# Patient Record
Sex: Female | Born: 1965
Health system: Southern US, Community
[De-identification: ages and names within clinical notes are randomized; demographics above are authoritative.]

## PROBLEM LIST (undated history)

## (undated) ENCOUNTER — Emergency Department (HOSPITAL_COMMUNITY): Payer: Self-pay

## (undated) DIAGNOSIS — F32A Depression, unspecified: Secondary | ICD-10-CM

## (undated) DIAGNOSIS — R600 Localized edema: Secondary | ICD-10-CM

## (undated) DIAGNOSIS — Z973 Presence of spectacles and contact lenses: Secondary | ICD-10-CM

## (undated) DIAGNOSIS — I10 Essential (primary) hypertension: Secondary | ICD-10-CM

## (undated) DIAGNOSIS — R112 Nausea with vomiting, unspecified: Secondary | ICD-10-CM

## (undated) DIAGNOSIS — E538 Deficiency of other specified B group vitamins: Secondary | ICD-10-CM

## (undated) DIAGNOSIS — E559 Vitamin D deficiency, unspecified: Secondary | ICD-10-CM

## (undated) DIAGNOSIS — J189 Pneumonia, unspecified organism: Secondary | ICD-10-CM

## (undated) DIAGNOSIS — M549 Dorsalgia, unspecified: Secondary | ICD-10-CM

## (undated) DIAGNOSIS — Z9889 Other specified postprocedural states: Secondary | ICD-10-CM

## (undated) DIAGNOSIS — R7303 Prediabetes: Secondary | ICD-10-CM

## (undated) HISTORY — DX: Vitamin D deficiency, unspecified: E55.9

## (undated) HISTORY — DX: Essential (primary) hypertension: I10

## (undated) HISTORY — DX: Prediabetes: R73.03

## (undated) HISTORY — DX: Depression, unspecified: F32.A

## (undated) HISTORY — PX: CHOLECYSTECTOMY: SHX55

## (undated) HISTORY — DX: Deficiency of other specified B group vitamins: E53.8

## (undated) HISTORY — DX: Localized edema: R60.0

## (undated) HISTORY — PX: BREAST BIOPSY: SHX20

---
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
## 2001-09-19 ENCOUNTER — Encounter: Admission: RE | Admit: 2001-09-19 | Discharge: 2001-09-19 | Payer: Self-pay | Admitting: *Deleted

## 2004-11-18 ENCOUNTER — Emergency Department (HOSPITAL_COMMUNITY): Admission: AD | Admit: 2004-11-18 | Discharge: 2004-11-18 | Payer: Self-pay | Admitting: Family Medicine

## 2005-11-30 ENCOUNTER — Emergency Department (HOSPITAL_COMMUNITY): Admission: EM | Admit: 2005-11-30 | Discharge: 2005-11-30 | Payer: Self-pay | Admitting: Emergency Medicine

## 2009-01-24 ENCOUNTER — Emergency Department (HOSPITAL_COMMUNITY): Admission: EM | Admit: 2009-01-24 | Discharge: 2009-01-24 | Payer: Self-pay | Admitting: Emergency Medicine

## 2009-02-22 ENCOUNTER — Emergency Department (HOSPITAL_COMMUNITY): Admission: EM | Admit: 2009-02-22 | Discharge: 2009-02-22 | Payer: Self-pay | Admitting: Emergency Medicine

## 2010-10-07 ENCOUNTER — Emergency Department (HOSPITAL_COMMUNITY)
Admission: EM | Admit: 2010-10-07 | Discharge: 2010-10-07 | Disposition: A | Discharge status: Home / Self Care | Payer: Self-pay | Attending: Emergency Medicine | Admitting: Emergency Medicine

## 2010-10-07 DIAGNOSIS — N390 Urinary tract infection, site not specified: Secondary | ICD-10-CM | POA: Insufficient documentation

## 2010-10-07 DIAGNOSIS — R300 Dysuria: Secondary | ICD-10-CM | POA: Insufficient documentation

## 2010-10-07 LAB — URINALYSIS, ROUTINE W REFLEX MICROSCOPIC
Bilirubin Urine: NEGATIVE
Glucose, UA: NEGATIVE mg/dL
Nitrite: NEGATIVE
Protein, ur: 100 mg/dL — AB
Urobilinogen, UA: 4 mg/dL — ABNORMAL HIGH (ref 0.0–1.0)
pH: 7 (ref 5.0–8.0)

## 2010-10-07 LAB — PREGNANCY, URINE: Preg Test, Ur: NEGATIVE

## 2010-10-07 LAB — URINE MICROSCOPIC-ADD ON

## 2010-10-09 LAB — URINE CULTURE

## 2010-12-20 ENCOUNTER — Emergency Department (INDEPENDENT_AMBULATORY_CARE_PROVIDER_SITE_OTHER)
Admission: EM | Admit: 2010-12-20 | Discharge: 2010-12-20 | Disposition: A | Discharge status: Home / Self Care | Payer: Self-pay | Source: Home / Self Care | Attending: Emergency Medicine | Admitting: Emergency Medicine

## 2010-12-20 DIAGNOSIS — IMO0002 Reserved for concepts with insufficient information to code with codable children: Secondary | ICD-10-CM

## 2010-12-20 DIAGNOSIS — J029 Acute pharyngitis, unspecified: Secondary | ICD-10-CM

## 2010-12-20 DIAGNOSIS — M5416 Radiculopathy, lumbar region: Secondary | ICD-10-CM

## 2010-12-20 HISTORY — DX: Dorsalgia, unspecified: M54.9

## 2010-12-20 LAB — POCT RAPID STREP A: Streptococcus, Group A Screen (Direct): NEGATIVE

## 2010-12-20 MED ORDER — PREDNISONE 20 MG PO TABS: 40.0000 mg | ORAL_TABLET | Freq: Every day | ORAL | Status: AC

## 2010-12-20 MED ORDER — ACETAMINOPHEN-CODEINE #3 300-30 MG PO TABS: 1.0000 | ORAL_TABLET | Freq: Four times a day (QID) | ORAL | Status: AC | PRN

## 2010-12-20 NOTE — ED Notes (Signed)
C/o sorethroat for 2 days.  Also c/o low back pain with numbness to rt leg for 2 weeks.  Hx of "bulging disc" in her back.
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
## 2011-01-10 ENCOUNTER — Emergency Department (INDEPENDENT_AMBULATORY_CARE_PROVIDER_SITE_OTHER)
Admission: EM | Admit: 2011-01-10 | Discharge: 2011-01-10 | Disposition: A | Discharge status: Home / Self Care | Payer: Self-pay | Source: Home / Self Care | Attending: Emergency Medicine | Admitting: Emergency Medicine

## 2011-01-10 ENCOUNTER — Encounter (HOSPITAL_COMMUNITY): Payer: Self-pay

## 2011-01-10 ENCOUNTER — Emergency Department (INDEPENDENT_AMBULATORY_CARE_PROVIDER_SITE_OTHER): Payer: Self-pay

## 2011-01-10 DIAGNOSIS — R05 Cough: Secondary | ICD-10-CM

## 2011-01-10 DIAGNOSIS — J040 Acute laryngitis: Secondary | ICD-10-CM

## 2011-01-10 MED ORDER — BENZONATATE 100 MG PO CAPS: 100.0000 mg | ORAL_CAPSULE | Freq: Three times a day (TID) | ORAL | Status: AC

## 2011-01-10 NOTE — ED Notes (Signed)
C/o cough, scratchy throat, "lungs hurt to inhale", and bodyaches for 2 weeks- sx much worse the last week.  Denies known fever.

## 2011-01-10 NOTE — ED Provider Notes (Signed)
History     CSN: [id number] Arrival date & time: 01/10/2011  9:02 AM   First MD Initiated Contact with Patient 01/10/11 1005      Chief Complaint  Patient presents with  . URI    (Consider location/radiation/quality/duration/timing/severity/associated sxs/prior treatment) HPI Comments: X 2 weeks been coughing doing no better, cough not going away" NO sob, still having body aches and a sore throat"  Patient is a 45 y.o. female presenting with URI. The history is provided by the patient.  URI The primary symptoms include sore throat and cough. Primary symptoms do not include fever, wheezing, myalgias or rash. This is a new problem. The problem has not changed since onset. Symptoms associated with the illness include congestion. The illness is not associated with chills or facial pain.    Past Medical History  Diagnosis Date  . Back pain     History reviewed. No pertinent past surgical history.  No family history on file.  History  Substance Use Topics  . Smoking status: Never Smoker   . Smokeless tobacco: Not on file  . Alcohol Use: No    OB History    Grav Para Term Preterm Abortions TAB SAB Ect Mult Living                  Review of Systems  Constitutional: Negative for fever and chills.  HENT: Positive for congestion and sore throat.   Respiratory: Positive for cough. Negative for wheezing.   Musculoskeletal: Negative for myalgias.  Skin: Negative for rash.    Allergies  Review of patient's allergies indicates no known allergies.  Home Medications   Current Outpatient Rx  Name Route Sig Dispense Refill  . BENZONATATE 100 MG PO CAPS Oral Take 1 capsule (100 mg total) by mouth every 8 (eight) hours. 21 capsule 0  . BENZONATATE 100 MG PO CAPS Oral Take 1 capsule (100 mg total) by mouth every 8 (eight) hours. 21 capsule 0  . ALEVE PO Oral Take by mouth.        BP 141/76  Pulse 63  Temp(Src) 98.1 F (36.7 C) (Oral)  Resp 20  SpO2 100%  LMP  01/07/2011  Physical Exam  Nursing note and vitals reviewed. Constitutional: She appears well-developed and well-nourished. No distress.  HENT:  Head: Normocephalic.  Right Ear: No drainage.  Left Ear: No drainage.  Neck: Normal range of motion.  Pulmonary/Chest: Effort normal and breath sounds normal.  Skin: Skin is warm. No abrasion noted.    ED Course  Procedures (including critical care time)  Labs Reviewed - No data to display Dg Chest 2 View  01/10/2011  *RADIOLOGY REPORT*  Clinical Data: Cough and difficulty breathing for 1 week.  CHEST - 2 VIEW  Comparison: None.  Findings: The lungs are clear.  No pneumothorax or effusion.  Heart size is normal.  IMPRESSION: No acute disease.  Original Report Authenticated By: Bernadene Bell. D'ALESSIO, M.D.     1. Cough   2. Laryngitis acute       MDM  Cough- X 2 WEEKS No improvement requesting work note- In no respiratory distress- febrile        Jimmie Molly, MD 01/10/11 1840

## 2011-07-09 ENCOUNTER — Emergency Department (HOSPITAL_COMMUNITY)
Admission: EM | Admit: 2011-07-09 | Discharge: 2011-07-09 | Disposition: A | Discharge status: Home / Self Care | Payer: Self-pay | Attending: Emergency Medicine | Admitting: Emergency Medicine

## 2011-07-09 ENCOUNTER — Encounter (HOSPITAL_COMMUNITY): Payer: Self-pay | Admitting: *Deleted

## 2011-07-09 ENCOUNTER — Emergency Department (HOSPITAL_COMMUNITY): Payer: Self-pay

## 2011-07-09 DIAGNOSIS — D259 Leiomyoma of uterus, unspecified: Secondary | ICD-10-CM | POA: Insufficient documentation

## 2011-07-09 DIAGNOSIS — R109 Unspecified abdominal pain: Secondary | ICD-10-CM | POA: Insufficient documentation

## 2011-07-09 DIAGNOSIS — R10819 Abdominal tenderness, unspecified site: Secondary | ICD-10-CM | POA: Insufficient documentation

## 2011-07-09 DIAGNOSIS — K8020 Calculus of gallbladder without cholecystitis without obstruction: Secondary | ICD-10-CM | POA: Insufficient documentation

## 2011-07-09 LAB — COMPREHENSIVE METABOLIC PANEL
ALT: 7 U/L (ref 0–35)
AST: 11 U/L (ref 0–37)
Albumin: 3.3 g/dL — ABNORMAL LOW (ref 3.5–5.2)
Alkaline Phosphatase: 59 U/L (ref 39–117)
GFR calc Af Amer: 80 mL/min — ABNORMAL LOW (ref >90)
Glucose, Bld: 103 mg/dL — ABNORMAL HIGH (ref 70–99)
Potassium: 3 mEq/L — ABNORMAL LOW (ref 3.5–5.1)
Sodium: 139 mEq/L (ref 135–145)
Total Protein: 6.8 g/dL (ref 6.0–8.3)

## 2011-07-09 LAB — CBC
HCT: 35.6 % — ABNORMAL LOW (ref 36.0–46.0)
Hemoglobin: 11.9 g/dL — ABNORMAL LOW (ref 12.0–15.0)
RDW: 13.4 % (ref 11.5–15.5)
WBC: 7.5 10*3/uL (ref 4.0–10.5)

## 2011-07-09 LAB — URINALYSIS, ROUTINE W REFLEX MICROSCOPIC
Bilirubin Urine: NEGATIVE
Glucose, UA: NEGATIVE mg/dL
Hgb urine dipstick: NEGATIVE
Specific Gravity, Urine: 1.013 (ref 1.005–1.030)
pH: 6 (ref 5.0–8.0)

## 2011-07-09 LAB — DIFFERENTIAL
Basophils Absolute: 0.1 10*3/uL (ref 0.0–0.1)
Basophils Relative: 1 % (ref 0–1)
Lymphocytes Relative: 24 % (ref 12–46)
Monocytes Relative: 18 % — ABNORMAL HIGH (ref 3–12)
Neutro Abs: 4.1 10*3/uL (ref 1.7–7.7)
WBC Morphology: INCREASED

## 2011-07-09 LAB — PREGNANCY, URINE: Preg Test, Ur: NEGATIVE

## 2011-07-09 MED ORDER — OXYCODONE-ACETAMINOPHEN 5-325 MG PO TABS: 1.0000 | ORAL_TABLET | Freq: Four times a day (QID) | ORAL | Status: AC | PRN

## 2011-07-09 MED ORDER — KETOROLAC TROMETHAMINE 30 MG/ML IJ SOLN
30.0000 mg | Freq: Once | INTRAMUSCULAR | Status: AC
  Administered 2011-07-09: 30 mg via INTRAVENOUS
  Filled 2011-07-09: qty 1

## 2011-07-09 MED ORDER — MORPHINE SULFATE 4 MG/ML IJ SOLN
4.0000 mg | Freq: Once | INTRAMUSCULAR | Status: AC
  Administered 2011-07-09: 4 mg via INTRAVENOUS
  Filled 2011-07-09: qty 1

## 2011-07-09 MED ORDER — IOHEXOL 300 MG/ML  SOLN
20.0000 mL | INTRAMUSCULAR | Status: AC
  Administered 2011-07-09 (×2): 20 mL via ORAL

## 2011-07-09 MED ORDER — SODIUM CHLORIDE 0.9 % IV SOLN
Freq: Once | INTRAVENOUS | Status: AC
  Administered 2011-07-09: 09:00:00 via INTRAVENOUS

## 2011-07-09 MED ORDER — IOHEXOL 300 MG/ML  SOLN
100.0000 mL | Freq: Once | INTRAMUSCULAR | Status: AC | PRN
  Administered 2011-07-09: 100 mL via INTRAVENOUS

## 2011-07-09 MED ORDER — ONDANSETRON HCL 4 MG/2ML IJ SOLN
4.0000 mg | Freq: Once | INTRAMUSCULAR | Status: AC
  Administered 2011-07-09: 4 mg via INTRAVENOUS
  Filled 2011-07-09: qty 2

## 2011-07-09 NOTE — ED Notes (Signed)
Pt made aware that urine specimen is needed- specimen cup at bedside.

## 2011-07-09 NOTE — ED Notes (Signed)
Pt reports generalized abdominal pain since Thursday with nausea/vomiting/diarrhea/fever. No appetite. No OTC medications.

## 2011-07-09 NOTE — ED Provider Notes (Signed)
History     CSN: [id number]  Arrival date & time 07/09/11  0854   First MD Initiated Contact with Patient 07/09/11 0902      Chief Complaint  Patient presents with  . Abdominal Pain  . Emesis    (Consider location/radiation/quality/duration/timing/severity/associated sxs/prior treatment) Patient is a 46 y.o. female presenting with abdominal pain. The history is provided by the patient.  Abdominal Pain The primary symptoms of the illness include nausea. The primary symptoms of the illness do not include dysuria, vaginal discharge or vaginal bleeding. Episode onset: 4 days ago. The onset of the illness was gradual. The problem has been gradually worsening.  The patient states that she believes she is currently not pregnant. The patient has had a change in bowel habit. Symptoms associated with the illness do not include heartburn or constipation.    Past Medical History  Diagnosis Date  . Back pain     History reviewed. No pertinent past surgical history.  No family history on file.  History  Substance Use Topics  . Smoking status: Never Smoker   . Smokeless tobacco: Not on file  . Alcohol Use: No    OB History    Grav Para Term Preterm Abortions TAB SAB Ect Mult Living                  Review of Systems  Gastrointestinal: Positive for nausea. Negative for heartburn and constipation.  Genitourinary: Negative for dysuria, vaginal bleeding and vaginal discharge.  All other systems reviewed and are negative.    Allergies  Review of patient's allergies indicates no known allergies.  Home Medications   Current Outpatient Rx  Name Route Sig Dispense Refill  . ALEVE PO Oral Take by mouth.        Pulse 95  Temp 99.3 F (37.4 C) (Oral)  Resp 18  SpO2 96%  LMP 06/18/2011  Physical Exam  Nursing note and vitals reviewed. Constitutional: She is oriented to person, place, and time. She appears well-developed and well-nourished. No distress.  HENT:  Head:  Normocephalic and atraumatic.  Neck: Normal range of motion. Neck supple.  Cardiovascular: Normal rate and regular rhythm.  Exam reveals no gallop and no friction rub.   No murmur heard. Pulmonary/Chest: Effort normal and breath sounds normal. No respiratory distress. She has no wheezes.  Abdominal: Soft. Bowel sounds are normal. She exhibits no distension. There is no tenderness.       There is ttp in the ruq, epigastrium, luq, and llq.  No rebound or guarding is appreciated.  Bowel sounds are normoactive.  Musculoskeletal: Normal range of motion.  Neurological: She is alert and oriented to person, place, and time.  Skin: Skin is warm and dry. She is not diaphoretic.    ED Course  Procedures (including critical care time)   Labs Reviewed  CBC  DIFFERENTIAL  COMPREHENSIVE METABOLIC PANEL  LIPASE, BLOOD  URINALYSIS, ROUTINE W REFLEX MICROSCOPIC  PREGNANCY, URINE   No results found.   No diagnosis found.    MDM  The workup reveals only gallstones on the ct with no wbc, fever, or elevation of liver or pancreatic enzymes.  She is feeling better with the meds given and will be discharged to home.  She will be prescribed pain meds, surgery follow up and return prn if she worsens.        Geoffery Lyons, MD 07/09/11 1501

## 2011-07-09 NOTE — ED Notes (Signed)
MD at bedside. 

## 2011-08-06 ENCOUNTER — Encounter (INDEPENDENT_AMBULATORY_CARE_PROVIDER_SITE_OTHER): Payer: Self-pay | Admitting: Surgery

## 2011-08-06 ENCOUNTER — Ambulatory Visit (INDEPENDENT_AMBULATORY_CARE_PROVIDER_SITE_OTHER): Payer: Self-pay | Admitting: Surgery

## 2011-08-06 ENCOUNTER — Encounter (INDEPENDENT_AMBULATORY_CARE_PROVIDER_SITE_OTHER): Payer: Self-pay

## 2011-08-06 VITALS — BP 118/70 | HR 72 | Temp 97.2°F | Resp 18 | Ht 67.0 in | Wt 231.0 lb

## 2011-08-06 DIAGNOSIS — K8010 Calculus of gallbladder with chronic cholecystitis without obstruction: Secondary | ICD-10-CM | POA: Insufficient documentation

## 2011-08-06 NOTE — Patient Instructions (Signed)
Cholelithiasis Cholelithiasis (also called gallstones) is a form of gallbladder disease where gallstones form in your gallbladder. The gallbladder is a non-essential organ that stores bile made in the liver, which helps digest fats. Gallstones begin as small crystals and slowly grow into stones. Gallstone pain occurs when the gallbladder spasms, and a gallstone is blocking the duct. Pain can also occur when a stone passes out of the duct.  Women are more likely to develop gallstones than men. Other factors that increase the risk of gallbladder disease are:  Having multiple pregnancies. Physicians sometimes advise removing diseased gallbladders before future pregnancies.   Obesity.   Diets heavy in fried foods and fat.   Increasing age (older than 60).   Prolonged use of medications containing female hormones.   Diabetes mellitus.   Rapid weight loss.   Family history of gallstones (heredity).  SYMPTOMS  Feeling sick to your stomach (nauseous).   Abdominal pain.   Yellowing of the skin (jaundice).   Sudden pain. It may persist from several minutes to several hours.   Worsening pain with deep breathing or when jarred.   Fever.   Tenderness to the touch.  In some cases, when gallstones do not move into the bile duct, people have no pain or symptoms. These are called "silent" gallstones. TREATMENT In severe cases, emergency surgery may be required. HOME CARE INSTRUCTIONS   Only take over-the-counter or prescription medicines for pain, discomfort, or fever as directed by your caregiver.   Follow a low-fat diet until seen again. Fat causes the gallbladder to contract, which can result in pain.   Follow up as instructed. Attacks are almost always recurrent and surgery is usually required for permanent treatment.  SEEK IMMEDIATE MEDICAL CARE IF:   Your pain increases and is not controlled by medications.   You have an oral temperature above 102 F (38.9 C), not controlled by  medication.   You develop nausea and vomiting.  MAKE SURE YOU:   Understand these instructions.   Will watch your condition.   Will get help right away if you are not doing well or get worse.  Document Released: 01/04/2005 Document Revised: 12/28/2010 Document Reviewed: 03/09/2010 [employer] Patient Information 2012 [employer]. 

## 2011-08-06 NOTE — Progress Notes (Signed)
Subjective:     Patient ID: Tracy Meyer, female   DOB: Apr 17, 1965, 46 y.o.   MRN: [id number]  HPI  Tracy Meyer  [birth year] [id number]  Patient Care Team: Jearld Lesch as PCP - General (Specialist)  This patient is a 46 y.o.female who presents today for surgical evaluation at the request of Dr. Judd Lien, [facility] ED.   Reason for visit: Right upper quadrant abdominal pain and gallstones, question of biliary etiology.  A pleasant obese female who had an episode of severe pain.  Right upper quadrant.  Radiate to the flank.  Had nausea and vomiting with it.  It seems to happen again with food although milder attacks now.  Usually spicy foods, too.  She tends to avoid greasy foods but cooked or baked foods bother her.  Fruits and vegetables are fine.  She occasionally gets some low-grade fevers up to 100.  She denies heartburn or reflux.  Denies any history of any gastrointestinal problems.  She did have a grandfather with colon cancer in his 92s.  Not in her parents or siblings.  She's never had a colonoscopy.  Patient Active Problem List  Diagnosis  . Chronic cholecystitis with calculus    No past medical history on file.  Past Surgical History  Procedure Date  . Breast biopsy 46 years old    cyst removal    History   Social History  . Marital Status: Single    Spouse Name: N/A    Number of Children: N/A  . Years of Education: N/A   Occupational History  . Not on file.   Social History Main Topics  . Smoking status: Never Smoker   . Smokeless tobacco: Not on file  . Alcohol Use: No  . Drug Use: No  . Sexually Active:    Other Topics Concern  . Not on file   Social History Narrative  . No narrative on file    Family History  Problem Relation Age of Onset  . Colon cancer Maternal Grandmother   . Brain cancer Maternal Grandmother     Current Outpatient Prescriptions  Medication Sig Dispense Refill  . naproxen sodium (ANAPROX) 220 MG tablet Take 220 mg by  mouth daily as needed.         No Known Allergies  BP 118/70  Pulse 72  Temp 97.2 F (36.2 C) (Temporal)  Resp 18  Ht 5\' 7"  (1.702 m)  Wt 231 lb (104.781 kg)  BMI 36.18 kg/m2  LMP 05/28/2011  Ct Abdomen Pelvis W Contrast  07/09/2011  *RADIOLOGY REPORT*  Clinical Data: 46 year old female with abdominal and pelvic pain.  CT ABDOMEN AND PELVIS WITH CONTRAST  Technique:  Multidetector CT imaging of the abdomen and pelvis was performed following the standard protocol during bolus administration of intravenous contrast.  Contrast:  100 ml intravenous Omnipaque-300  Comparison: None  Findings: The liver, spleen, adrenal glands, pancreas, and kidneys are unremarkable except for bilateral renal cysts. A tiny gallstone is noted but the remainder the gallbladder is unremarkable.  No free fluid, enlarged lymph nodes, biliary dilation or abdominal aortic aneurysm identified. Shotty mesenteric lymph nodes are present and likely reactive.  The bowel, appendix and bladder are unremarkable.  Multiple uterine fibroids are identified, the largest measuring 5 cm extending off of the right uterine body/fundus.  No acute or suspicious bony abnormalities are identified.  IMPRESSION: No evidence of acute abnormality.  Shotty mesenteric lymph nodes - likely reactive.  Cholelithiasis without CT evidence of cholecystitis.  Uterine fibroids.  Original Report Authenticated By: Rosendo Gros, M.D.   '  Review of Systems  Constitutional: Negative for fever, chills, diaphoresis, appetite change and fatigue.  HENT: Negative for ear pain, sore throat, trouble swallowing, neck pain and ear discharge.   Eyes: Negative for photophobia, discharge and visual disturbance.  Respiratory: Negative for cough, choking, chest tightness and shortness of breath.   Cardiovascular: Negative for chest pain and palpitations.  Gastrointestinal: Positive for nausea, vomiting, abdominal pain and diarrhea. Negative for constipation, anal  bleeding and rectal pain.       No personal nor family history of GI/colon cancer aside from GF w colon CA in 70s, inflammatory bowel disease, irritable bowel syndrome, allergy such as Celiac Sprue, dietary/dairy problems, colitis, ulcers nor gastritis.    No recent sick contacts/gastroenteritis.  No travel outside the country.  No changes in diet.    Genitourinary: Negative for dysuria, frequency and difficulty urinating.  Musculoskeletal: Positive for myalgias and back pain. Negative for gait problem.  Skin: Negative for color change, pallor and rash.  Neurological: Negative for dizziness, speech difficulty, weakness and numbness.  Hematological: Negative for adenopathy.  Psychiatric/Behavioral: Negative for confusion and agitation. The patient is not nervous/anxious.        Objective:   Physical Exam  Constitutional: She is oriented to person, place, and time. She appears well-developed and well-nourished. No distress.  HENT:  Head: Normocephalic.  Mouth/Throat: Oropharynx is clear and moist. No oropharyngeal exudate.  Eyes: Conjunctivae and EOM are normal. Pupils are equal, round, and reactive to light. No scleral icterus.  Neck: Normal range of motion. Neck supple. No tracheal deviation present.  Cardiovascular: Normal rate, regular rhythm and intact distal pulses.   Pulmonary/Chest: Effort normal and breath sounds normal. No respiratory distress. She exhibits no tenderness.  Abdominal: Soft. Bowel sounds are normal. She exhibits no distension and no mass. There is tenderness in the right upper quadrant. There is no CVA tenderness, no tenderness at McBurney's point and negative Murphy's sign. Hernia confirmed negative in the ventral area, confirmed negative in the right inguinal area and confirmed negative in the left inguinal area.  Genitourinary: No vaginal discharge found.  Musculoskeletal: Normal range of motion. She exhibits no tenderness.  Lymphadenopathy:    She has no  cervical adenopathy.       Right: No inguinal adenopathy present.       Left: No inguinal adenopathy present.  Neurological: She is alert and oriented to person, place, and time. No cranial nerve deficit. She exhibits normal muscle tone. Coordination normal.  Skin: Skin is warm and dry. No rash noted. She is not diaphoretic. No erythema.  Psychiatric: She has a normal mood and affect. Her behavior is normal. Judgment and thought content normal.       Assessment:     Bilary colic with chronic pain,prob chronic cholecystitis    Plan:     Lap chole.  Obese but OK to try single site:  The anatomy & physiology of hepatobiliary & pancreatic function was discussed.  The pathophysiology of gallbladder dysfunction was discussed.  Natural history risks without surgery was discussed.   I feel the risks of no intervention will lead to serious problems that outweigh the operative risks; therefore, I recommended cholecystectomy to remove the pathology.  I explained laparoscopic techniques with possible need for an open approach.  Probable cholangiogram to evaluate the bilary tract was explained as well.    Risks such as bleeding, infection, abscess, leak, injury  to other organs, need for further treatment, heart attack, death, and other risks were discussed.  I noted a good likelihood this will help address the problem.  Possibility that this will not correct all abdominal symptoms was explained.  Goals of post-operative recovery were discussed as well.  We will work to minimize complications.  An educational handout further explaining the pathology and treatment options was given as well.  Questions were answered.  The patient expresses understanding & wishes to proceed with surgery.

## 2011-08-16 ENCOUNTER — Encounter (HOSPITAL_COMMUNITY): Payer: Self-pay | Admitting: Respiratory Therapy

## 2011-08-20 NOTE — Pre-Procedure Instructions (Signed)
[address]  08/20/2011   Your procedure is scheduled on:  Tuesday, August 6th.  Report to [facility] at 12:30PM  Call this number if you have problems the morning of surgery: [phone]   Remember:   Do not eat food or drink any liquid After Midnight.     Take these medicines the morning of surgery with A SIP OF WATER: None Stop taking Aspirin, NSAIDs (Naproxen, Advil, Ibuprophen), Coumadin, Plavix, Effient and Herbal medications.   Do not wear jewelry, make-up or nail polish.  Do not wear lotions, powders, or perfumes. You may wear deodorant.  Do not shave 48 hours prior to surgery. Men may shave face and neck.  Do not bring valuables to the hospital.  Contacts, dentures or bridgework may not be worn into surgery.  Leave suitcase in the car. After surgery it may be brought to your room.  For patients admitted to the hospital, checkout time is 11:00 AM the day of discharge.   Patients discharged the day of surgery will not be allowed to drive home.  Name and phone number of your driver:   Special Instructions: CHG Shower Use Special Wash: 1/2 bottle night before surgery and 1/2 bottle morning of surgery.   Please read over the following fact sheets that you were given: Pain Booklet, Coughing and Deep Breathing and Surgical Site Infection Prevention

## 2011-08-21 ENCOUNTER — Other Ambulatory Visit (HOSPITAL_COMMUNITY): Payer: Self-pay

## 2011-08-21 ENCOUNTER — Encounter (HOSPITAL_COMMUNITY): Payer: Self-pay

## 2011-08-21 ENCOUNTER — Encounter (HOSPITAL_COMMUNITY)
Admission: RE | Admit: 2011-08-21 | Discharge: 2011-08-21 | Disposition: A | Discharge status: Home / Self Care | Payer: Self-pay | Source: Ambulatory Visit | Attending: Surgery | Admitting: Surgery

## 2011-08-21 LAB — CBC
HCT: 36.2 % (ref 36.0–46.0)
Hemoglobin: 12 g/dL (ref 12.0–15.0)
MCH: 30.9 pg (ref 26.0–34.0)
MCHC: 33.1 g/dL (ref 30.0–36.0)
MCV: 93.3 fL (ref 78.0–100.0)

## 2011-08-21 LAB — BASIC METABOLIC PANEL
BUN: 10 mg/dL (ref 6–23)
CO2: 28 mEq/L (ref 19–32)
Calcium: 8.8 mg/dL (ref 8.4–10.5)
Creatinine, Ser: 0.85 mg/dL (ref 0.50–1.10)
Glucose, Bld: 97 mg/dL (ref 70–99)

## 2011-08-21 MED ORDER — CHLORHEXIDINE GLUCONATE 4 % EX LIQD: 1.0000 "application " | Freq: Once | CUTANEOUS | Status: DC

## 2011-08-28 ENCOUNTER — Encounter (HOSPITAL_COMMUNITY): Payer: Self-pay | Admitting: Anesthesiology

## 2011-08-28 ENCOUNTER — Ambulatory Visit (HOSPITAL_COMMUNITY)
Admission: RE | Admit: 2011-08-28 | Discharge: 2011-08-28 | Disposition: A | Discharge status: Home / Self Care | Payer: Self-pay | Source: Ambulatory Visit | Attending: Surgery | Admitting: Surgery

## 2011-08-28 ENCOUNTER — Ambulatory Visit (HOSPITAL_COMMUNITY): Payer: Self-pay | Admitting: Anesthesiology

## 2011-08-28 ENCOUNTER — Encounter (HOSPITAL_COMMUNITY)
Admission: RE | Disposition: A | Discharge status: Home / Self Care | Payer: Self-pay | Source: Ambulatory Visit | Attending: Surgery

## 2011-08-28 ENCOUNTER — Encounter (HOSPITAL_COMMUNITY): Payer: Self-pay | Admitting: Surgery

## 2011-08-28 DIAGNOSIS — Z01812 Encounter for preprocedural laboratory examination: Secondary | ICD-10-CM | POA: Insufficient documentation

## 2011-08-28 DIAGNOSIS — K8010 Calculus of gallbladder with chronic cholecystitis without obstruction: Secondary | ICD-10-CM | POA: Insufficient documentation

## 2011-08-28 SURGERY — LAPAROSCOPIC CHOLECYSTECTOMY SINGLE SITE
Anesthesia: General | Site: Abdomen | Wound class: Clean Contaminated

## 2011-08-28 MED ORDER — HYDROMORPHONE HCL PF 1 MG/ML IJ SOLN
INTRAMUSCULAR | Status: AC
  Administered 2011-08-28: 0.5 mg via INTRAVENOUS
  Filled 2011-08-28: qty 1

## 2011-08-28 MED ORDER — LACTATED RINGERS IV SOLN
INTRAVENOUS | Status: DC
  Administered 2011-08-28: 14:00:00 via INTRAVENOUS

## 2011-08-28 MED ORDER — GLYCOPYRROLATE 0.2 MG/ML IJ SOLN
INTRAMUSCULAR | Status: DC | PRN
  Administered 2011-08-28: 0.6 mg via INTRAVENOUS

## 2011-08-28 MED ORDER — LIDOCAINE HCL (CARDIAC) 20 MG/ML IV SOLN
INTRAVENOUS | Status: DC | PRN
  Administered 2011-08-28: 100 mg via INTRAVENOUS

## 2011-08-28 MED ORDER — FENTANYL CITRATE 0.05 MG/ML IJ SOLN
INTRAMUSCULAR | Status: DC | PRN
  Administered 2011-08-28: 50 ug via INTRAVENOUS
  Administered 2011-08-28: 100 ug via INTRAVENOUS

## 2011-08-28 MED ORDER — SODIUM CHLORIDE 0.9 % IR SOLN
Status: DC | PRN
  Administered 2011-08-28: 1000 mL

## 2011-08-28 MED ORDER — ONDANSETRON HCL 4 MG/2ML IJ SOLN: 4.0000 mg | Freq: Once | INTRAMUSCULAR | Status: DC | PRN

## 2011-08-28 MED ORDER — ACETAMINOPHEN 650 MG RE SUPP: 650.0000 mg | RECTAL | Status: DC | PRN

## 2011-08-28 MED ORDER — ONDANSETRON HCL 4 MG/2ML IJ SOLN
INTRAMUSCULAR | Status: DC | PRN
  Administered 2011-08-28: 4 mg via INTRAVENOUS

## 2011-08-28 MED ORDER — SODIUM CHLORIDE 0.9 % IJ SOLN: 3.0000 mL | INTRAMUSCULAR | Status: DC | PRN

## 2011-08-28 MED ORDER — PROPOFOL 10 MG/ML IV EMUL
INTRAVENOUS | Status: DC | PRN
  Administered 2011-08-28: 30 mg via INTRAVENOUS
  Administered 2011-08-28: 140 mg via INTRAVENOUS

## 2011-08-28 MED ORDER — OXYCODONE HCL 5 MG PO TABS
5.0000 mg | ORAL_TABLET | ORAL | Status: DC | PRN
  Administered 2011-08-28: 5 mg via ORAL

## 2011-08-28 MED ORDER — NAPROXEN SODIUM 220 MG PO TABS: 440.0000 mg | ORAL_TABLET | Freq: Two times a day (BID) | ORAL | Status: DC | PRN

## 2011-08-28 MED ORDER — CEFAZOLIN SODIUM-DEXTROSE 2-3 GM-% IV SOLR
INTRAVENOUS | Status: AC
  Filled 2011-08-28: qty 50

## 2011-08-28 MED ORDER — LACTATED RINGERS IV SOLN
INTRAVENOUS | Status: DC | PRN
  Administered 2011-08-28 (×2): via INTRAVENOUS

## 2011-08-28 MED ORDER — MIDAZOLAM HCL 5 MG/5ML IJ SOLN
INTRAMUSCULAR | Status: DC | PRN
  Administered 2011-08-28 (×2): 1 mg via INTRAVENOUS

## 2011-08-28 MED ORDER — ONDANSETRON HCL 4 MG/2ML IJ SOLN: 4.0000 mg | Freq: Four times a day (QID) | INTRAMUSCULAR | Status: DC | PRN

## 2011-08-28 MED ORDER — SODIUM CHLORIDE 0.9 % IJ SOLN: 3.0000 mL | Freq: Two times a day (BID) | INTRAMUSCULAR | Status: DC

## 2011-08-28 MED ORDER — BUPIVACAINE-EPINEPHRINE PF 0.25-1:200000 % IJ SOLN
INTRAMUSCULAR | Status: AC
  Filled 2011-08-28: qty 30

## 2011-08-28 MED ORDER — SODIUM CHLORIDE 0.9 % IV SOLN: 250.0000 mL | INTRAVENOUS | Status: DC | PRN

## 2011-08-28 MED ORDER — ACETAMINOPHEN 325 MG PO TABS: 650.0000 mg | ORAL_TABLET | ORAL | Status: DC | PRN

## 2011-08-28 MED ORDER — HYDROMORPHONE HCL PF 1 MG/ML IJ SOLN
0.2500 mg | INTRAMUSCULAR | Status: DC | PRN
  Administered 2011-08-28 (×2): 0.5 mg via INTRAVENOUS

## 2011-08-28 MED ORDER — OXYCODONE HCL 5 MG PO TABS
ORAL_TABLET | ORAL | Status: AC
  Administered 2011-08-28: 5 mg via ORAL
  Filled 2011-08-28: qty 1

## 2011-08-28 MED ORDER — ROCURONIUM BROMIDE 100 MG/10ML IV SOLN
INTRAVENOUS | Status: DC | PRN
  Administered 2011-08-28: 50 mg via INTRAVENOUS

## 2011-08-28 MED ORDER — NEOSTIGMINE METHYLSULFATE 1 MG/ML IJ SOLN
INTRAMUSCULAR | Status: DC | PRN
  Administered 2011-08-28: 4 mg via INTRAVENOUS

## 2011-08-28 MED ORDER — FENTANYL CITRATE 0.05 MG/ML IJ SOLN: 25.0000 ug | INTRAMUSCULAR | Status: DC | PRN

## 2011-08-28 MED ORDER — OXYCODONE HCL 5 MG PO TABS: 5.0000 mg | ORAL_TABLET | ORAL | Status: AC | PRN

## 2011-08-28 SURGICAL SUPPLY — 47 items
APPLIER CLIP 5 13 M/L LIGAMAX5 (MISCELLANEOUS) ×2
APR CLP MED LRG 5 ANG JAW (MISCELLANEOUS) ×1
BAG SPEC RTRVL LRG 6X4 10 (ENDOMECHANICALS)
BLADE SURG ROTATE 9660 (MISCELLANEOUS) IMPLANT
CANISTER SUCTION 2500CC (MISCELLANEOUS) ×2 IMPLANT
CHLORAPREP W/TINT 26ML (MISCELLANEOUS) ×2 IMPLANT
CLIP APPLIE 5 13 M/L LIGAMAX5 (MISCELLANEOUS) ×1 IMPLANT
CLOTH BEACON ORANGE TIMEOUT ST (SAFETY) ×2 IMPLANT
COVER MAYO STAND STRL (DRAPES) IMPLANT
COVER SURGICAL LIGHT HANDLE (MISCELLANEOUS) ×2 IMPLANT
DECANTER SPIKE VIAL GLASS SM (MISCELLANEOUS) ×2 IMPLANT
DRAPE C-ARM 42X72 X-RAY (DRAPES) IMPLANT
DRAPE WARM FLUID 44X44 (DRAPE) ×2 IMPLANT
DRSG TEGADERM 4X4.75 (GAUZE/BANDAGES/DRESSINGS) ×2 IMPLANT
ELECT REM PT RETURN 9FT ADLT (ELECTROSURGICAL) ×2
ELECTRODE REM PT RTRN 9FT ADLT (ELECTROSURGICAL) ×1 IMPLANT
ENDOLOOP SUT PDS II  0 18 (SUTURE)
ENDOLOOP SUT PDS II 0 18 (SUTURE) IMPLANT
GAUZE SPONGE 2X2 8PLY STRL LF (GAUZE/BANDAGES/DRESSINGS) ×1 IMPLANT
GLOVE BIOGEL PI IND STRL 7.5 (GLOVE) IMPLANT
GLOVE BIOGEL PI IND STRL 8 (GLOVE) ×1 IMPLANT
GLOVE BIOGEL PI INDICATOR 7.5 (GLOVE) ×2
GLOVE BIOGEL PI INDICATOR 8 (GLOVE) ×1
GLOVE ECLIPSE 8.0 STRL XLNG CF (GLOVE) ×2 IMPLANT
GOWN PREVENTION PLUS XLARGE (GOWN DISPOSABLE) ×2 IMPLANT
GOWN STRL NON-REIN LRG LVL3 (GOWN DISPOSABLE) ×4 IMPLANT
KIT BASIN OR (CUSTOM PROCEDURE TRAY) ×2 IMPLANT
KIT ROOM TURNOVER OR (KITS) ×2 IMPLANT
NEEDLE 22X1 1/2 (OR ONLY) (NEEDLE) ×2 IMPLANT
NS IRRIG 1000ML POUR BTL (IV SOLUTION) ×2 IMPLANT
PAD ARMBOARD 7.5X6 YLW CONV (MISCELLANEOUS) ×4 IMPLANT
POUCH SPECIMEN RETRIEVAL 10MM (ENDOMECHANICALS) IMPLANT
SCALPEL HARMONIC ACE (MISCELLANEOUS) ×2 IMPLANT
SCISSORS LAP 5X35 DISP (ENDOMECHANICALS) ×1 IMPLANT
SET CHOLANGIOGRAPH 5 50 .035 (SET/KITS/TRAYS/PACK) IMPLANT
SET IRRIG TUBING LAPAROSCOPIC (IRRIGATION / IRRIGATOR) ×2 IMPLANT
SPECIMEN JAR SMALL (MISCELLANEOUS) ×2 IMPLANT
SPONGE GAUZE 2X2 STER 10/PKG (GAUZE/BANDAGES/DRESSINGS) ×1
SUT MNCRL AB 4-0 PS2 18 (SUTURE) ×2 IMPLANT
SUT VICRYL 0 TIES 12 18 (SUTURE) IMPLANT
TOWEL OR 17X26 10 PK STRL BLUE (TOWEL DISPOSABLE) ×2 IMPLANT
TRAY LAPAROSCOPIC (CUSTOM PROCEDURE TRAY) ×2 IMPLANT
TROCAR 5M 150ML BLDLS (TROCAR) IMPLANT
TROCAR FALLER TUNNELING (TROCAR) ×2 IMPLANT
TROCAR XCEL NON-BLD 5MMX100MML (ENDOMECHANICALS) IMPLANT
TROCAR Z-THREAD FIOS 5X100MM (TROCAR) ×2 IMPLANT
WATER STERILE IRR 1000ML POUR (IV SOLUTION) IMPLANT

## 2011-08-28 NOTE — Anesthesia Postprocedure Evaluation (Signed)
Anesthesia Post Note  Patient: Tracy Meyer  Procedure(s) Performed: Procedure(s) (LRB): LAPAROSCOPIC CHOLECYSTECTOMY SINGLE PORT (N/A)  Anesthesia type: general  Patient location: PACU  Post pain: Pain level controlled  Post assessment: Patient's Cardiovascular Status Stable  Last Vitals:  Filed Vitals:   08/28/11 1715  BP: 134/78  Pulse: 87  Temp: 37.1 C  Resp: 22    Post vital signs: Reviewed and stable  Level of consciousness: sedated  Complications: No apparent anesthesia complications

## 2011-08-28 NOTE — Anesthesia Preprocedure Evaluation (Addendum)
Anesthesia Evaluation  Patient identified by MRN, date of birth, ID band Patient awake    Reviewed: Allergy & Precautions, H&P , NPO status , Patient's Chart, lab work & pertinent test results  Airway Mallampati: I TM Distance: >3 FB Neck ROM: Full    Dental  (+) Teeth Intact and Dental Advisory Given   Pulmonary          Cardiovascular     Neuro/Psych    GI/Hepatic   Endo/Other    Renal/GU      Musculoskeletal   Abdominal   Peds  Hematology   Anesthesia Other Findings   Reproductive/Obstetrics                          Anesthesia Physical Anesthesia Plan  ASA: II  Anesthesia Plan: General   Post-op Pain Management:    Induction: Intravenous  Airway Management Planned: Oral ETT  Additional Equipment:   Intra-op Plan:   Post-operative Plan: Extubation in OR  Informed Consent: I have reviewed the patients History and Physical, chart, labs and discussed the procedure including the risks, benefits and alternatives for the proposed anesthesia with the patient or authorized representative who has indicated his/her understanding and acceptance.     Plan Discussed with: CRNA and Surgeon  Anesthesia Plan Comments:         Anesthesia Quick Evaluation

## 2011-08-28 NOTE — Op Note (Signed)
08/28/2011  4:56 PM  PATIENT:  Tracy Meyer  46 y.o. female  Patient Care Team: Jearld Lesch as PCP - General (Specialist)  PRE-OPERATIVE DIAGNOSIS:  chronic cholecystitis with calculus  POST-OPERATIVE DIAGNOSIS:  chronic cholecystitis with calculus  PROCEDURE:  Procedure(s): LAPAROSCOPIC CHOLECYSTECTOMY SINGLE PORT  SURGEON:  Surgeon(s): Ardeth Sportsman, MD  ASSISTANT: none   ANESTHESIA:   local and general  EBL:     Delay start of Pharmacological VTE agent (>24hrs) due to surgical blood loss or risk of bleeding:  no  DRAINS: none   SPECIMEN:  Source of Specimen:  Gallbladder  DISPOSITION OF SPECIMEN:  PATHOLOGY  COUNTS:  YES  PLAN OF CARE: Discharge to home after PACU  PATIENT DISPOSITION:  PACU - hemodynamically stable.  INDICATION: Obese, pleasant female with biliary colic.  W/u otherwise negative.  I recommended cholecystectomy:  The anatomy & physiology of hepatobiliary & pancreatic function was discussed.  The pathophysiology of gallbladder dysfunction was discussed.  Natural history risks without surgery was discussed.   I feel the risks of no intervention will lead to serious problems that outweigh the operative risks; therefore, I recommended cholecystectomy to remove the pathology.  I explained laparoscopic techniques with possible need for an open approach.  Probable cholangiogram to evaluate the bilary tract was explained as well.    Risks such as bleeding, infection, abscess, leak, injury to other organs, need for further treatment, heart attack, death, and other risks were discussed.  I noted a good likelihood this will help address the problem.  Possibility that this will not correct all abdominal symptoms was explained.  Goals of post-operative recovery were discussed as well.  We will work to minimize complications.  An educational handout further explaining the pathology and treatment options was given as well.  Questions were answered.  The  patient expresses understanding & wishes to proceed with surgery.  OR FINDINGS: Thickened grey GB wall c/w chronic cholecystitis  DESCRIPTION:   The patient was identified & brought in the operating room. The patient was positioned supine with arms tucked. SCDs were active during the entire case. The patient underwent general anesthesia without any difficulty.  The abdomen was prepped and draped in a sterile fashion. A Surgical Timeout confirmed our plan.  I made a transverse curvilinear incision through the superior umbilical fold.  I placed a 5mm long port through the supraumbilical fascia using a modified Hassan cutdown technique. I began carbon dioxide insufflation. Camera inspection revealed no injury. There were no adhesions to the anterior abdominal wall supraumbilically.  I proceeded to continue with single site technique. I placed a #5 port in left upper aspect of the wound. I placed a 5 mm atraumatic grasper in the right inferior aspect of the wound.  I turned attention to the right upper quadrant.  I freed moderate omental adhesions to the anterior gallbladder.  The gallbladder fundus was elevated cephalad. I freed the peritoneal coverings between the gallbladder and the liver on the posteriolateral and anteriomedial walls. I alternated between Harmonic & blunt Maryland dissection to help get a good critical view of the cystic artery and cystic duct. I did further dissection to free a few centimeters of the  gallbladder off the liver bed to get a good critical view of the infundibulum and cystic duct. I mobilized the cystic artery; and, after getting a good 360 view, ligated the cystic artery using the Harmonic ultrasonic dissection. I skeletonized the cystic duct.  I placed a clip on the  infundibulum. I did a partial cystic duct-otomy and ensured patency. I placed a 5 [place] cholangiocatheter through a puncture site at the right subcostal ridge of the abdominal wall and directed it into the  cystic duct.  I could not get the catheter to advance, however.  The cystic duct was rather narrow & short.  I aborted cholangiogram & removed the cholangiocatheter. I placed clips on the cystic duct x4.  I completed cystic duct transection. I freed the gallbladder from its remaining attachments to the liver. I ensured hemostasis on the gallbladder fossa of the liver and elsewhere. I inspected the rest of the abdomen & detected no injury nor bleeding elsewhere.  I removed the gallbladder out the supraumbilical fascia. I closed the fascia transversely using 0 Vicryl interrupted stitches. A closed the skin using 4-0 monocryl stitch.  Sterile dressing was applied. The patient was extubated & arrived in the PACU in stable condition..  I had discussed postoperative care with the patient & her mother in the holding area.  I am about to locate the patient's family and discuss operative findings and postoperative goals / instructions.  Instructions are written in the chart as well.

## 2011-08-28 NOTE — Preoperative (Signed)
Beta Blockers   Reason not to administer Beta Blockers:Not Applicable 

## 2011-08-28 NOTE — Anesthesia Procedure Notes (Signed)
Procedure Name: Intubation Date/Time: 08/28/2011 3:55 PM Performed by: Marni Griffon Pre-anesthesia Checklist: Patient identified, Emergency Drugs available, Suction available and Patient being monitored Patient Re-evaluated:Patient Re-evaluated prior to inductionOxygen Delivery Method: Circle system utilized Preoxygenation: Pre-oxygenation with 100% oxygen Intubation Type: IV induction Ventilation: Mask ventilation without difficulty Laryngoscope Size: Mac and 3 (could have used the #4) Grade View: Grade III Tube type: Oral Tube size: 7.5 mm Number of attempts: 1 Airway Equipment and Method: Stylet Placement Confirmation: ETT inserted through vocal cords under direct vision,  breath sounds checked- equal and bilateral and positive ETCO2 Secured at: 23 (cm at teeth) cm Tube secured with: Tape Dental Injury: Teeth and Oropharynx as per pre-operative assessment

## 2011-08-28 NOTE — H&P (View-Only) (Signed)
Subjective:     Patient ID: Tracy Meyer, female   DOB: 11/17/1965, 46 y.o.   MRN: [id number]  HPI  Tracy Meyer  02/07/1965 [id number]  Patient Care Team: Dwight M Williams as PCP - General (Specialist)  This patient is a 46 y.o.female who presents today for surgical evaluation at the request of Dr. Delo, [facility].   Reason for visit: Right upper quadrant abdominal pain and gallstones, question of biliary etiology.  A pleasant obese female who had an episode of severe pain.  Right upper quadrant.  Radiate to the flank.  Had nausea and vomiting with it.  It seems to happen again with food although milder attacks now.  Usually spicy foods, too.  She tends to avoid greasy foods but cooked or baked foods bother her.  Fruits and vegetables are fine.  She occasionally gets some low-grade fevers up to 100.  She denies heartburn or reflux.  Denies any history of any gastrointestinal problems.  She did have a grandfather with colon cancer in his 70s.  Not in her parents or siblings.  She's never had a colonoscopy.  Patient Active Problem List  Diagnosis  . Chronic cholecystitis with calculus    No past medical history on file.  Past Surgical History  Procedure Date  . Breast biopsy 46 years old    cyst removal    History   Social History  . Marital Status: Single    Spouse Name: N/A    Number of Children: N/A  . Years of Education: N/A   Occupational History  . Not on file.   Social History Main Topics  . Smoking status: Never Smoker   . Smokeless tobacco: Not on file  . Alcohol Use: No  . Drug Use: No  . Sexually Active:    Other Topics Concern  . Not on file   Social History Narrative  . No narrative on file    Family History  Problem Relation Age of Onset  . Colon cancer Maternal Grandmother   . Brain cancer Maternal Grandmother     Current Outpatient Prescriptions  Medication Sig Dispense Refill  . naproxen sodium (ANAPROX) 220 MG tablet Take 220 mg by  mouth daily as needed.         No Known Allergies  BP 118/70  Pulse 72  Temp 97.2 F (36.2 C) (Temporal)  Resp 18  Ht 5' 7" (1.702 m)  Wt 231 lb (104.781 kg)  BMI 36.18 kg/m2  LMP 05/28/2011  Ct Abdomen Pelvis W Contrast  07/09/2011  *RADIOLOGY REPORT*  Clinical Data: 46-year-old female with abdominal and pelvic pain.  CT ABDOMEN AND PELVIS WITH CONTRAST  Technique:  Multidetector CT imaging of the abdomen and pelvis was performed following the standard protocol during bolus administration of intravenous contrast.  Contrast:  100 ml intravenous Omnipaque-300  Comparison: None  Findings: The liver, spleen, adrenal glands, pancreas, and kidneys are unremarkable except for bilateral renal cysts. A tiny gallstone is noted but the remainder the gallbladder is unremarkable.  No free fluid, enlarged lymph nodes, biliary dilation or abdominal aortic aneurysm identified. Shotty mesenteric lymph nodes are present and likely reactive.  The bowel, appendix and bladder are unremarkable.  Multiple uterine fibroids are identified, the largest measuring 5 cm extending off of the right uterine body/fundus.  No acute or suspicious bony abnormalities are identified.  IMPRESSION: No evidence of acute abnormality.  Shotty mesenteric lymph nodes - likely reactive.  Cholelithiasis without CT evidence of cholecystitis.    Uterine fibroids.  Original Report Authenticated By: JEFFREY T. HU, M.D.   '  Review of Systems  Constitutional: Negative for fever, chills, diaphoresis, appetite change and fatigue.  HENT: Negative for ear pain, sore throat, trouble swallowing, neck pain and ear discharge.   Eyes: Negative for photophobia, discharge and visual disturbance.  Respiratory: Negative for cough, choking, chest tightness and shortness of breath.   Cardiovascular: Negative for chest pain and palpitations.  Gastrointestinal: Positive for nausea, vomiting, abdominal pain and diarrhea. Negative for constipation, anal  bleeding and rectal pain.       No personal nor family history of GI/colon cancer aside from GF w colon CA in 70s, inflammatory bowel disease, irritable bowel syndrome, allergy such as Celiac Sprue, dietary/dairy problems, colitis, ulcers nor gastritis.    No recent sick contacts/gastroenteritis.  No travel outside the country.  No changes in diet.    Genitourinary: Negative for dysuria, frequency and difficulty urinating.  Musculoskeletal: Positive for myalgias and back pain. Negative for gait problem.  Skin: Negative for color change, pallor and rash.  Neurological: Negative for dizziness, speech difficulty, weakness and numbness.  Hematological: Negative for adenopathy.  Psychiatric/Behavioral: Negative for confusion and agitation. The patient is not nervous/anxious.        Objective:   Physical Exam  Constitutional: She is oriented to person, place, and time. She appears well-developed and well-nourished. No distress.  HENT:  Head: Normocephalic.  Mouth/Throat: Oropharynx is clear and moist. No oropharyngeal exudate.  Eyes: Conjunctivae and EOM are normal. Pupils are equal, round, and reactive to light. No scleral icterus.  Neck: Normal range of motion. Neck supple. No tracheal deviation present.  Cardiovascular: Normal rate, regular rhythm and intact distal pulses.   Pulmonary/Chest: Effort normal and breath sounds normal. No respiratory distress. She exhibits no tenderness.  Abdominal: Soft. Bowel sounds are normal. She exhibits no distension and no mass. There is tenderness in the right upper quadrant. There is no CVA tenderness, no tenderness at McBurney's point and negative Murphy's sign. Hernia confirmed negative in the ventral area, confirmed negative in the right inguinal area and confirmed negative in the left inguinal area.  Genitourinary: No vaginal discharge found.  Musculoskeletal: Normal range of motion. She exhibits no tenderness.  Lymphadenopathy:    She has no  cervical adenopathy.       Right: No inguinal adenopathy present.       Left: No inguinal adenopathy present.  Neurological: She is alert and oriented to person, place, and time. No cranial nerve deficit. She exhibits normal muscle tone. Coordination normal.  Skin: Skin is warm and dry. No rash noted. She is not diaphoretic. No erythema.  Psychiatric: She has a normal mood and affect. Her behavior is normal. Judgment and thought content normal.       Assessment:     Bilary colic with chronic pain,prob chronic cholecystitis    Plan:     Lap chole.  Obese but OK to try single site:  The anatomy & physiology of hepatobiliary & pancreatic function was discussed.  The pathophysiology of gallbladder dysfunction was discussed.  Natural history risks without surgery was discussed.   I feel the risks of no intervention will lead to serious problems that outweigh the operative risks; therefore, I recommended cholecystectomy to remove the pathology.  I explained laparoscopic techniques with possible need for an open approach.  Probable cholangiogram to evaluate the bilary tract was explained as well.    Risks such as bleeding, infection, abscess, leak, injury   to other organs, need for further treatment, heart attack, death, and other risks were discussed.  I noted a good likelihood this will help address the problem.  Possibility that this will not correct all abdominal symptoms was explained.  Goals of post-operative recovery were discussed as well.  We will work to minimize complications.  An educational handout further explaining the pathology and treatment options was given as well.  Questions were answered.  The patient expresses understanding & wishes to proceed with surgery.        

## 2011-08-28 NOTE — Anesthesia Postprocedure Evaluation (Signed)
Anesthesia Post Note  Patient: Tracy Meyer  Procedure(s) Performed: Procedure(s) (LRB): LAPAROSCOPIC CHOLECYSTECTOMY SINGLE PORT (N/A)  Anesthesia type: general  Patient location: PACU  Post pain: Pain level controlled  Post assessment: Patient's Cardiovascular Status Stable  Last Vitals:  Filed Vitals:   08/28/11 1257  BP: 147/88  Pulse: 68  Temp: 36.8 C  Resp: 18    Post vital signs: Reviewed and stable  Level of consciousness: sedated  Complications: No apparent anesthesia complications

## 2011-08-28 NOTE — Transfer of Care (Signed)
Immediate Anesthesia Transfer of Care Note  Patient: Tracy Meyer  Procedure(s) Performed: Procedure(s) (LRB): LAPAROSCOPIC CHOLECYSTECTOMY SINGLE PORT (N/A)  Patient Location: PACU  Anesthesia Type: General  Level of Consciousness: sedated  Airway & Oxygen Therapy: Patient Spontanous Breathing and Patient connected to nasal cannula oxygen  Post-op Assessment: Report given to PACU RN, Post -op Vital signs reviewed and stable and Patient moving all extremities  Post vital signs: Reviewed and stable  Complications: No apparent anesthesia complications

## 2011-08-28 NOTE — Interval H&P Note (Signed)
History and Physical Interval Note:  08/28/2011 2:58 PM  Tracy Meyer  has presented today for surgery, with the diagnosis of chronic cholelititis  The various methods of treatment have been discussed with the patient and family. After consideration of risks, benefits and other options for treatment, the patient has consented to  Procedure(s) (LRB): LAPAROSCOPIC CHOLECYSTECTOMY SINGLE PORT (N/A) as a surgical intervention .  The patient's history has been reviewed, patient examined, no change in status, stable for surgery.  I have reviewed the patient's chart and labs.  Questions were answered to the patient's satisfaction.     Indi Willhite C.

## 2011-08-29 ENCOUNTER — Telehealth (INDEPENDENT_AMBULATORY_CARE_PROVIDER_SITE_OTHER): Payer: Self-pay | Admitting: General Surgery

## 2011-08-29 ENCOUNTER — Encounter (INDEPENDENT_AMBULATORY_CARE_PROVIDER_SITE_OTHER): Payer: Self-pay | Admitting: General Surgery

## 2011-08-29 NOTE — Telephone Encounter (Signed)
Pt is POD 1 from lap chole; called for work note.  Letter generated and FAXd to Janetta HoraDaryll Drown, at [phone].  RTW will be evaluated at postop appt on 09/11/11.

## 2011-09-11 ENCOUNTER — Ambulatory Visit (INDEPENDENT_AMBULATORY_CARE_PROVIDER_SITE_OTHER): Payer: Self-pay | Admitting: Surgery

## 2011-09-11 ENCOUNTER — Encounter (INDEPENDENT_AMBULATORY_CARE_PROVIDER_SITE_OTHER): Payer: Self-pay | Admitting: Surgery

## 2011-09-11 VITALS — BP 138/86 | HR 68 | Temp 97.2°F | Resp 16 | Ht 66.0 in | Wt 230.2 lb

## 2011-09-11 DIAGNOSIS — K8010 Calculus of gallbladder with chronic cholecystitis without obstruction: Secondary | ICD-10-CM

## 2011-09-11 NOTE — Patient Instructions (Signed)
Managing Pain  Pain after surgery or related to activity is often due to strain/injury to muscle, tendon, nerves and/or incisions.  This pain is usually short-term and will improve in a few months.   Many people find it helpful to do the following things TOGETHER to help speed the process of healing and to get back to regular activity more quickly:  1. Avoid heavy physical activity a.  no lifting greater than 20 pounds b. Do not "push through" the pain.  Listen to your body and avoid positions and maneuvers than reproduce the pain c. Walking is okay as tolerated, but go slowly and stop when getting sore.  d. Remember: If it hurts to do it, then don't do it! 2. Take Anti-inflammatory medication  a. Take with food/snack around the clock for 1-2 weeks i. This helps the muscle and nerve tissues become less irritable and calm down faster b. Choose ONE of the following over-the-counter medications: i. Naproxen 220mg tabs (ex. Aleve) 1-2 pills twice a day  ii. Ibuprofen 200mg tabs (ex. Advil, Motrin) 3-4 pills with every meal and just before bedtime iii. Acetaminophen 500mg tabs (Tylenol) 1-2 pills with every meal and just before bedtime 3. Use a Heating pad or Ice/Cold Pack a. 4-6 times a day b. May use warm bath/hottub  or showers 4. Try Gentle Massage and/or Stretching  a. at the area of pain many times a day b. stop if you feel pain - do not overdo it  Try these steps together to help you body heal faster and avoid making things get worse.  Doing just one of these things may not be enough.    If you are not getting better after two weeks or are noticing you are getting worse, contact our office for further advice; we may need to re-evaluate you & see what other things we can do to help.  GETTING TO GOOD BOWEL HEALTH. Irregular bowel habits such as constipation and diarrhea can lead to many problems over time.  Having one soft bowel movement a day is the most important way to prevent  further problems.  The anorectal canal is designed to handle stretching and feces to safely manage our ability to get rid of solid waste (feces, poop, stool) out of our body.  BUT, hard constipated stools can act like ripping concrete bricks and diarrhea can be a burning fire to this very sensitive area of our body, causing inflamed hemorrhoids, anal fissures, increasing risk is perirectal abscesses, abdominal pain/bloating, an making irritable bowel worse.     The goal: ONE SOFT BOWEL MOVEMENT A DAY!  To have soft, regular bowel movements:    Drink at least 8 tall glasses of water a day.     Take plenty of fiber.  Fiber is the undigested part of plant food that passes into the colon, acting s "natures broom" to encourage bowel motility and movement.  Fiber can absorb and hold large amounts of water. This results in a larger, bulkier stool, which is soft and easier to pass. Work gradually over several weeks up to 6 servings a day of fiber (25g a day even more if needed) in the form of: o Vegetables -- Root (potatoes, carrots, turnips), leafy green (lettuce, salad greens, celery, spinach), or cooked high residue (cabbage, broccoli, etc) o Fruit -- Fresh (unpeeled skin & pulp), Dried (prunes, apricots, cherries, etc ),  or stewed ( applesauce)  o Whole grain breads, pasta, etc (whole wheat)  o Bran cereals      Bulking Agents -- This type of water-retaining fiber generally is easily obtained each day by one of the following:  o Psyllium bran -- The psyllium plant is remarkable because its ground seeds can retain so much water. This product is available as Metamucil, Konsyl, Effersyllium, Per Diem Fiber, or the less expensive generic preparation in drug and health food stores. Although labeled a laxative, it really is not a laxative.  o Methylcellulose -- This is another fiber derived from wood which also retains water. It is available as Citrucel. o Polyethylene Glycol - and "artificial" fiber commonly called  Miralax or Glycolax.  It is helpful for people with gassy or bloated feelings with regular fiber o Flax Seed - a less gassy fiber than psyllium   No reading or other relaxing activity while on the toilet. If bowel movements take longer than 5 minutes, you are too constipated   AVOID CONSTIPATION.  High fiber and water intake usually takes care of this.  Sometimes a laxative is needed to stimulate more frequent bowel movements, but    Laxatives are not a good long-term solution as it can wear the colon out. o Osmotics (Milk of Magnesia, Fleets phosphosoda, Magnesium citrate, MiraLax, GoLytely) are safer than  o Stimulants (Senokot, Castor Oil, Dulcolax, Ex Lax)    o Do not take laxatives for more than 7days in a row.    IF SEVERELY CONSTIPATED, try a Bowel Retraining Program: o Do not use laxatives.  o Eat a diet high in roughage, such as bran cereals and leafy vegetables.  o Drink six (6) ounces of prune or apricot juice each morning.  o Eat two (2) large servings of stewed fruit each day.  o Take one (1) heaping tablespoon of a psyllium-based bulking agent twice a day. Use sugar-free sweetener when possible to avoid excessive calories.  o Eat a normal breakfast.  o Set aside 15 minutes after breakfast to sit on the toilet, but do not strain to have a bowel movement.  o If you do not have a bowel movement by the third day, use an enema and repeat the above steps.    Controlling diarrhea o Switch to liquids and simpler foods for a few days to avoid stressing your intestines further. o Avoid dairy products (especially milk & ice cream) for a short time.  The intestines often can lose the ability to digest lactose when stressed. o Avoid foods that cause gassiness or bloating.  Typical foods include beans and other legumes, cabbage, broccoli, and dairy foods.  Every person has some sensitivity to other foods, so listen to our body and avoid those foods that trigger problems for you. o Adding fiber  (Citrucel, Metamucil, psyllium, Miralax) gradually can help thicken stools by absorbing excess fluid and retrain the intestines to act more normally.  Slowly increase the dose over a few weeks.  Too much fiber too soon can backfire and cause cramping & bloating. o Probiotics (such as active yogurt, Align, etc) may help repopulate the intestines and colon with normal bacteria and calm down a sensitive digestive tract.  Most studies show it to be of mild help, though, and such products can be costly. o Medicines:   Bismuth subsalicylate (ex. Kayopectate, Pepto Bismol) every 30 minutes for up to 6 doses can help control diarrhea.  Avoid if pregnant.   Loperamide (Immodium) can slow down diarrhea.  Start with two tablets (4mg total) first and then try one tablet every 6 hours.  Avoid if you   are having fevers or severe pain.  If you are not better or start feeling worse, stop all medicines and call your doctor for advice o Call your doctor if you are getting worse or not better.  Sometimes further testing (cultures, endoscopy, X-ray studies, bloodwork, etc) may be needed to help diagnose and treat the cause of the diarrhea. o  

## 2011-09-11 NOTE — Progress Notes (Signed)
Subjective:     Patient ID: Tracy Meyer, female   DOB: 03-01-1965, 46 y.o.   MRN: [id number]  HPI  [facility]  1965/10/05 [id number]  Patient Care Team: Jearld Lesch as PCP - General (Specialist)  This patient is a 46 y.o.female who presents today for surgical evaluation.   Procedure: Single site laparoscopic cholecystectomy 08/28/2011  Diagnosis Gallbladder - CHRONIC CHOLECYSTITIS. - CHOLELITHIASIS. - ONE BENIGN LYMPH NODE (0/1).  The patient comes in today feeling all right.  Some soreness and pinching when she bends over but mild.  Off narcotics.  Eating well.  Tries to avoid heavier foods that she tends to get loose bowel stools and it "runs right through me" lost some weight but gaining it back.  Energy level better.  Overall much improved.  Wanted to get back to work what wanting to know when it states and she works for [employer] and has to do a lot of heavy lifting  Patient Active Problem List  Diagnosis  . Chronic cholecystitis with calculus    History reviewed. No pertinent past medical history.  Past Surgical History  Procedure Date  . Breast biopsy 46 years old    cyst removal    History   Social History  . Marital Status: Single    Spouse Name: N/A    Number of Children: N/A  . Years of Education: N/A   Occupational History  . Not on file.   Social History Main Topics  . Smoking status: Never Smoker   . Smokeless tobacco: Never Used  . Alcohol Use: No  . Drug Use: No  . Sexually Active: Not on file   Other Topics Concern  . Not on file   Social History Narrative  . No narrative on file    Family History  Problem Relation Age of Onset  . Colon cancer Maternal Grandmother   . Brain cancer Maternal Grandmother     Current Outpatient Prescriptions  Medication Sig Dispense Refill  . COD LIVER OIL PO Take 1 capsule by mouth daily.      . Cyanocobalamin (VITAMIN B-12 PO) Take 1 tablet by mouth daily.      . naproxen sodium (ANAPROX)  220 MG tablet Take 2 tablets (440 mg total) by mouth 2 (two) times daily as needed. For pain  30 tablet  2  . Pyridoxine HCl (VITAMIN B-6 PO) Take 1 tablet by mouth daily.      Marland Kitchen VITAMIN E PO Take 1 capsule by mouth daily.         No Known Allergies  BP 138/86  Pulse 68  Temp 97.2 F (36.2 C) (Temporal)  Resp 16  Ht 5\' 6"  (1.676 m)  Wt 230 lb 3.2 oz (104.418 kg)  BMI 37.16 kg/m2  LMP 07/22/2011  No results found.   Review of Systems  Constitutional: Negative for fever, chills and diaphoresis.  HENT: Negative for ear pain, sore throat and trouble swallowing.   Eyes: Negative for photophobia and visual disturbance.  Respiratory: Negative for cough and choking.   Cardiovascular: Negative for chest pain and palpitations.  Gastrointestinal: Positive for abdominal pain and diarrhea. Negative for nausea, vomiting, constipation, abdominal distention, anal bleeding and rectal pain.  Genitourinary: Negative for dysuria, frequency and difficulty urinating.  Musculoskeletal: Negative for myalgias and gait problem.  Skin: Negative for color change, pallor and rash.  Neurological: Negative for dizziness, speech difficulty, weakness and numbness.  Hematological: Negative for adenopathy.  Psychiatric/Behavioral: Negative for confusion and  agitation. The patient is not nervous/anxious.        Objective:   Physical Exam  Constitutional: She is oriented to person, place, and time. She appears well-developed and well-nourished. No distress.  HENT:  Head: Normocephalic.  Mouth/Throat: Oropharynx is clear and moist. No oropharyngeal exudate.  Eyes: Conjunctivae and EOM are normal. Pupils are equal, round, and reactive to light. No scleral icterus.  Neck: Normal range of motion. No tracheal deviation present.  Cardiovascular: Normal rate and intact distal pulses.   Pulmonary/Chest: Effort normal. No respiratory distress. She exhibits no tenderness.  Abdominal: Soft. She exhibits no distension.  There is no tenderness. Hernia confirmed negative in the right inguinal area and confirmed negative in the left inguinal area.       Incisions clean with normal healing ridges.  No hernias  Genitourinary: No vaginal discharge found.  Musculoskeletal: Normal range of motion. She exhibits no tenderness.  Lymphadenopathy:       Right: No inguinal adenopathy present.       Left: No inguinal adenopathy present.  Neurological: She is alert and oriented to person, place, and time. No cranial nerve deficit. She exhibits normal muscle tone. Coordination normal.  Skin: Skin is warm and dry. No rash noted. She is not diaphoretic.  Psychiatric: She has a normal mood and affect. Her behavior is normal.       Assessment:     2 weeks s/p urgent lap chole, gradually recovering    Plan:     Increase activity as tolerated.  Do not push through pain.  Advanced on diet as tolerated. Bowel regimen to avoid problems.  Try low-dose fiber and antidiarrheals as needed.  Should improve over the next few months.  Stick to a low-fat diet until loose bowel movements resolve  Okay to return to work light duty next week 26Aug.  Unrestricted activity the following week after [place]  Return to clinic p.r.n. The patient expressed understanding and appreciation

## 2012-01-04 ENCOUNTER — Encounter (HOSPITAL_COMMUNITY): Payer: Self-pay | Admitting: Emergency Medicine

## 2012-01-04 DIAGNOSIS — R109 Unspecified abdominal pain: Secondary | ICD-10-CM | POA: Insufficient documentation

## 2012-01-04 DIAGNOSIS — Z9889 Other specified postprocedural states: Secondary | ICD-10-CM | POA: Insufficient documentation

## 2012-01-04 DIAGNOSIS — N390 Urinary tract infection, site not specified: Secondary | ICD-10-CM | POA: Insufficient documentation

## 2012-01-04 DIAGNOSIS — Z79899 Other long term (current) drug therapy: Secondary | ICD-10-CM | POA: Insufficient documentation

## 2012-01-04 DIAGNOSIS — N949 Unspecified condition associated with female genital organs and menstrual cycle: Secondary | ICD-10-CM | POA: Insufficient documentation

## 2012-01-04 DIAGNOSIS — Z3202 Encounter for pregnancy test, result negative: Secondary | ICD-10-CM | POA: Insufficient documentation

## 2012-01-04 LAB — URINALYSIS, ROUTINE W REFLEX MICROSCOPIC
Ketones, ur: NEGATIVE mg/dL
Nitrite: NEGATIVE
Protein, ur: NEGATIVE mg/dL
Urobilinogen, UA: 1 mg/dL (ref 0.0–1.0)

## 2012-01-04 NOTE — ED Notes (Signed)
C/o vaginal pain, R flank pain, and increased urination since this morning. Denies nausea and vomiting.

## 2012-01-05 ENCOUNTER — Emergency Department (HOSPITAL_COMMUNITY)
Admission: EM | Admit: 2012-01-05 | Discharge: 2012-01-05 | Disposition: A | Discharge status: Home / Self Care | Payer: Self-pay | Attending: Emergency Medicine | Admitting: Emergency Medicine

## 2012-01-05 DIAGNOSIS — N390 Urinary tract infection, site not specified: Secondary | ICD-10-CM

## 2012-01-05 MED ORDER — KETOROLAC TROMETHAMINE 60 MG/2ML IM SOLN
60.0000 mg | Freq: Once | INTRAMUSCULAR | Status: AC
  Administered 2012-01-05: 60 mg via INTRAMUSCULAR
  Filled 2012-01-05: qty 2

## 2012-01-05 MED ORDER — SULFAMETHOXAZOLE-TRIMETHOPRIM 800-160 MG PO TABS: 1.0000 | ORAL_TABLET | Freq: Two times a day (BID) | ORAL | Status: DC

## 2012-01-05 MED ORDER — SULFAMETHOXAZOLE-TMP DS 800-160 MG PO TABS
1.0000 | ORAL_TABLET | Freq: Once | ORAL | Status: AC
  Administered 2012-01-05: 1 via ORAL
  Filled 2012-01-05: qty 1

## 2012-01-05 MED ORDER — NAPROXEN 500 MG PO TABS: 500.0000 mg | ORAL_TABLET | Freq: Two times a day (BID) | ORAL | Status: DC

## 2012-01-05 NOTE — ED Provider Notes (Signed)
History     CSN: [id number]  Arrival date & time 01/04/12  2224   First MD Initiated Contact with Patient 01/05/12 0128      Chief Complaint  Patient presents with  . Flank Pain  . Vaginal Pain    (Consider location/radiation/quality/duration/timing/severity/associated sxs/prior treatment) HPI Comments: 46 year old female who presents with dysuria, suprapubic discomfort over the last several days which is gradually getting worse, not associated with fevers or nausea. The pain is now in the right flank as well. This seems to get worse with urination, better with rest, she has had no medications prior to arrival. The pain is an achy pain.  Patient is a 46 y.o. female presenting with flank pain and vaginal pain. The history is provided by the patient.  Flank Pain Associated symptoms include abdominal pain.  Vaginal Pain Associated symptoms include abdominal pain.    History reviewed. No pertinent past medical history.  Past Surgical History  Procedure Date  . Breast biopsy 46 years old    cyst removal    Family History  Problem Relation Age of Onset  . Colon cancer Maternal Grandmother   . Brain cancer Maternal Grandmother     History  Substance Use Topics  . Smoking status: Never Smoker   . Smokeless tobacco: Never Used  . Alcohol Use: No    OB History    Grav Para Term Preterm Abortions TAB SAB Ect Mult Living                  Review of Systems  Constitutional: Negative for fever.  Gastrointestinal: Positive for abdominal pain. Negative for nausea and vomiting.  Genitourinary: Positive for dysuria and flank pain. Negative for vaginal pain.  Skin: Negative for rash.    Allergies  Review of patient's allergies indicates no known allergies.  Home Medications   Current Outpatient Rx  Name  Route  Sig  Dispense  Refill  . COD LIVER OIL PO   Oral   Take 1 capsule by mouth daily.         Marland Kitchen VITAMIN B-12 PO   Oral   Take 1 tablet by mouth daily.          Marland Kitchen NAPROXEN SODIUM 220 MG PO TABS   Oral   Take 2 tablets (440 mg total) by mouth 2 (two) times daily as needed. For pain   30 tablet   2   . VITAMIN B-6 PO   Oral   Take 1 tablet by mouth daily.         Marland Kitchen VITAMIN E PO   Oral   Take 1 capsule by mouth daily.           BP 129/63  Pulse 71  Temp 98.3 F (36.8 C) (Oral)  Resp 18  SpO2 99%  LMP 12/23/2011  Physical Exam  Nursing note and vitals reviewed. Constitutional:        Well appearing, no acute distress  HENT:  Head: Normocephalic and atraumatic.  Eyes: Conjunctivae normal are normal. No scleral icterus.  Cardiovascular: Normal rate, regular rhythm and intact distal pulses.   No murmur heard. Pulmonary/Chest: Effort normal and breath sounds normal. No respiratory distress. She has no wheezes. She has no rales.  Abdominal: Soft. There is tenderness (focal tenderness to the suprapubic area, no pain at McBurney's point, no upper abdominal tenderness).       No CVA tender  Musculoskeletal:       No peripheral edema  Skin: Skin  is warm and dry. No rash noted.    ED Course  Procedures (including critical care time)  Labs Reviewed  URINALYSIS, ROUTINE W REFLEX MICROSCOPIC - Abnormal; Notable for the following:    APPearance CLOUDY (*)     Leukocytes, UA SMALL (*)     All other components within normal limits  URINE MICROSCOPIC-ADD ON - Abnormal; Notable for the following:    Squamous Epithelial / LPF FEW (*)     All other components within normal limits  POCT PREGNANCY, URINE  URINE CULTURE   No results found.   1. UTI (urinary tract infection)       MDM  The patient is well-appearing, she has signs and symptoms consistent with a urinary tract infection, on results of her labs she has too numerous to count white blood cells with bacteria present. Due to the pain in her flank will obtain a urine culture, antibiotics given prior to discharge, the patient is stable appearing with normal vital including  a temperature of 98.3 and a pulse of 71 with a normal blood pressure.  Patient given instructions for return should symptoms worsen, understanding expressed.        Vida Roller, MD 01/05/12 0200

## 2012-01-07 LAB — URINE CULTURE: Colony Count: 45000

## 2012-01-09 NOTE — ED Notes (Signed)
+   urine culture. Treated with Septra, sensitive to same per protocol MD. 

## 2012-06-14 ENCOUNTER — Emergency Department (HOSPITAL_COMMUNITY)
Admission: EM | Admit: 2012-06-14 | Discharge: 2012-06-15 | Disposition: A | Discharge status: Home / Self Care | Payer: Self-pay | Attending: Emergency Medicine | Admitting: Emergency Medicine

## 2012-06-14 ENCOUNTER — Encounter (HOSPITAL_COMMUNITY): Payer: Self-pay | Admitting: *Deleted

## 2012-06-14 DIAGNOSIS — Z79899 Other long term (current) drug therapy: Secondary | ICD-10-CM | POA: Insufficient documentation

## 2012-06-14 DIAGNOSIS — Z3202 Encounter for pregnancy test, result negative: Secondary | ICD-10-CM | POA: Insufficient documentation

## 2012-06-14 DIAGNOSIS — Z202 Contact with and (suspected) exposure to infections with a predominantly sexual mode of transmission: Secondary | ICD-10-CM | POA: Insufficient documentation

## 2012-06-14 LAB — WET PREP, GENITAL
Clue Cells Wet Prep HPF POC: NONE SEEN
Trich, Wet Prep: NONE SEEN
WBC, Wet Prep HPF POC: NONE SEEN
Yeast Wet Prep HPF POC: NONE SEEN

## 2012-06-14 MED ORDER — AZITHROMYCIN 250 MG PO TABS
1000.0000 mg | ORAL_TABLET | Freq: Once | ORAL | Status: AC
  Administered 2012-06-14: 1000 mg via ORAL
  Filled 2012-06-14: qty 1

## 2012-06-14 MED ORDER — CEFTRIAXONE SODIUM 1 G IJ SOLR
1.0000 g | Freq: Once | INTRAMUSCULAR | Status: AC
  Administered 2012-06-14: 1 g via INTRAMUSCULAR
  Filled 2012-06-14: qty 10

## 2012-06-14 NOTE — ED Provider Notes (Signed)
History     CSN: [id number]  Arrival date & time 06/14/12  2128   First MD Initiated Contact with Patient 06/14/12 2302      Chief Complaint  Patient presents with  . Exposure to STD    (Consider location/radiation/quality/duration/timing/severity/associated sxs/prior treatment) HPI History per patient: here requesting STD check, was told today by her sexual partner that she may have been exposed to STD.  She unsure which one.  She has remote h/o STD but again uncertain of which one.  She denies any rash, lesion, vag discharge or abnormal vag bleeding, no F/C or N/V and no joint pains. Current menses, LMP on time and normal. No pelvic pain ro ABD pain.  No OB GYN, last pelvic exam about a year ago - PCP does any pelvic exams History reviewed. No pertinent past medical history.  Past Surgical History  Procedure Laterality Date  . Breast biopsy  47 years old    cyst removal    Family History  Problem Relation Age of Onset  . Colon cancer Maternal Grandmother   . Brain cancer Maternal Grandmother     History  Substance Use Topics  . Smoking status: Never Smoker   . Smokeless tobacco: Never Used  . Alcohol Use: No    OB History   Grav Para Term Preterm Abortions TAB SAB Ect Mult Living                  Review of Systems  Constitutional: Negative for fever and chills.  HENT: Negative for neck pain and neck stiffness.   Eyes: Negative for pain.  Respiratory: Negative for shortness of breath.   Cardiovascular: Negative for chest pain.  Gastrointestinal: Negative for abdominal pain.  Genitourinary: Negative for dysuria, frequency, flank pain, vaginal discharge, difficulty urinating, genital sores, vaginal pain and pelvic pain.  Musculoskeletal: Negative for back pain.  Skin: Negative for rash.  Neurological: Negative for headaches.  All other systems reviewed and are negative.    Allergies  Review of patient's allergies indicates no known allergies.  Home  Medications   Current Outpatient Rx  Name  Route  Sig  Dispense  Refill  . COD LIVER OIL PO   Oral   Take 1 capsule by mouth daily.         . Cyanocobalamin (VITAMIN B-12 PO)   Oral   Take 1 tablet by mouth daily.         . naproxen (NAPROSYN) 500 MG tablet   Oral   Take 1 tablet (500 mg total) by mouth 2 (two) times daily with a meal.   30 tablet   0   . naproxen sodium (ANAPROX) 220 MG tablet   Oral   Take 2 tablets (440 mg total) by mouth 2 (two) times daily as needed. For pain   30 tablet   2   . Pyridoxine HCl (VITAMIN B-6 PO)   Oral   Take 1 tablet by mouth daily.         Marland Kitchen sulfamethoxazole-trimethoprim (SEPTRA DS) 800-160 MG per tablet   Oral   Take 1 tablet by mouth every 12 (twelve) hours.   14 tablet   0   . VITAMIN E PO   Oral   Take 1 capsule by mouth daily.           BP 140/84  Pulse 69  Temp(Src) 98.4 F (36.9 C) (Oral)  Resp 20  Ht 5\' 7"  (1.702 m)  Wt 225 lb (102.059 kg)  BMI 35.23 kg/m2  SpO2 98%  LMP 06/13/2012  Physical Exam  Constitutional: She is oriented to person, place, and time. She appears well-developed and well-nourished.  HENT:  Head: Normocephalic and atraumatic.  Mouth/Throat: Oropharynx is clear and moist.  Eyes: EOM are normal. Pupils are equal, round, and reactive to light.  Neck: Neck supple.  Cardiovascular: Normal rate, regular rhythm and intact distal pulses.   Pulmonary/Chest: Effort normal and breath sounds normal. No respiratory distress.  Abdominal: Soft. Bowel sounds are normal. She exhibits no distension. There is no tenderness.  Genitourinary:  Normal ext GU, no CMT, dark blood in vault, no adnexal tenderness  Musculoskeletal: Normal range of motion. She exhibits no edema.  Neurological: She is alert and oriented to person, place, and time.  Skin: Skin is warm and dry.    ED Course  Procedures (including critical care time)  Results for orders placed during the hospital encounter of 06/14/12   WET PREP, GENITAL      Result Value Range   Yeast Wet Prep HPF POC NONE SEEN  NONE SEEN   Trich, Wet Prep NONE SEEN  NONE SEEN   Clue Cells Wet Prep HPF POC NONE SEEN  NONE SEEN   WBC, Wet Prep HPF POC NONE SEEN  NONE SEEN  POCT PREGNANCY, URINE      Result Value Range   Preg Test, Ur NEGATIVE  NEGATIVE   IM rocephin and azithro provided UA ordered and downtime results not available, U preg negative  STD precautions provided, plan f/u Health Dept with referral provided MDM  STD exposure  GC/ Chl/ RPR pending VS and nursing notes reviewed and considered       Sunnie Nielsen, MD 06/15/12 [phone]

## 2012-06-14 NOTE — ED Notes (Signed)
Pt states that her significant other advised her that she needed to be checked for STD; he did not advised as to which STD

## 2012-06-15 LAB — URINALYSIS, ROUTINE W REFLEX MICROSCOPIC
Glucose, UA: NEGATIVE mg/dL
Ketones, ur: NEGATIVE mg/dL
Leukocytes, UA: NEGATIVE
Nitrite: NEGATIVE
Protein, ur: NEGATIVE mg/dL
Urobilinogen, UA: 1 mg/dL (ref 0.0–1.0)

## 2012-06-15 NOTE — ED Notes (Signed)
Pt rec'd paper copy of discharge instruction. Will rec. Test results on Tuesday from health dept. Patient is aware per verbal exchange and discharge paper work. Patient discharge home.

## 2012-07-25 ENCOUNTER — Emergency Department (HOSPITAL_COMMUNITY)
Admission: EM | Admit: 2012-07-25 | Discharge: 2012-07-25 | Disposition: A | Discharge status: Home / Self Care | Payer: Self-pay | Attending: Emergency Medicine | Admitting: Emergency Medicine

## 2012-07-25 ENCOUNTER — Encounter (HOSPITAL_COMMUNITY): Payer: Self-pay | Admitting: *Deleted

## 2012-07-25 DIAGNOSIS — H1033 Unspecified acute conjunctivitis, bilateral: Secondary | ICD-10-CM

## 2012-07-25 DIAGNOSIS — H579 Unspecified disorder of eye and adnexa: Secondary | ICD-10-CM | POA: Insufficient documentation

## 2012-07-25 DIAGNOSIS — H109 Unspecified conjunctivitis: Secondary | ICD-10-CM | POA: Insufficient documentation

## 2012-07-25 DIAGNOSIS — H5789 Other specified disorders of eye and adnexa: Secondary | ICD-10-CM | POA: Insufficient documentation

## 2012-07-25 MED ORDER — TETRACAINE HCL 0.5 % OP SOLN
1.0000 [drp] | Freq: Once | OPHTHALMIC | Status: AC
  Administered 2012-07-25: 1 [drp] via OPHTHALMIC
  Filled 2012-07-25: qty 2

## 2012-07-25 MED ORDER — ERYTHROMYCIN 5 MG/GM OP OINT: TOPICAL_OINTMENT | Freq: Four times a day (QID) | OPHTHALMIC | Status: DC

## 2012-07-25 MED ORDER — FLUORESCEIN SODIUM 1 MG OP STRP
1.0000 | ORAL_STRIP | Freq: Once | OPHTHALMIC | Status: AC
  Administered 2012-07-25: 1 via OPHTHALMIC
  Filled 2012-07-25: qty 1

## 2012-07-25 NOTE — ED Notes (Signed)
C/O redness and burning right eye. Left eye starting to feel irritated today.

## 2012-07-25 NOTE — ED Notes (Signed)
The pt was at work last pm and felt something pop in both her eyes.  She reports that it is painful and itches.

## 2012-07-25 NOTE — ED Provider Notes (Signed)
History  This chart was scribed for Glade Nurse, PA-C, working with Toy Baker, MD by Ardelia Mems, ED Scribe. This patient was seen in room TR04C/TR04C and the patient's care was started at 6:04 PM.  CSN: [id number]  Arrival date & time 07/25/12  1757    Chief Complaint  Patient presents with  . Eye Pain    The history is provided by the patient. No language interpreter was used.   HPI Comments: Tracy Meyer is a 47 y.o. female who presents to the Emergency Department complaining of constant, dull, aching bilateral eye pain with associated itching.  Pt states that she has had mucoid discharge in her eyes for 2-3 days, which she has been wiping her eyes free of the mucous Pt works 7p - 7a. She went home to sleep and awoke later today with her eyes "glued with crusties." She rubbed teh mucoid debris from her eyes and went back to work. Pt states she did not feel a "pop" in her eyes,but did think she might have gotten something in them as the itching and dull ache worsened, though she cannot recollect how or what might have gotten in them.  Admits tearing, watering and burning. Pt denies eye injury. Pt does not wear contact lenses. She denies sore throat, nausea or any other symptoms. Pt denies alcohol use and denies smoking.   PCP- Jearld Lesch, MD Ophthalmologist- Yes   History reviewed. No pertinent past medical history.  Past Surgical History  Procedure Laterality Date  . Breast biopsy  47 years old    cyst removal   Family History  Problem Relation Age of Onset  . Colon cancer Maternal Grandmother   . Brain cancer Maternal Grandmother    History  Substance Use Topics  . Smoking status: Never Smoker   . Smokeless tobacco: Never Used  . Alcohol Use: No   OB History   Grav Para Term Preterm Abortions TAB SAB Ect Mult Living                 Review of Systems  Constitutional: Negative for fever and chills.  HENT: Negative for congestion, sore throat,  rhinorrhea and neck pain.   Eyes: Positive for pain, discharge, redness and itching. Negative for photophobia and visual disturbance.  Respiratory: Negative for cough and shortness of breath.   Cardiovascular: Negative for chest pain and leg swelling.  Gastrointestinal: Negative for nausea, vomiting, abdominal pain and diarrhea.  Genitourinary: Negative for dysuria.  Musculoskeletal: Negative for back pain.  Skin: Negative for rash.  Neurological: Negative for headaches.  Psychiatric/Behavioral: Negative for confusion.  A complete 10 system review of systems was obtained and all systems are negative except as noted in the HPI and PMH.   Allergies  Shellfish allergy  Home Medications  No current outpatient prescriptions on file.  Triage Vitals: BP 116/76  Pulse 65  Temp(Src) 98.9 F (37.2 C)  Resp 18  SpO2 98%  Physical Exam  Nursing note and vitals reviewed. Constitutional: She is oriented to person, place, and time. She appears well-developed and well-nourished. No distress.  HENT:  Head: Normocephalic and atraumatic.  Right Ear: External ear normal.  Left Ear: External ear normal.  Nose: Nose normal.  Mouth/Throat: Mucous membranes are normal.  Eyes: EOM are normal. Pupils are equal, round, and reactive to light. Right eye exhibits discharge. Right eye exhibits no exudate and no hordeolum. No foreign body present in the right eye. Left eye exhibits discharge. Left eye exhibits no  exudate and no hordeolum. Right conjunctiva is injected. Right conjunctiva has no hemorrhage. Left conjunctiva is injected. Left conjunctiva has no hemorrhage. No scleral icterus.    No corneal abrasion. Both conjunctiva were injected. Sclera were injected.   Neck: Normal range of motion. Neck supple.  No meningeal signs  Cardiovascular: Normal rate, regular rhythm and normal heart sounds.  Exam reveals no gallop and no friction rub.   No murmur heard. Pulmonary/Chest: Effort normal and breath  sounds normal. No respiratory distress. She has no wheezes. She has no rales. She exhibits no tenderness.  Abdominal: Soft. Bowel sounds are normal. She exhibits no distension. There is no tenderness. There is no rebound and no guarding.  Musculoskeletal: Normal range of motion. She exhibits no edema and no tenderness.  Neurological: She is alert and oriented to person, place, and time. No cranial nerve deficit.  Skin: Skin is warm and dry. She is not diaphoretic. No erythema.    ED Course  Procedures (including critical care time)  DIAGNOSTIC STUDIES: Oxygen Saturation is 98% on RA, normal by my interpretation.    COORDINATION OF CARE: 6:38 PM- Pt advised of plan for treatment of pink eye and pt agrees.  Medications  fluorescein ophthalmic strip 1 strip (1 strip Both Eyes Given 07/25/12 1840)  tetracaine (PONTOCAINE) 0.5 % ophthalmic solution 1 drop (1 drop Both Eyes Given 07/25/12 1840)      Labs Reviewed - No data to display  No results found.  1. Acute bacterial conjunctivitis of both eyes     MDM  On PE Bilateral Distance: 20/50 ; R Distance: 20/50 ; L Distance: 20/50. Pt does not wear contact lenses. Patient presentation consistent with bacterial conjunctivitis. No corneal abrasions, entrapment, consensual photophobia, or dendritic staining with fluorescein study.  Presentation non-concerning for iritis, viral conjunctivitis, corneal abrasions, or HSV.  Personal hygiene and frequent handwashing discussed.  Patient advised to followup with ophthalmologist if symptoms persist or worsen in any way including vision change, increased redness/swelling, fever.  Patient verbalizes understanding and is agreeable with discharge.  I personally performed the services described in this documentation, which was scribed in my presence. The recorded information has been reviewed and is accurate.    Glade Nurse, PA-C 07/25/12 2147

## 2012-07-27 NOTE — ED Provider Notes (Signed)
Medical screening examination/treatment/procedure(s) were performed by non-physician practitioner and as supervising physician I was immediately available for consultation/collaboration.  Almina Schul T Murice Barbar, MD 07/27/12 1943 

## 2012-07-28 ENCOUNTER — Encounter (HOSPITAL_COMMUNITY): Payer: Self-pay | Admitting: Adult Health

## 2012-07-28 ENCOUNTER — Emergency Department (HOSPITAL_COMMUNITY)
Admission: EM | Admit: 2012-07-28 | Discharge: 2012-07-28 | Disposition: A | Discharge status: Home / Self Care | Payer: Self-pay | Attending: Emergency Medicine | Admitting: Emergency Medicine

## 2012-07-28 DIAGNOSIS — Y999 Unspecified external cause status: Secondary | ICD-10-CM | POA: Insufficient documentation

## 2012-07-28 DIAGNOSIS — Y929 Unspecified place or not applicable: Secondary | ICD-10-CM | POA: Insufficient documentation

## 2012-07-28 DIAGNOSIS — H538 Other visual disturbances: Secondary | ICD-10-CM | POA: Insufficient documentation

## 2012-07-28 DIAGNOSIS — T1590XA Foreign body on external eye, part unspecified, unspecified eye, initial encounter: Secondary | ICD-10-CM | POA: Insufficient documentation

## 2012-07-28 DIAGNOSIS — S058X9A Other injuries of unspecified eye and orbit, initial encounter: Secondary | ICD-10-CM | POA: Insufficient documentation

## 2012-07-28 DIAGNOSIS — H109 Unspecified conjunctivitis: Secondary | ICD-10-CM | POA: Insufficient documentation

## 2012-07-28 MED ORDER — NEOMYCIN-POLYMYXIN-HC 3.5-10000-1 OT SUSP: 4.0000 [drp] | Freq: Four times a day (QID) | OTIC | Status: DC

## 2012-07-28 MED ORDER — NAPHAZOLINE-PHENIRAMINE 0.025-0.3 % OP SOLN: 1.0000 [drp] | Freq: Three times a day (TID) | OPHTHALMIC | Status: DC

## 2012-07-28 MED ORDER — CIPROFLOXACIN HCL 0.3 % OP SOLN: 1.0000 [drp] | OPHTHALMIC | Status: DC

## 2012-07-28 MED ORDER — TETRACAINE HCL 0.5 % OP SOLN
2.0000 [drp] | Freq: Once | OPHTHALMIC | Status: DC
  Filled 2012-07-28: qty 2

## 2012-07-28 MED ORDER — FLUORESCEIN SODIUM 1 MG OP STRP
1.0000 | ORAL_STRIP | Freq: Once | OPHTHALMIC | Status: DC
  Filled 2012-07-28: qty 1

## 2012-07-28 NOTE — ED Notes (Signed)
Pt denies any questions upon discharge. Verbalize understanding of rx usage.

## 2012-07-28 NOTE — ED Provider Notes (Signed)
History    This chart was scribed for non-physician practitioner Dierdre Forth, PA working with Gerhard Munch, MD by Quintella Reichert, ED Scribe. This patient was seen in room TR04C/TR04C and the patient's care was started at 8:48 PM.   CSN: [id number]  Arrival date & time 07/28/12  1642    Chief Complaint  Patient presents with  . Conjunctivitis    The history is provided by the patient. No language interpreter was used.     HPI Comments: Tracy Meyer is a 47 y.o. female who presents to the Emergency Department complaining of 6 days of constant, progressively-worsening redness, drainage and itchiness to both eyes.  She also complains of crusting and swelling of both eyelids, as well as blurred vision due to discharge.  She denies pain or injury to the eye or any foreign bodies to her knowledge.  Pt received erythromycin ointment 3 days ago, which she has been using 4x/day, without relief.  She has also attempted to treat symptoms with a warm compress, without relief.  She denies fever, cough, nausea, emesis, or any other associated symptoms.  Patient does not wear contact lenses. She has not attempted to followup with ophthalmology.  Review shows that patient was seen on 07/25/2012, diagnosed with bacterial conjunctivitis and given erythromycin ointment.   History reviewed. No pertinent past medical history.   Past Surgical History  Procedure Laterality Date  . Breast biopsy  47 years old    cyst removal    Family History  Problem Relation Age of Onset  . Colon cancer Maternal Grandmother   . Brain cancer Maternal Grandmother     History  Substance Use Topics  . Smoking status: Never Smoker   . Smokeless tobacco: Never Used  . Alcohol Use: No    OB History   Grav Para Term Preterm Abortions TAB SAB Ect Mult Living                   Review of Systems  Constitutional: Negative for fever, diaphoresis, appetite change, fatigue and unexpected weight change.   HENT: Negative for mouth sores and neck stiffness.   Eyes: Positive for discharge, redness and itching. Negative for photophobia, pain and visual disturbance.  Respiratory: Negative for cough, chest tightness, shortness of breath and wheezing.   Cardiovascular: Negative for chest pain.  Gastrointestinal: Negative for nausea, vomiting, abdominal pain, diarrhea and constipation.  Endocrine: Negative for polydipsia, polyphagia and polyuria.  Genitourinary: Negative for dysuria, urgency, frequency and hematuria.  Musculoskeletal: Negative for back pain.  Skin: Negative for rash.  Allergic/Immunologic: Negative for immunocompromised state.  Neurological: Negative for syncope, light-headedness and headaches.  Hematological: Does not bruise/bleed easily.  Psychiatric/Behavioral: Negative for sleep disturbance. The patient is not nervous/anxious.       Allergies  Shellfish allergy  Home Medications   Current Outpatient Rx  Name  Route  Sig  Dispense  Refill  . erythromycin ophthalmic ointment   Both Eyes   Place 1 application into both eyes every 6 (six) hours. For 10 days; Start date 07/25/12         . ciprofloxacin (CILOXAN) 0.3 % ophthalmic solution   Both Eyes   Place 1 drop into both eyes every 2 (two) hours. Administer 1 drop, every 2 hours, while awake, for 2 days. Then 1 drop, every 4 hours, while awake, for the next 5 days.   5 mL   0   . naphazoline-pheniramine (NAPHCON-A) 0.025-0.3 % ophthalmic solution   Ophthalmic  Apply 1 drop to eye 3 (three) times daily.   5 mL   0    BP 137/68  Pulse 73  Temp(Src) 98.3 F (36.8 C) (Oral)  Resp 18  SpO2 95%  Physical Exam  Nursing note and vitals reviewed. Constitutional: She is oriented to person, place, and time. She appears well-developed and well-nourished. No distress.  HENT:  Head: Normocephalic and atraumatic.  Right Ear: Tympanic membrane, external ear and ear canal normal.  Left Ear: Tympanic membrane,  external ear and ear canal normal.  Nose: Nose normal. No mucosal edema or rhinorrhea.  Mouth/Throat: Uvula is midline, oropharynx is clear and moist and mucous membranes are normal. Mucous membranes are not dry. No edematous. No oropharyngeal exudate, posterior oropharyngeal edema, posterior oropharyngeal erythema or tonsillar abscesses.  Eyes: EOM are normal. Pupils are equal, round, and reactive to light. No foreign bodies found. Right eye exhibits discharge. Right eye exhibits no chemosis and no exudate. No foreign body present in the right eye. Left eye exhibits no chemosis, no discharge and no exudate. No foreign body present in the left eye. Right conjunctiva is injected. Right conjunctiva has no hemorrhage. Left conjunctiva is injected. Left conjunctiva has no hemorrhage. No scleral icterus. Right eye exhibits normal extraocular motion. Left eye exhibits normal extraocular motion.  Fundoscopic exam:      The right eye shows no arteriolar narrowing, no AV nicking, no exudate, no hemorrhage and no papilledema.       The left eye shows no arteriolar narrowing, no AV nicking, no exudate, no hemorrhage and no papilledema.  Slit lamp exam:      The right eye shows corneal abrasion and fluorescein uptake. The right eye shows no corneal flare, no corneal ulcer, no foreign body, no hyphema and no anterior chamber bulge.       The left eye shows corneal abrasion and fluorescein uptake. The left eye shows no corneal flare, no corneal ulcer, no foreign body, no hyphema and no anterior chamber bulge.    Visual Acuity: Bilateral Near:  20/50 ;  R Near:  20/70 ;  L Near:  20/50  Neck: Normal range of motion. Neck supple.  Cardiovascular: Normal rate, regular rhythm, normal heart sounds and intact distal pulses.   No murmur heard. Pulmonary/Chest: Effort normal and breath sounds normal. No respiratory distress. She has no wheezes.  Musculoskeletal: Normal range of motion. She exhibits no edema.   Neurological: She is alert and oriented to person, place, and time. She exhibits normal muscle tone. Coordination normal.  Speech is clear and goal oriented Moves extremities without ataxia  Skin: Skin is warm and dry. No rash noted. She is not diaphoretic. No erythema.  Psychiatric: She has a normal mood and affect.    ED Course  Procedures (including critical care time)   DIAGNOSTIC STUDIES: Oxygen Saturation is 95% on room air, adequate by my interpretation.    COORDINATION OF CARE: 8:57 PM-Discussed treatment plan which includes antibiotic eye drops and f/u with ophthalmologist with pt at bedside and pt agreed to plan.    SLIT LAMP EXAM: Tetracaine 2 drops used.  Lids everted and swept for exam, no evidence of foreign body.  Conjunctivae: Injection, no hematoma  Cornea: Central abrasion on both eyes EOM: Intact  Pupils: PERRL  Fluorescein uptake: Yes No dendritic lesions.  Patient tolerated procedure well without immediate complications.    Labs Reviewed - No data to display  No results found.  1. Conjunctivitis  MDM  Tracy Meyer presents with symptoms consistent with bacterial conjunctivitis.  Patient presentation consistent with conjunctivitis.  Purulent discharge on exam.  No entrapment, consensual photophobia, or dendritic staining with fluorescein study.  Pt with bilateral corneal abrasions likely secondary to scratching. Presentation non-concerning for iritis, or HSV.  Patient will be given ciprofloxacin ophthalmic.  Patient does not wear contact lenses. Personal hygiene and frequent handwashing discussed.  Patient advised to followup with ophthalmologist for reevaluation in several days..  Patient verbalizes understanding and is agreeable with discharge.  I have also discussed reasons to return immediately to the ER.  Patient expresses understanding and agrees with plan.  I personally performed the services described in this documentation, which was scribed  in my presence. The recorded information has been reviewed and is accurate.       Dahlia Client Kristeena Meineke, PA-C 07/28/12 2149

## 2012-07-28 NOTE — ED Notes (Signed)
C/o redness, drainage, itchiness to bilateral eyes. Pt received erythromycin ointment and has been using it but eye is not getting better.

## 2012-07-29 NOTE — ED Provider Notes (Signed)
  Medical screening examination/treatment/procedure(s) were performed by non-physician practitioner and as supervising physician I was immediately available for consultation/collaboration.    Phoebe Marter, MD 07/29/12 0044 

## 2012-09-02 ENCOUNTER — Encounter (HOSPITAL_COMMUNITY): Payer: Self-pay | Admitting: *Deleted

## 2012-09-02 ENCOUNTER — Emergency Department (HOSPITAL_COMMUNITY)
Admission: EM | Admit: 2012-09-02 | Discharge: 2012-09-02 | Disposition: A | Discharge status: Home / Self Care | Payer: Self-pay | Attending: Emergency Medicine | Admitting: Emergency Medicine

## 2012-09-02 DIAGNOSIS — Y929 Unspecified place or not applicable: Secondary | ICD-10-CM | POA: Insufficient documentation

## 2012-09-02 DIAGNOSIS — L299 Pruritus, unspecified: Secondary | ICD-10-CM | POA: Insufficient documentation

## 2012-09-02 DIAGNOSIS — G8929 Other chronic pain: Secondary | ICD-10-CM | POA: Insufficient documentation

## 2012-09-02 DIAGNOSIS — W57XXXA Bitten or stung by nonvenomous insect and other nonvenomous arthropods, initial encounter: Secondary | ICD-10-CM

## 2012-09-02 DIAGNOSIS — Y939 Activity, unspecified: Secondary | ICD-10-CM | POA: Insufficient documentation

## 2012-09-02 DIAGNOSIS — Z79899 Other long term (current) drug therapy: Secondary | ICD-10-CM | POA: Insufficient documentation

## 2012-09-02 DIAGNOSIS — M549 Dorsalgia, unspecified: Secondary | ICD-10-CM

## 2012-09-02 DIAGNOSIS — R21 Rash and other nonspecific skin eruption: Secondary | ICD-10-CM | POA: Insufficient documentation

## 2012-09-02 DIAGNOSIS — M545 Low back pain, unspecified: Secondary | ICD-10-CM | POA: Insufficient documentation

## 2012-09-02 DIAGNOSIS — Z792 Long term (current) use of antibiotics: Secondary | ICD-10-CM | POA: Insufficient documentation

## 2012-09-02 MED ORDER — TRAMADOL HCL 50 MG PO TABS: 50.0000 mg | ORAL_TABLET | Freq: Four times a day (QID) | ORAL | Status: DC | PRN

## 2012-09-02 MED ORDER — DEXAMETHASONE SODIUM PHOSPHATE 10 MG/ML IJ SOLN
10.0000 mg | Freq: Once | INTRAMUSCULAR | Status: AC
  Administered 2012-09-02: 10 mg via INTRAMUSCULAR
  Filled 2012-09-02: qty 1

## 2012-09-02 MED ORDER — PERMETHRIN 5 % EX CREA: TOPICAL_CREAM | CUTANEOUS | Status: DC

## 2012-09-02 NOTE — ED Provider Notes (Signed)
CSN: [id number]     Arrival date & time 09/02/12  4098 History     First MD Initiated Contact with Patient 09/02/12 1014     Chief Complaint  Patient presents with  . Rash   (Consider location/radiation/quality/duration/timing/severity/associated sxs/prior Treatment) Patient is a 47 y.o. female presenting with rash. The history is provided by the patient and medical records.  Rash  Patient presents to the ED for diffuse bodily rash. She states new lesions appeared daily and in random placed.  Notes lesions itch, burn, and are sometimes painful.  She has tried not to scratch the areas. No recent tick exposure. No changes in soap detergents. No fevers, sweats, or chills. No one at home with similar rash.  Has not tried any topical medications or Benadryl. Patient states she does have a dog, is unsure of her dog has fleas or brought any bugs into the house.  Patient also complains of chronic, low-back pain due to herniated disc in her LS.  States pain continues to radiate down right leg (unchanged from previous) but states it does seem to get worse during rainy weather.  Patient states she does not taking large amounts of pain medication, because they tend to make her very drowsy. She is not currently enrolled in pain management. Denies any numbness or paresthesias of lower extremities. No loss of bowel or bladder function.  No past medical history on file. Past Surgical History  Procedure Laterality Date  . Breast biopsy  47 years old    cyst removal   Family History  Problem Relation Age of Onset  . Colon cancer Maternal Grandmother   . Brain cancer Maternal Grandmother    History  Substance Use Topics  . Smoking status: Never Smoker   . Smokeless tobacco: Never Used  . Alcohol Use: No   OB History   Grav Para Term Preterm Abortions TAB SAB Ect Mult Living                 Review of Systems  Skin: Positive for rash.  All other systems reviewed and are  negative.    Allergies  Shellfish allergy  Home Medications   Current Outpatient Rx  Name  Route  Sig  Dispense  Refill  . ciprofloxacin (CILOXAN) 0.3 % ophthalmic solution   Both Eyes   Place 1 drop into both eyes every 2 (two) hours. Administer 1 drop, every 2 hours, while awake, for 2 days. Then 1 drop, every 4 hours, while awake, for the next 5 days.   5 mL   0   . naphazoline-pheniramine (NAPHCON-A) 0.025-0.3 % ophthalmic solution   Ophthalmic   Apply 1 drop to eye 3 (three) times daily.   5 mL   0    BP 143/73  Pulse 80  Temp(Src) 98.3 F (36.8 C) (Oral)  Resp 18  SpO2 100%  Physical Exam  Nursing note and vitals reviewed. Constitutional: She is oriented to person, place, and time. She appears well-developed and well-nourished.  HENT:  Head: Normocephalic and atraumatic.  Eyes: Conjunctivae and EOM are normal.  Neck: Normal range of motion. Neck supple.  Cardiovascular: Normal rate, regular rhythm and normal heart sounds.   Pulmonary/Chest: Effort normal and breath sounds normal.  Musculoskeletal: Normal range of motion. She exhibits no edema.       Lumbar back: She exhibits tenderness and pain. She exhibits normal range of motion, no bony tenderness, no swelling, no edema, no deformity, no laceration and no spasm.  Back:  TTP right lumbar paraspinal muscles without spasm; full ROM maintained, distal sensation intact, normal gait   Neurological: She is alert and oriented to person, place, and time.  Skin: Skin is warm and dry.  Diffuse, pruritic, small, non-weeping, bug bite appearing lesions with excoriation; no surrounding erythema, swelling, induration, or signs of cellulitis  Psychiatric: She has a normal mood and affect.    ED Course   Procedures (including critical care time)  Labs Reviewed - No data to display No results found.  1. Insect bites   2. Back pain     MDM   Atraumatic, chronic low back pain.  Pt ambulating around ED without  difficulty, no concern for cauda equina.  Excoriated, bug bite appearing lesions without evidence of localized infection or cellulitis. Decadron given in the ED.  Instructed to take OTC benadryl for itching.  Rx permethrin and tramadol.  Discussed plan with pt, she agreed.  Return precautions advised.  Garlon Hatchet, PA-C 09/02/12 1322

## 2012-09-02 NOTE — ED Notes (Signed)
Pt is here with rash all over that continues to pop up and states it burns, itches, and is painful

## 2012-09-03 NOTE — ED Provider Notes (Signed)
Medical screening examination/treatment/procedure(s) were performed by non-physician practitioner and as supervising physician I was immediately available for consultation/collaboration.   Toshie Demelo M Deloss Amico, DO 09/03/12 2218 

## 2012-11-27 ENCOUNTER — Emergency Department (HOSPITAL_COMMUNITY): Payer: BC Managed Care – PPO

## 2012-11-27 ENCOUNTER — Encounter (HOSPITAL_COMMUNITY): Payer: Self-pay | Admitting: Emergency Medicine

## 2012-11-27 ENCOUNTER — Emergency Department (HOSPITAL_COMMUNITY)
Admission: EM | Admit: 2012-11-27 | Discharge: 2012-11-27 | Disposition: A | Discharge status: Home / Self Care | Payer: BC Managed Care – PPO | Attending: Emergency Medicine | Admitting: Emergency Medicine

## 2012-11-27 DIAGNOSIS — M549 Dorsalgia, unspecified: Secondary | ICD-10-CM

## 2012-11-27 DIAGNOSIS — IMO0002 Reserved for concepts with insufficient information to code with codable children: Secondary | ICD-10-CM | POA: Insufficient documentation

## 2012-11-27 DIAGNOSIS — G8911 Acute pain due to trauma: Secondary | ICD-10-CM | POA: Insufficient documentation

## 2012-11-27 DIAGNOSIS — M5431 Sciatica, right side: Secondary | ICD-10-CM

## 2012-11-27 DIAGNOSIS — M545 Low back pain, unspecified: Secondary | ICD-10-CM | POA: Insufficient documentation

## 2012-11-27 DIAGNOSIS — R209 Unspecified disturbances of skin sensation: Secondary | ICD-10-CM | POA: Insufficient documentation

## 2012-11-27 DIAGNOSIS — M2550 Pain in unspecified joint: Secondary | ICD-10-CM | POA: Insufficient documentation

## 2012-11-27 DIAGNOSIS — M5430 Sciatica, unspecified side: Secondary | ICD-10-CM | POA: Insufficient documentation

## 2012-11-27 DIAGNOSIS — R5381 Other malaise: Secondary | ICD-10-CM | POA: Insufficient documentation

## 2012-11-27 DIAGNOSIS — M5416 Radiculopathy, lumbar region: Secondary | ICD-10-CM

## 2012-11-27 DIAGNOSIS — M79609 Pain in unspecified limb: Secondary | ICD-10-CM

## 2012-11-27 DIAGNOSIS — L608 Other nail disorders: Secondary | ICD-10-CM | POA: Insufficient documentation

## 2012-11-27 MED ORDER — IBUPROFEN 800 MG PO TABS: 800.0000 mg | ORAL_TABLET | Freq: Three times a day (TID) | ORAL | Status: DC

## 2012-11-27 MED ORDER — PREDNISONE 20 MG PO TABS: ORAL_TABLET | ORAL | Status: DC

## 2012-11-27 NOTE — ED Notes (Signed)
The pt advised of the wait to get her doppler study performed.  Blanket given

## 2012-11-27 NOTE — ED Notes (Signed)
Pt waiting for a disposttion

## 2012-11-27 NOTE — ED Notes (Signed)
The pt just returned from mri 
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
## 2012-11-27 NOTE — Progress Notes (Signed)
VASCULAR LAB PRELIMINARY  PRELIMINARY  PRELIMINARY  PRELIMINARY  Right lower extremity venous duplex completed.    Preliminary report:  Right:  No evidence of DVT, superficial thrombosis, or Baker's cyst.  Noor Witte, RVT 11/27/2012, 6:46 PM

## 2012-11-27 NOTE — ED Notes (Signed)
The pt just went to [facility]

## 2012-11-27 NOTE — ED Notes (Signed)
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

## 2012-11-27 NOTE — ED Notes (Signed)
The pt is c/o pain in her lower back for 2 years and she is also c/o pain in her lt thumb and she also has knots on her rt foot and ankle

## 2012-11-27 NOTE — ED Notes (Signed)
Doppler study complete waiting for mri

## 2012-11-28 NOTE — ED Provider Notes (Signed)
Medical screening examination/treatment/procedure(s) were performed by non-physician practitioner and as supervising physician I was immediately available for consultation/collaboration.  EKG Interpretation   None        Courtney F Horton, MD 11/28/12 1444 

## 2013-01-05 ENCOUNTER — Encounter (HOSPITAL_COMMUNITY): Payer: Self-pay | Admitting: Emergency Medicine

## 2013-01-05 ENCOUNTER — Emergency Department (HOSPITAL_COMMUNITY)
Admission: EM | Admit: 2013-01-05 | Discharge: 2013-01-05 | Disposition: A | Discharge status: Home / Self Care | Payer: BC Managed Care – PPO | Attending: Emergency Medicine | Admitting: Emergency Medicine

## 2013-01-05 DIAGNOSIS — R05 Cough: Secondary | ICD-10-CM | POA: Insufficient documentation

## 2013-01-05 DIAGNOSIS — J029 Acute pharyngitis, unspecified: Secondary | ICD-10-CM

## 2013-01-05 DIAGNOSIS — J309 Allergic rhinitis, unspecified: Secondary | ICD-10-CM | POA: Insufficient documentation

## 2013-01-05 DIAGNOSIS — Z3202 Encounter for pregnancy test, result negative: Secondary | ICD-10-CM | POA: Insufficient documentation

## 2013-01-05 DIAGNOSIS — R059 Cough, unspecified: Secondary | ICD-10-CM | POA: Insufficient documentation

## 2013-01-05 MED ORDER — DEXAMETHASONE SODIUM PHOSPHATE 10 MG/ML IJ SOLN: 10.0000 mg | Freq: Once | INTRAMUSCULAR | Status: DC

## 2013-01-05 NOTE — ED Notes (Signed)
PA at bedside.

## 2013-01-05 NOTE — Discharge Instructions (Signed)
Call for a follow up appointment with a Family or Primary Care Provider.  °Return if Symptoms worsen.   °Take medication as prescribed.  ° °

## 2013-01-05 NOTE — ED Notes (Signed)
Pt not in room when in to go over discharge instructions.

## 2013-01-05 NOTE — ED Provider Notes (Signed)
CSN: [id number]     Arrival date & time 01/05/13  1736 History   First MD Initiated Contact with Patient 01/05/13 2005   This chart was scribed for non-physician practitioner Mellody Drown, PA-C working with Nelia Shi, MD by Valera Castle, ED scribe. This patient was seen in room TR05C/TR05C and the patient's care was started at 8:07 PM.    Chief Complaint  Patient presents with  . Sore Throat   The history is provided by the patient. No language interpreter was used.   HPI Comments: Tracy Meyer is a 47 y.o. female who presents to the Emergency Department complaining of sudden, mild, constant, bilateral sore throat, onset 2 days ago. Pt states she noticed white spots over her tonsils this morning and thinks she has strep throat. She states the throat pain has remained about the same since onset. She reports associated rhinorrhea onset 1 week ago. She also reports a mild, intermittent cough. She denies having tried gargling salt water. She denies trouble swallowing, fever, chills, nausea, emesis, and any other associated symptoms. She denies any allergies to medications. She denies any medical history.   PCP - Jearld Lesch, MD  History reviewed. No pertinent past medical history. Past Surgical History  Procedure Laterality Date  . Breast biopsy  47 years old    cyst removal   Family History  Problem Relation Age of Onset  . Colon cancer Maternal Grandmother   . Brain cancer Maternal Grandmother    History  Substance Use Topics  . Smoking status: Never Smoker   . Smokeless tobacco: Never Used  . Alcohol Use: No   OB History   Grav Para Term Preterm Abortions TAB SAB Ect Mult Living                 Review of Systems  Constitutional: Negative for fever and chills.  HENT: Positive for rhinorrhea and sore throat. Negative for trouble swallowing.   Respiratory: Positive for cough.   Gastrointestinal: Negative for nausea and vomiting.  All other systems reviewed and  are negative.    Allergies  Shellfish allergy  Home Medications   Current Outpatient Rx  Name  Route  Sig  Dispense  Refill  . traMADol (ULTRAM) 50 MG tablet   Oral   Take 1 tablet (50 mg total) by mouth every 6 (six) hours as needed for pain.   15 tablet   0    BP 139/68  Pulse 73  Temp(Src) 98.5 F (36.9 C) (Oral)  Resp 18  Wt 246 lb 12.8 oz (111.948 kg)  SpO2 100%  LMP 11/05/2012  Physical Exam  Nursing note and vitals reviewed. Constitutional: She is oriented to person, place, and time. She appears well-developed and well-nourished. No distress.  HENT:  Head: Normocephalic and atraumatic.  Right Ear: Tympanic membrane, external ear and ear canal normal.  Left Ear: Tympanic membrane, external ear and ear canal normal.  Mouth/Throat: Uvula is midline and mucous membranes are normal. No oral lesions. No oropharyngeal exudate, posterior oropharyngeal edema or posterior oropharyngeal erythema.  Bilateral tonsillolith  Eyes: EOM are normal.  Neck: Neck supple. No tracheal deviation present.  Cardiovascular: Normal rate, regular rhythm and normal heart sounds.  Exam reveals no gallop and no friction rub.   No murmur heard. Pulmonary/Chest: Effort normal and breath sounds normal. No respiratory distress. She has no wheezes. She has no rales.  Musculoskeletal: Normal range of motion.  Lymphadenopathy:       Head (right side): No  submental, no submandibular, no tonsillar and no preauricular adenopathy present.       Head (left side): No submental, no submandibular, no tonsillar and no preauricular adenopathy present.  Neurological: She is alert and oriented to person, place, and time.  Skin: Skin is warm and dry.  Psychiatric: She has a normal mood and affect. Her behavior is normal.    ED Course  Procedures (including critical care time)  DIAGNOSTIC STUDIES: Oxygen Saturation is 100% on room air, normal by my interpretation.    COORDINATION OF CARE: 8:09  PM-Discussed treatment plan which includes negative strep screen with pt at bedside and pt agreed to plan.   Labs Review Labs Reviewed  RAPID STREP SCREEN  CULTURE, GROUP A STREP  POCT PREGNANCY, URINE   Imaging Review No results found.  EKG Interpretation   None      No orders of the defined types were placed in this encounter.    MDM   1. Pharyngitis    Pt presents with tonsillolith and sore throat, afebrile.  No red flags on exam.  Rapid strep negative. Patient was more concerned about the results from her urine pregnancy than anything during the encounter.  Discussed lab results treatment plan with the patient. Return precautions given. Reports understanding and no other concerns at this time.  Patient is stable for discharge at this time.     I personally performed the services described in this documentation, which was scribed in my presence. The recorded information has been reviewed and is accurate.     Clabe Seal, PA-C 01/07/13 2115

## 2013-01-05 NOTE — ED Notes (Signed)
Pt awoke with sore throat this am.  Noted white patches this afternoon.

## 2013-01-07 LAB — CULTURE, GROUP A STREP

## 2013-01-08 NOTE — ED Provider Notes (Signed)
Medical screening examination/treatment/procedure(s) were performed by non-physician practitioner and as supervising physician I was immediately available for consultation/collaboration.   Price Lachapelle L Nicholas Trompeter, MD 01/08/13 1346 

## 2013-04-07 ENCOUNTER — Encounter (HOSPITAL_COMMUNITY): Payer: Self-pay | Admitting: Emergency Medicine

## 2013-04-07 ENCOUNTER — Emergency Department (HOSPITAL_COMMUNITY): Payer: BC Managed Care – PPO

## 2013-04-07 ENCOUNTER — Emergency Department (HOSPITAL_COMMUNITY)
Admission: EM | Admit: 2013-04-07 | Discharge: 2013-04-07 | Disposition: A | Discharge status: Home / Self Care | Payer: BC Managed Care – PPO | Attending: Emergency Medicine | Admitting: Emergency Medicine

## 2013-04-07 DIAGNOSIS — M778 Other enthesopathies, not elsewhere classified: Secondary | ICD-10-CM

## 2013-04-07 DIAGNOSIS — M779 Enthesopathy, unspecified: Secondary | ICD-10-CM

## 2013-04-07 DIAGNOSIS — M65839 Other synovitis and tenosynovitis, unspecified forearm: Secondary | ICD-10-CM | POA: Insufficient documentation

## 2013-04-07 DIAGNOSIS — M65849 Other synovitis and tenosynovitis, unspecified hand: Principal | ICD-10-CM

## 2013-04-07 MED ORDER — HYDROCODONE-ACETAMINOPHEN 5-325 MG PO TABS: 1.0000 | ORAL_TABLET | Freq: Four times a day (QID) | ORAL | Status: DC | PRN

## 2013-04-07 MED ORDER — NAPROXEN 500 MG PO TABS: 500.0000 mg | ORAL_TABLET | Freq: Two times a day (BID) | ORAL | Status: DC

## 2013-04-07 MED ORDER — OXYCODONE-ACETAMINOPHEN 5-325 MG PO TABS
1.0000 | ORAL_TABLET | Freq: Once | ORAL | Status: AC
  Administered 2013-04-07: 1 via ORAL
  Filled 2013-04-07: qty 1

## 2013-04-07 NOTE — ED Notes (Signed)
Right hand with mild swelling at palmar and finger level. NO erythema or warmth. PT endorses pain in center of palm that radiates throughout hand. NO pain or radiation at wrist level.

## 2013-04-07 NOTE — ED Provider Notes (Signed)
CSN: [id number]     Arrival date & time 04/07/13  1709 History  This chart was scribed for non-physician practitioner, Jeannett Senior, PA-C working with Ezequiel Essex, MD by Frederich Balding, ED scribe. This patient was seen in room TR05C/TR05C and the patient's care was started at 7:09 PM.   Chief Complaint  Patient presents with  . Hand Pain   The history is provided by the patient. No language interpreter was used.   HPI Comments: Tracy Meyer is a 48 y.o. female who presents to the Emergency Department complaining of constant, sharp right middle finger pain with associated swelling that worsened last night. Pt states she normally has right hand pain due to a prior injury but states it worsened yesterday. Denies new injury but states she does heavy lifting at work. Bending her finger worsens the pain. She has used ice with no relief. Denies numbness or tingling.   History reviewed. No pertinent past medical history. Past Surgical History  Procedure Laterality Date  . Breast biopsy  48 years old    cyst removal  . Cholecystectomy     Family History  Problem Relation Age of Onset  . Colon cancer Maternal Grandmother   . Brain cancer Maternal Grandmother    History  Substance Use Topics  . Smoking status: Never Smoker   . Smokeless tobacco: Never Used  . Alcohol Use: No   OB History   Grav Para Term Preterm Abortions TAB SAB Ect Mult Living                 Review of Systems  Musculoskeletal: Positive for arthralgias and joint swelling.  Neurological: Negative for numbness.  All other systems reviewed and are negative.   Allergies  Shellfish allergy  Home Medications   Current Outpatient Rx  Name  Route  Sig  Dispense  Refill  . traMADol (ULTRAM) 50 MG tablet   Oral   Take 1 tablet (50 mg total) by mouth every 6 (six) hours as needed for pain.   15 tablet   0    BP 95/77  Pulse 90  Temp(Src) 99 F (37.2 C) (Oral)  Resp 16  SpO2 93%  LMP  03/30/2013  Physical Exam  Nursing note and vitals reviewed. Constitutional: She is oriented to person, place, and time. She appears well-developed and well-nourished. No distress.  HENT:  Head: Normocephalic and atraumatic.  Eyes: EOM are normal.  Neck: Neck supple. No tracheal deviation present.  Cardiovascular: Normal rate.   Pulmonary/Chest: Effort normal. No respiratory distress.  Musculoskeletal: Normal range of motion.  Swelling noted to the right hand mainly over the third and second metacarpals and MCP joints. There is no erythema, there is no tenderness to the soft touch, there is no warmth to the touch. Pain with range of motion of the fingers at that second and third MCP joints. Normal exam with no tenderness or pain distally or proximally. Normal wrist. Patient is able to make a fist and straighten all her fingers all the way. Cap refill less than 2 seconds distally.  Neurological: She is alert and oriented to person, place, and time.  Skin: Skin is warm and dry.  Psychiatric: She has a normal mood and affect. Her behavior is normal.    ED Course  Procedures (including critical care time)  DIAGNOSTIC STUDIES: Oxygen Saturation is 93% on RA, adequate by my interpretation.    COORDINATION OF CARE: 7:14 PM-Advised pt of negative xray results. Discussed treatment plan which includes  resting hand for one week and finger splint with pt at bedside and pt agreed to plan. Advised pt to follow up with a hand specialist.   Labs Review Labs Reviewed - No data to display Imaging Review Dg Hand Complete Right  04/07/2013   CLINICAL DATA:  Right hand trauma  EXAM: RIGHT HAND - COMPLETE 3+ VIEW  COMPARISON:  None.  FINDINGS: No evidence of fracture of the carpal or metacarpal bones. Radiocarpal joint is intact. Phalanges are normal. No soft tissue injury.  IMPRESSION: No acute osseous abnormality.   Electronically Signed   By: Suzy Bouchard M.D.   On: 04/07/2013 19:03     EKG  Interpretation None      MDM   Final diagnoses:  Tendonitis of right hand    Patient with nontraumatic swelling and pain to the right hand. There is no numbness or weakness in her fingers. There's pain with movement of the mainly second and third fingers at the and MCP joints. There is no signs of infection. Do not think this is gout. Suspect she has tendinitis. 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 She states she does a lot of lifting with her hands. I have explained to the patient that this is most likely tendinitis. I have placed her third and second fingers in a splint that covers per MCP joints, fingers buddy taped. I will also refer her to hand specialist. She'll need close followup. Home with Naprosyn and Norco for severe pain. X-ray was obtained and is negative.  Filed Vitals:   04/07/13 1811 04/07/13 1931  BP: 95/77 109/59  Pulse: 90 75  Temp: 99 F (37.2 C)   TempSrc: Oral   Resp: 16 18  SpO2: 93% 100%     I personally performed the services described in this documentation, which was scribed in my presence. The recorded information has been reviewed and is accurate.   Renold Genta, PA-C 04/08/13 0010

## 2013-04-07 NOTE — Progress Notes (Signed)
Orthopedic Tech Progress Note Patient Details:  Tracy Meyer Jun 04, 1965 [id number]  Ortho Devices Type of Ortho Device: Finger splint Ortho Device/Splint Location: RUE Ortho Device/Splint Interventions: Ordered;Application   Braulio Bosch 04/07/2013, 7:42 PM

## 2013-04-07 NOTE — ED Notes (Signed)
PT ambulated with baseline gait; VSS; A&Ox3; no signs of distress; respirations even and unlabored; skin warm and dry; no questions upon discharge.  

## 2013-04-07 NOTE — ED Notes (Signed)
Pt is here with right hand pain and appears to have swelling.

## 2013-04-07 NOTE — ED Notes (Signed)
Ortho Tech OTW.

## 2013-04-07 NOTE — Discharge Instructions (Signed)
Naprosyn for pain and inflammation. Norco for severe pain. Keep in splint immobilized day and night. Ice. Elevate hand. Follow up with Dr. Amedeo Plenty if not improving.   Tendinitis Tendinitis is swelling and inflammation of the tendons. Tendons are band-like tissues that connect muscle to bone. Tendinitis commonly occurs in the:   Shoulders (rotator cuff).  Heels (Achilles tendon).  Elbows (triceps tendon). CAUSES Tendinitis is usually caused by overusing the tendon, muscles, and joints involved. When the tissue surrounding a tendon (synovium) becomes inflamed, it is called tenosynovitis. Tendinitis commonly develops in people whose jobs require repetitive motions. SYMPTOMS  Pain.  Tenderness.  Mild swelling. DIAGNOSIS Tendinitis is usually diagnosed by physical exam. Your caregiver may also order X-rays or other imaging tests. TREATMENT Your caregiver may recommend certain medicines or exercises for your treatment. HOME CARE INSTRUCTIONS   Use a sling or splint for as long as directed by your caregiver until the pain decreases.  Put ice on the injured area.  Put ice in a plastic bag.  Place a towel between your skin and the bag.  Leave the ice on for 15-20 minutes, 03-04 times a day.  Avoid using the limb while the tendon is painful. Perform gentle range of motion exercises only as directed by your caregiver. Stop exercises if pain or discomfort increase, unless directed otherwise by your caregiver.  Only take over-the-counter or prescription medicines for pain, discomfort, or fever as directed by your caregiver. SEEK MEDICAL CARE IF:   Your pain and swelling increase.  You develop new, unexplained symptoms, especially increased numbness in the hands. MAKE SURE YOU:   Understand these instructions.  Will watch your condition.  Will get help right away if you are not doing well or get worse. Document Released: 01/06/2000 Document Revised: 04/02/2011 Document Reviewed:  03/27/2010 [facility] Patient Information 2014 [place], [place].

## 2013-04-08 NOTE — ED Provider Notes (Signed)
Medical screening examination/treatment/procedure(s) were performed by non-physician practitioner and as supervising physician I was immediately available for consultation/collaboration.   EKG Interpretation None        Ezequiel Essex, MD 04/08/13 [phone]

## 2013-07-27 ENCOUNTER — Emergency Department (HOSPITAL_COMMUNITY)
Admission: EM | Admit: 2013-07-27 | Discharge: 2013-07-27 | Disposition: A | Discharge status: Home / Self Care | Payer: BC Managed Care – PPO | Attending: Emergency Medicine | Admitting: Emergency Medicine

## 2013-07-27 ENCOUNTER — Encounter (HOSPITAL_COMMUNITY): Payer: Self-pay | Admitting: Emergency Medicine

## 2013-07-27 DIAGNOSIS — Z79899 Other long term (current) drug therapy: Secondary | ICD-10-CM | POA: Insufficient documentation

## 2013-07-27 DIAGNOSIS — Y9289 Other specified places as the place of occurrence of the external cause: Secondary | ICD-10-CM | POA: Insufficient documentation

## 2013-07-27 DIAGNOSIS — Y9389 Activity, other specified: Secondary | ICD-10-CM | POA: Insufficient documentation

## 2013-07-27 DIAGNOSIS — M549 Dorsalgia, unspecified: Secondary | ICD-10-CM | POA: Insufficient documentation

## 2013-07-27 DIAGNOSIS — IMO0002 Reserved for concepts with insufficient information to code with codable children: Secondary | ICD-10-CM | POA: Insufficient documentation

## 2013-07-27 DIAGNOSIS — T192XXA Foreign body in vulva and vagina, initial encounter: Secondary | ICD-10-CM | POA: Insufficient documentation

## 2013-07-27 DIAGNOSIS — Z3202 Encounter for pregnancy test, result negative: Secondary | ICD-10-CM | POA: Insufficient documentation

## 2013-07-27 LAB — URINALYSIS, ROUTINE W REFLEX MICROSCOPIC
Bilirubin Urine: NEGATIVE
Glucose, UA: NEGATIVE mg/dL
Hgb urine dipstick: NEGATIVE
Ketones, ur: NEGATIVE mg/dL
LEUKOCYTES UA: NEGATIVE
NITRITE: NEGATIVE
PROTEIN: NEGATIVE mg/dL
Specific Gravity, Urine: 1.019 (ref 1.005–1.030)
Urobilinogen, UA: 1 mg/dL (ref 0.0–1.0)
pH: 6.5 (ref 5.0–8.0)

## 2013-07-27 LAB — POC URINE PREG, ED: PREG TEST UR: NEGATIVE

## 2013-07-27 NOTE — ED Notes (Signed)
Pt reports 4 days ago she put tampon in but unsure if she took old one out. Pt now reports vaginal and back pain and thinks she has a tampon stuck.

## 2013-07-27 NOTE — Discharge Instructions (Signed)
Vaginal Foreign Body Any object found in the vagina is called a "foreign body." Any foreign body should not stay in the vagina for more than 6 hours. Bleeding, itching, pain, puffiness (swelling), or rash in the area may develop. You might get an infection even after the object is taken out. Do not try to remove the object yourself, unless it is easy to grab. HOME CARE  Do not leave tampons in for more than 6 to 8 hours. Do not wear tampons to bed. Use a pad when you sleep.  Do not put objects in the vagina for sexual activities.  Do not clean the vagina with a jet of water (douche).  Follow up with your doctor within 1 week. GET HELP RIGHT AWAY IF:   You think you left a tampon or other object in the vagina.  You have bleeding, pain, puffiness, or a bad smell coming from the vagina.  You have fluid coming from the vagina.  You have pain when you pee (urinate).  You have a fever.  You get chills, feel weak, or pass out (faint).  You have belly (abdominal) pain.  You are shaking (convulsions), start throwing up (vomiting), or have watery poop (diarrhea). MAKE SURE YOU:   Understand these instructions.  Will watch your condition.  Will get help right away if you are not doing well or get worse. Document Released: 12/27/2008 Document Revised: 04/02/2011 Document Reviewed: 11/07/2012 [facility] Patient Information 2015 [place], [place]. This information is not intended to replace advice given to you by your health care provider. Make sure you discuss any questions you have with your health care provider.

## 2013-07-27 NOTE — ED Provider Notes (Signed)
CSN: [id number]     Arrival date & time 07/27/13  1411 History   First MD Initiated Contact with Patient 07/27/13 1549     Chief Complaint  Patient presents with  . Vaginal Pain     (Consider location/radiation/quality/duration/timing/severity/associated sxs/prior Treatment) HPI Comments: The patient presents with complaints of left flank area pain. Patient reports that she might have left a tampon in place. She reports that she did use a tampon 4 days ago, cannot remember if she took it out. Since then she started having crampy pain in the left lower back, now down into the left frontal abdomen and groin area. She denies fever, chills, nausea, vomiting, diarrhea, constipation. There is no hematuria or dysuria. She does report vaginal discharge.  Patient is a 48 y.o. female presenting with vaginal pain.  Vaginal Pain    History reviewed. No pertinent past medical history. Past Surgical History  Procedure Laterality Date  . Breast biopsy  48 years old    cyst removal  . Cholecystectomy     Family History  Problem Relation Age of Onset  . Colon cancer Maternal Grandmother   . Brain cancer Maternal Grandmother    History  Substance Use Topics  . Smoking status: Never Smoker   . Smokeless tobacco: Never Used  . Alcohol Use: No   OB History   Grav Para Term Preterm Abortions TAB SAB Ect Mult Living                 Review of Systems  Genitourinary: Positive for flank pain, vaginal discharge, vaginal pain and pelvic pain.  All other systems reviewed and are negative.     Allergies  Shellfish allergy  Home Medications   Prior to Admission medications   Medication Sig Start Date End Date Taking? Authorizing Provider  vitamin B-12 (CYANOCOBALAMIN) 250 MCG tablet Take 250 mcg by mouth every morning.   Yes Historical Provider, MD   BP 119/77  Pulse 69  Temp(Src) 98.6 F (37 C) (Oral)  Resp 16  SpO2 100%  LMP 07/19/2013 Physical Exam  Constitutional: She is oriented  to person, place, and time. She appears well-developed and well-nourished. No distress.  HENT:  Head: Normocephalic and atraumatic.  Right Ear: Hearing normal.  Left Ear: Hearing normal.  Nose: Nose normal.  Mouth/Throat: Oropharynx is clear and moist and mucous membranes are normal.  Eyes: Conjunctivae and EOM are normal. Pupils are equal, round, and reactive to light.  Neck: Normal range of motion. Neck supple.  Cardiovascular: Regular rhythm, S1 normal and S2 normal.  Exam reveals no gallop and no friction rub.   No murmur heard. Pulmonary/Chest: Effort normal and breath sounds normal. No respiratory distress. She exhibits no tenderness.  Abdominal: Soft. Normal appearance and bowel sounds are normal. There is no hepatosplenomegaly. There is no tenderness. There is no rebound, no guarding, no tenderness at McBurney's point and negative Murphy's sign. No hernia.  Genitourinary: Cervix exhibits no discharge.  Retained tampon in vagina  Musculoskeletal: Normal range of motion.  Neurological: She is alert and oriented to person, place, and time. She has normal strength. No cranial nerve deficit or sensory deficit. Coordination normal. GCS eye subscore is 4. GCS verbal subscore is 5. GCS motor subscore is 6.  Skin: Skin is warm, dry and intact. No rash noted. No cyanosis.  Psychiatric: She has a normal mood and affect. Her speech is normal and behavior is normal. Thought content normal.    ED Course  Procedures (including critical  care time) Labs Review Labs Reviewed  URINALYSIS, ROUTINE W REFLEX MICROSCOPIC  POC URINE PREG, ED    Imaging Review No results found.   EKG Interpretation None      MDM   Final diagnoses:  None   vaginal foreign body  Patient presents to the ER with pelvic and back cramping, concern over retained tampon. She is afebrile, no concern for toxic shock. Examination did reveal a retained tampon which was removed with ring forceps. There is no significant  discharge. No further treatment necessary.    Orpah Greek, MD 07/27/13 [phone]

## 2014-03-02 ENCOUNTER — Encounter (HOSPITAL_COMMUNITY): Payer: Self-pay | Admitting: Emergency Medicine

## 2014-03-02 ENCOUNTER — Emergency Department (HOSPITAL_COMMUNITY)
Admission: EM | Admit: 2014-03-02 | Discharge: 2014-03-03 | Disposition: A | Discharge status: Home / Self Care | Payer: BLUE CROSS/BLUE SHIELD | Attending: Emergency Medicine | Admitting: Emergency Medicine

## 2014-03-02 DIAGNOSIS — M79671 Pain in right foot: Secondary | ICD-10-CM | POA: Insufficient documentation

## 2014-03-02 DIAGNOSIS — Z79899 Other long term (current) drug therapy: Secondary | ICD-10-CM | POA: Diagnosis not present

## 2014-03-02 DIAGNOSIS — M79672 Pain in left foot: Secondary | ICD-10-CM

## 2014-03-02 MED ORDER — KETOROLAC TROMETHAMINE 60 MG/2ML IM SOLN
60.0000 mg | Freq: Once | INTRAMUSCULAR | Status: AC
  Administered 2014-03-03: 60 mg via INTRAMUSCULAR
  Filled 2014-03-02: qty 2

## 2014-03-02 MED ORDER — NAPROXEN 500 MG PO TABS: 500.0000 mg | ORAL_TABLET | Freq: Two times a day (BID) | ORAL | Status: DC

## 2014-03-02 NOTE — ED Provider Notes (Signed)
CSN: [id number]     Arrival date & time 03/02/14  2252 History   This chart was scribed for non-physician practitioner, Britt Bottom, NP, working with [name], MD, by Jeanell Sparrow, ED Scribe. This patient was seen in room [place] and the patient's care was started at 11:33 PM.   No chief complaint on file.  The history is provided by the patient. No language interpreter was used.   HPI Comments: Tracy Meyer is a 49 y.o. female who presents to the Emergency Department complaining of constant moderate bilateral lower back pain that started tonight. She reports that his back pain and bilateral foot pain has been going on for a year, but the foot pain worsened tonight. She currently rates the severity of the pain as a 10/10. She states that she took ibuprofen without any relief. 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 She denies any fevers, difficulty walking, IVDu, saddle paresthesias, bowel or bladder incontinence.   History reviewed. No pertinent past medical history. Past Surgical History  Procedure Laterality Date  . Breast biopsy  49 years old    cyst removal  . Cholecystectomy     Family History  Problem Relation Age of Onset  . Colon cancer Maternal Grandmother   . Brain cancer Maternal Grandmother    History  Substance Use Topics  . Smoking status: Never Smoker   . Smokeless tobacco: Never Used  . Alcohol Use: No   OB History    No data available     Review of Systems  Musculoskeletal: Positive for myalgias and back pain.    Allergies  Shellfish allergy  Home Medications   Prior to Admission medications   Medication Sig Start Date End Date Taking? Authorizing Provider  vitamin B-12 (CYANOCOBALAMIN) 250 MCG tablet Take 250 mcg by mouth every morning.    Historical Provider, MD   BP 115/81 mmHg  Pulse 79  Temp(Src) 98.3 F (36.8 C) (Oral)  Resp 18  Ht 5' 6.75" (1.695 m)  SpO2 97%  LMP 02/27/2014 Physical Exam   Constitutional: She is oriented to person, place, and time. She appears well-developed and well-nourished. No distress.  HENT:  Head: Normocephalic and atraumatic.  Neck: Neck supple. No tracheal deviation present.  Cardiovascular: Normal rate.   Pulses:      Dorsalis pedis pulses are 2+ on the right side.  Pulmonary/Chest: Effort normal. No respiratory distress.  Musculoskeletal: Normal range of motion.  Trace peripheral edema at bilat lateral maleoli.   Neurological: She is alert and oriented to person, place, and time.  5/5 plantar/dorsi-flexion strength.  Skin: Skin is warm and dry.  Psychiatric: She has a normal mood and affect. Her behavior is normal.  Nursing note and vitals reviewed.   ED Course  Procedures (including critical care time) DIAGNOSTIC STUDIES: Oxygen Saturation is 97% on RA, normal by my interpretation.    COORDINATION OF CARE: 11:37 PM- Pt advised of plan for treatment which includes medication and pt agrees.  Labs Review Labs Reviewed - No data to display  Imaging Review No results found.   EKG Interpretation None      MDM   Final diagnoses:  Foot pain, bilateral   49 yo with persistent foot pain as most bothersome symptom.  Pt reports this pain is due to "nerve pain" that she is seen at the foot center and receives steroid injections.  It is unclear if pt is describing radicular symptoms causing her foot pain or possibly  some peripheral neuropathy or B-12 deficiency anemia as pt is prescribed B-12 replacement.  Discussed different possibilities and importance of follow-up with her PCP, Dr. Jimmye Norman, not just the foot center to discuss these possibilities.  I will treat with Naproxen. Pt is well-appearing, in no acute distress and vital signs reviewed and not concerning. She appears safe to be discharged.  Return precautions provided. Pt aware of plan.    I personally performed the services described in this documentation, which was scribed in my  presence. The recorded information has been reviewed and is accurate.  Filed Vitals:   03/02/14 2313  BP: 115/81  Pulse: 79  Temp: 98.3 F (36.8 C)  TempSrc: Oral  Resp: 18  Height: 5' 6.75" (1.695 m)  SpO2: 97%   Meds given in ED:  Medications  ketorolac (TORADOL) injection 60 mg (60 mg Intramuscular Given 03/03/14 0009)    Discharge Medication List as of 03/02/2014 11:51 PM    START taking these medications   Details  naproxen (NAPROSYN) 500 MG tablet Take 1 tablet (500 mg total) by mouth 2 (two) times daily., Starting 03/02/2014, Until Discontinued, Print           Britt Bottom, NP 03/04/14 1406  Ephraim Hamburger, MD 03/05/14 1827

## 2014-03-02 NOTE — Discharge Instructions (Signed)
Please follow the directions provided. Be sure to follow-up with your primary care doctor to ensure you're getting better. Please take the naproxen twice a day to help with pain. Don't hesitate to return for any new, worsening, or concerning symptoms.   SEEK IMMEDIATE MEDICAL CARE IF:  There is a sudden change in the quality of your pain, especially if the change is on only one side of the body.  You notice changes of the skin, such as redness, black or purple discoloration, swelling, or an ulcer.  You cannot move the affected limbs.

## 2014-03-02 NOTE — ED Notes (Signed)
Patient c/o bilateral foot pain and lower back pain x1 year, but became unbearable tonight. Normally takes ibuprofen, no relief tonight. Rates pain 10/10.

## 2014-06-15 ENCOUNTER — Encounter (HOSPITAL_COMMUNITY): Payer: Self-pay

## 2014-06-15 ENCOUNTER — Emergency Department (HOSPITAL_COMMUNITY)
Admission: EM | Admit: 2014-06-15 | Discharge: 2014-06-15 | Disposition: A | Discharge status: Home / Self Care | Payer: BLUE CROSS/BLUE SHIELD | Attending: Emergency Medicine | Admitting: Emergency Medicine

## 2014-06-15 DIAGNOSIS — M79671 Pain in right foot: Secondary | ICD-10-CM | POA: Diagnosis not present

## 2014-06-15 DIAGNOSIS — G8929 Other chronic pain: Secondary | ICD-10-CM | POA: Insufficient documentation

## 2014-06-15 DIAGNOSIS — Z791 Long term (current) use of non-steroidal anti-inflammatories (NSAID): Secondary | ICD-10-CM | POA: Diagnosis not present

## 2014-06-15 DIAGNOSIS — M79672 Pain in left foot: Secondary | ICD-10-CM | POA: Insufficient documentation

## 2014-06-15 DIAGNOSIS — M549 Dorsalgia, unspecified: Secondary | ICD-10-CM | POA: Diagnosis not present

## 2014-06-15 DIAGNOSIS — M79673 Pain in unspecified foot: Secondary | ICD-10-CM

## 2014-06-15 DIAGNOSIS — Z79899 Other long term (current) drug therapy: Secondary | ICD-10-CM | POA: Insufficient documentation

## 2014-06-15 MED ORDER — METHOCARBAMOL 500 MG PO TABS
1000.0000 mg | ORAL_TABLET | Freq: Once | ORAL | Status: AC
  Administered 2014-06-15: 1000 mg via ORAL
  Filled 2014-06-15: qty 2

## 2014-06-15 MED ORDER — METHOCARBAMOL 750 MG PO TABS: 750.0000 mg | ORAL_TABLET | Freq: Four times a day (QID) | ORAL | Status: DC | PRN

## 2014-06-15 NOTE — Discharge Instructions (Signed)
Read the information below.  Use the prescribed medication as directed.  Please discuss all new medications with your pharmacist.  You may return to the Emergency Department at any time for worsening condition or any new symptoms that concern you.   If you develop uncontrolled pain, weakness or numbness of the extremity, severe discoloration of the skin, or you are unable to walk, return to the ER for a recheck.       Chronic Pain Chronic pain can be defined as pain that is off and on and lasts for 3-6 months or longer. Many things cause chronic pain, which can make it difficult to make a diagnosis. There are many treatment options available for chronic pain. However, finding a treatment that works well for you may require trying various approaches until the right one is found. Many people benefit from a combination of two or more types of treatment to control their pain. SYMPTOMS  Chronic pain can occur anywhere in the body and can range from mild to very severe. Some types of chronic pain include:  Headache.  Low back pain.  Cancer pain.  Arthritis pain.  Neurogenic pain. This is pain resulting from damage to nerves. People with chronic pain may also have other symptoms such as:  Depression.  Anger.  Insomnia.  Anxiety. DIAGNOSIS  Your health care provider will help diagnose your condition over time. In many cases, the initial focus will be on excluding possible conditions that could be causing the pain. Depending on your symptoms, your health care provider may order tests to diagnose your condition. Some of these tests may include:   Blood tests.   CT scan.   MRI.   X-rays.   Ultrasounds.   Nerve conduction studies.  You may need to see a specialist.  TREATMENT  Finding treatment that works well may take time. You may be referred to a pain specialist. He or she may prescribe medicine or therapies, such as:   Mindful meditation or yoga.  Shots (injections) of  numbing or pain-relieving medicines into the spine or area of pain.  Local electrical stimulation.  Acupuncture.   Massage therapy.   Aroma, color, light, or sound therapy.   Biofeedback.   Working with a physical therapist to keep from getting stiff.   Regular, gentle exercise.   Cognitive or behavioral therapy.   Group support.  Sometimes, surgery may be recommended.  HOME CARE INSTRUCTIONS   Take all medicines as directed by your health care provider.   Lessen stress in your life by relaxing and doing things such as listening to calming music.   Exercise or be active as directed by your health care provider.   Eat a healthy diet and include things such as vegetables, fruits, fish, and lean meats in your diet.   Keep all follow-up appointments with your health care provider.   Attend a support group with others suffering from chronic pain. SEEK MEDICAL CARE IF:   Your pain gets worse.   You develop a new pain that was not there before.   You cannot tolerate medicines given to you by your health care provider.   You have new symptoms since your last visit with your health care provider.  SEEK IMMEDIATE MEDICAL CARE IF:   You feel weak.   You have decreased sensation or numbness.   You lose control of bowel or bladder function.   Your pain suddenly gets much worse.   You develop shaking.  You develop chills.  You develop confusion.  You develop chest pain.  You develop shortness of breath.  MAKE SURE YOU:  Understand these instructions.  Will watch your condition.  Will get help right away if you are not doing well or get worse. Document Released: 09/30/2001 Document Revised: 09/10/2012 Document Reviewed: 07/04/2012 [facility] Patient Information 2015 [place], [place]. This information is not intended to replace advice given to you by your health care provider. Make sure you discuss any questions you have with your health care  provider.

## 2014-06-15 NOTE — ED Notes (Signed)
Patient states she has been having pain bilateral feet since January/2016. Patient states she has intermittent swelling to bilateral feet.

## 2014-06-15 NOTE — ED Provider Notes (Signed)
CSN: [id number]     Arrival date & time 06/15/14  1548 History  This chart was scribed for a non-physician practitioner, Clayton Bibles, PA-C working with Charlesetta Shanks, MD by Martinique Peace, ED Scribe. The patient was seen in [facility]. The patient's care was started at 4:12 PM.    No chief complaint on file.     The history is provided by the patient. No language interpreter was used.    HPI Comments: Tracy Meyer is a 48 y.o. female who presents to the Emergency Department complaining of severe chronic foot pain bilaterally that radiates up her legs and into her back. She describes pain as "burning" and "cramping" sensations. Pt reports she is currently being treated for plantar fasciitis by her podiatrist who has been giving her weekly injections in her feet. She states that these injections do tend to provide some relief.  Denies any new injury or change in the location or quality of her pain.  States last night she developed severe cramping in her bilateral feet. No complaints of fever or leg swelling. She further denies any numbness or tingling in her feet. She notes she has also been given Vicodin, Naproxen, and creams to apply to foot for pain along with orthopedic shoes/boots and shoe inserts without any relief. PCP is Dr. Jimmye Norman.    No past medical history on file. Past Surgical History  Procedure Laterality Date  . Breast biopsy  49 years old    cyst removal  . Cholecystectomy     Family History  Problem Relation Age of Onset  . Colon cancer Maternal Grandmother   . Brain cancer Maternal Grandmother    History  Substance Use Topics  . Smoking status: Never Smoker   . Smokeless tobacco: Never Used  . Alcohol Use: No   OB History    No data available     Review of Systems  Constitutional: Negative for fever and chills.  Cardiovascular: Negative for leg swelling.  Musculoskeletal: Positive for back pain (chronic, unchanged). Negative for myalgias.       Bilateral foot  pain.   Skin: Negative for color change, rash and wound.  Allergic/Immunologic: Negative for immunocompromised state.  Neurological: Negative for numbness.  Hematological: Does not bruise/bleed easily.  Psychiatric/Behavioral: Negative for self-injury.      Allergies  Shellfish allergy  Home Medications   Prior to Admission medications   Medication Sig Start Date End Date Taking? Authorizing Provider  naproxen (NAPROSYN) 500 MG tablet Take 1 tablet (500 mg total) by mouth 2 (two) times daily. 03/02/14   Britt Bottom, NP  vitamin B-12 (CYANOCOBALAMIN) 250 MCG tablet Take 250 mcg by mouth every morning.    Historical Provider, MD   BP 133/73 mmHg  Pulse 70  Temp(Src) 98.6 F (37 C) (Oral)  Resp 18  SpO2 96% Physical Exam  Constitutional: She is oriented to person, place, and time. She appears well-developed and well-nourished. No distress.  HENT:  Head: Normocephalic and atraumatic.  Neck: Neck supple.  Cardiovascular: Normal rate, regular rhythm and intact distal pulses.   Pulmonary/Chest: Effort normal.  Musculoskeletal: Normal range of motion. She exhibits tenderness. She exhibits no edema.  Bilateral feet with mild diffuse tenderness. No erythema, edema, warmth, discharge, or tenderness Full active ROM of all joints. Pulses intact.   Neurological: She is alert and oriented to person, place, and time.  Skin: She is not diaphoretic.  Psychiatric: She has a normal mood and affect. Her behavior is normal.  Nursing note  and vitals reviewed.   ED Course  Procedures (including critical care time) Labs Review Labs Reviewed - No data to display  Imaging Review No results found.   EKG Interpretation None     Medications - No data to display  4:18 PM- Treatment plan was discussed with patient who verbalizes understanding and agrees.   MDM   Final diagnoses:  Chronic foot pain, unspecified laterality    Afebrile, nontoxic patient with chronic bilateral foot  pain with no new injury and no change in her chronic pain location or quality, only intensity.  Is being treated by a podiatrist.  Symptoms have been ongoing x years.  Pt requesting better pain control.   D/C home with robaxin, referral to orthopedics.  Discussed result, findings, treatment, and follow up  with patient.  Pt given return precautions.  Pt verbalizes understanding and agrees with plan.         I personally performed the services described in this documentation, which was scribed in my presence. The recorded information has been reviewed and is accurate.    Clayton Bibles, PA-C 06/15/14 1805  Charlesetta Shanks, MD 06/16/14 [id number]

## 2014-07-29 ENCOUNTER — Ambulatory Visit: Payer: Self-pay

## 2014-07-29 ENCOUNTER — Ambulatory Visit (INDEPENDENT_AMBULATORY_CARE_PROVIDER_SITE_OTHER): Payer: BLUE CROSS/BLUE SHIELD | Admitting: Podiatry

## 2014-07-29 ENCOUNTER — Encounter: Payer: Self-pay | Admitting: Podiatry

## 2014-07-29 VITALS — BP 144/98 | HR 83 | Resp 15 | Ht 66.0 in

## 2014-07-29 DIAGNOSIS — M7661 Achilles tendinitis, right leg: Secondary | ICD-10-CM

## 2014-07-29 DIAGNOSIS — M79673 Pain in unspecified foot: Secondary | ICD-10-CM

## 2014-07-29 NOTE — Progress Notes (Signed)
Subjective:     Patient ID: Gwenette Greet, female   DOB: Feb 12, 1965, 49 y.o.   MRN: [id number]  HPI patient presents stating I have had severe pain in the back of my right heel and then in my left forefoot that has also been very painful. I have seen another physician for the last 6 months and I have had numerous injections in my tendon in the back and my left forefoot without relief of symptoms and I have worn a boot which helps be temporarily with significant reoccurrence of symptoms when I don't wear it. It's been going on for over a year and I have tried different other treatments including numerous medicines and orthotics without relief   Review of Systems  All other systems reviewed and are negative.      Objective:   Physical Exam  Constitutional: She is oriented to person, place, and time.  Cardiovascular: Intact distal pulses.   Musculoskeletal: Normal range of motion.  Neurological: She is oriented to person, place, and time.  Skin: Skin is warm.  Nursing note and vitals reviewed.  neurovascular status found to be intact with muscle strength adequate range of motion the subtalar midtarsal joint within normal limits. Patient does have some discoloration of the posterior aspect of the right heel numerous injections administered by another physician and is noted to have exquisite discomfort at the insertion of the tendon right into the calcaneus. Patient on the left forefoot has discomfort when palpated but it is nondescript in nature and difficult to understand and may be due to compensatory changes in gait     Assessment:     Chronic Achilles tendinitis right with failure to respond to numerous injections and immobilization and forefoot tendinitis left    Plan:     H&P and conditions and x-rays reviewed with patient. For the right I do not recommend any further injections and I do not recommend surgery due to probable tendon changes from numerous injections and at this point the  best I can offer would be shockwave therapy to try to reduce the inflammation and hopefully cure this problem. Patient wants this done understanding risk and is scheduled for shockwave therapy right and I did discussed the treatments that we'll be obtained. I also neurovascular notes from previous doctor for me to review

## 2014-07-29 NOTE — Progress Notes (Signed)
   Subjective:    Patient ID: Tracy Meyer, female    DOB: 12/15/1965, 49 y.o.   MRN: [id number]  HPI Patient presents with bilateral foot pain; hurts to walk; can't flex foot. Has a burning sensation in feet when walking and standing every day. Patient has tried soaking feet in epsom salt and hot water with no relief. De Nurse has helped with some pain. Patient wants a second opinion on diagnosis. Pt was just seen by Dr. Charisse March from [facility] in March 2016 where she was Dx with Plantar Fasciitis. Pt also stated that she also has had weekly shots from October 2015 to March 2016 on both feet. Shots have helped with pain. Shots were given pt high blood pressure. Pt does have orthotic inserts and orthotic shoes. These do help pain a little bit. Pain got worse as of November 2015.   Review of Systems  Constitutional: Positive for unexpected weight change.  Musculoskeletal: Positive for myalgias, back pain and arthralgias.  All other systems reviewed and are negative.      Objective:   Physical Exam        Assessment & Plan:

## 2014-08-23 ENCOUNTER — Encounter: Payer: Self-pay | Admitting: Podiatry

## 2014-08-23 ENCOUNTER — Ambulatory Visit (INDEPENDENT_AMBULATORY_CARE_PROVIDER_SITE_OTHER): Payer: BLUE CROSS/BLUE SHIELD | Admitting: Podiatry

## 2014-08-23 DIAGNOSIS — M79673 Pain in unspecified foot: Secondary | ICD-10-CM

## 2014-08-23 DIAGNOSIS — M7661 Achilles tendinitis, right leg: Secondary | ICD-10-CM

## 2014-08-23 MED ORDER — PREDNISONE 10 MG PO TABS: ORAL_TABLET | ORAL | Status: DC

## 2014-08-24 NOTE — Progress Notes (Signed)
Subjective:     Patient ID: Tracy Meyer, female   DOB: 1965/10/02, 49 y.o.   MRN: [id number]  HPI patient presents stating she still having a lot of pain in the back of her Achilles tendon right and that also her feet are hurting her quite a bit in general and she has trouble walking   Review of Systems     Objective:   Physical Exam Neurovascular status intact with quite a bit of discomfort in the posterior aspect right heel central and lateral side with moderate discomfort in the feet bilateral    Assessment:     Achilles tendinitis right with inflammation bilateral feet    Plan:     Shockwave administered right heel 17 frequency 3.2 intensity to lateral and central band and I placed on a sterile prep DS 12 day Dosepak to try to reduce inflammation that's occurring in both feet A.1 week

## 2014-08-30 ENCOUNTER — Ambulatory Visit (INDEPENDENT_AMBULATORY_CARE_PROVIDER_SITE_OTHER): Payer: BLUE CROSS/BLUE SHIELD | Admitting: Podiatry

## 2014-08-30 ENCOUNTER — Encounter: Payer: Self-pay | Admitting: Podiatry

## 2014-08-30 DIAGNOSIS — M7661 Achilles tendinitis, right leg: Secondary | ICD-10-CM

## 2014-08-30 DIAGNOSIS — M779 Enthesopathy, unspecified: Secondary | ICD-10-CM | POA: Diagnosis not present

## 2014-08-30 MED ORDER — TRIAMCINOLONE ACETONIDE 10 MG/ML IJ SUSP
10.0000 mg | Freq: Once | INTRAMUSCULAR | Status: AC
  Administered 2014-08-30: 10 mg

## 2014-08-30 NOTE — Progress Notes (Signed)
Subjective:     Patient ID: Tracy Meyer, female   DOB: 01-Nov-1965, 49 y.o.   MRN: [id number]  HPI patient states my right heel seems a little bit better but my left foot seems to still be bothering me quite a bit and making it hard for me to be comfortable   Review of Systems     Objective:   Physical Exam Neurovascular status intact with diminished discomfort posterior aspect right heel secondary to shockwave and discomfort on the dorsal left foot that still present despite using the Sterapred dose pack    Assessment:     Advised on continuing Dosepak and I did inject the dorsal lateral aspect left foot around the tendon complex 3 Milligan Kenalog 5 mill grams Xylocaine and applied shockwave to the right at 4.2 intensity 17 frequency 2500 shocks to the right posterior lateral Achilles tendon     Plan:     Achilles tendinitis right improving with forefoot tendinitis left

## 2014-09-06 ENCOUNTER — Ambulatory Visit (INDEPENDENT_AMBULATORY_CARE_PROVIDER_SITE_OTHER): Payer: BLUE CROSS/BLUE SHIELD | Admitting: Podiatry

## 2014-09-06 ENCOUNTER — Encounter: Payer: Self-pay | Admitting: Podiatry

## 2014-09-06 DIAGNOSIS — M7661 Achilles tendinitis, right leg: Secondary | ICD-10-CM

## 2014-09-06 NOTE — Progress Notes (Signed)
Subjective:     Patient ID: Tracy Meyer, female   DOB: [birth year], 49 y.o.   MRN: [id number]  HPI patient states my right heel is improving but still sore with deep palpation   Review of Systems     Objective:   Physical Exam Neurovascular status intact with the lateral portion of the Achilles mildly tender when pressed right but improved    Assessment:     Achilles tendinitis improved right    Plan:     Shockwave obtained today 3000 shocks 17 frequency 4.6 intensity. Tolerated well reappoint in 4 weeks

## 2014-10-04 ENCOUNTER — Encounter: Payer: Self-pay | Admitting: Podiatry

## 2014-10-04 ENCOUNTER — Ambulatory Visit (INDEPENDENT_AMBULATORY_CARE_PROVIDER_SITE_OTHER): Payer: BLUE CROSS/BLUE SHIELD

## 2014-10-04 ENCOUNTER — Ambulatory Visit (INDEPENDENT_AMBULATORY_CARE_PROVIDER_SITE_OTHER): Payer: BLUE CROSS/BLUE SHIELD | Admitting: Podiatry

## 2014-10-04 DIAGNOSIS — S93602A Unspecified sprain of left foot, initial encounter: Secondary | ICD-10-CM | POA: Diagnosis not present

## 2014-10-04 DIAGNOSIS — M779 Enthesopathy, unspecified: Secondary | ICD-10-CM

## 2014-10-04 DIAGNOSIS — M7661 Achilles tendinitis, right leg: Secondary | ICD-10-CM

## 2014-10-04 MED ORDER — MELOXICAM 15 MG PO TABS: 15.0000 mg | ORAL_TABLET | Freq: Every day | ORAL | Status: DC

## 2014-10-04 MED ORDER — TRIAMCINOLONE ACETONIDE 10 MG/ML IJ SUSP
10.0000 mg | Freq: Once | INTRAMUSCULAR | Status: AC
  Administered 2014-10-04: 10 mg

## 2014-10-04 NOTE — Progress Notes (Signed)
Subjective:     Patient ID: Tracy Meyer, female   DOB: 06-04-1965, 49 y.o.   MRN: [id number]  HPI patient states the back of my right heel has just started to hurt me again and I'm getting pain on my left outside of the foot around the peroneal tendon and I'm not sure why   Review of Systems     Objective:   Physical Exam Neurovascular status intact muscle strength adequate with mild discomfort posterior lateral aspect of the right heel and discomfort in the peroneal insertion to the base of fifth metatarsal with no indication of muscle strength loss and mild discomfort in a more proximal direction    Assessment:     Tendinitis of the base of fifth metatarsal left along with Achilles tendinitis still present right with mild improvement    Plan:     Reviewed both conditions and x-rays of the left foot. I injected the peroneal insertion 3 mg Kenalog 5 mill grams Xylocaine left and dispensed fascial brace to bring the foot and a lateral direction along with ice therapy and I did utilize shockwave for the right 3000 shocks at 4.4 intensity 17 frequency and 1000 into the right gastroc muscle. Reappoint in 4 weeks

## 2014-11-01 ENCOUNTER — Ambulatory Visit: Payer: BLUE CROSS/BLUE SHIELD | Admitting: Podiatry

## 2014-11-08 ENCOUNTER — Ambulatory Visit: Payer: Self-pay | Admitting: Podiatry

## 2014-11-22 ENCOUNTER — Ambulatory Visit: Payer: Self-pay | Admitting: Podiatry

## 2015-03-18 ENCOUNTER — Encounter (HOSPITAL_COMMUNITY): Payer: Self-pay

## 2015-03-18 ENCOUNTER — Emergency Department (HOSPITAL_COMMUNITY)
Admission: EM | Admit: 2015-03-18 | Discharge: 2015-03-18 | Disposition: A | Discharge status: Home / Self Care | Payer: BLUE CROSS/BLUE SHIELD | Attending: Emergency Medicine | Admitting: Emergency Medicine

## 2015-03-18 ENCOUNTER — Emergency Department (HOSPITAL_COMMUNITY): Payer: BLUE CROSS/BLUE SHIELD

## 2015-03-18 DIAGNOSIS — Z791 Long term (current) use of non-steroidal anti-inflammatories (NSAID): Secondary | ICD-10-CM | POA: Insufficient documentation

## 2015-03-18 DIAGNOSIS — Z79899 Other long term (current) drug therapy: Secondary | ICD-10-CM | POA: Insufficient documentation

## 2015-03-18 DIAGNOSIS — R059 Cough, unspecified: Secondary | ICD-10-CM

## 2015-03-18 DIAGNOSIS — J069 Acute upper respiratory infection, unspecified: Secondary | ICD-10-CM | POA: Insufficient documentation

## 2015-03-18 DIAGNOSIS — R05 Cough: Secondary | ICD-10-CM

## 2015-03-18 MED ORDER — BENZONATATE 100 MG PO CAPS: 100.0000 mg | ORAL_CAPSULE | Freq: Three times a day (TID) | ORAL | Status: DC

## 2015-03-18 NOTE — ED Notes (Signed)
Patient c/o a productive cough with yellow sputum and nasal congestion. Patient also c/o low grade temp of 99.0 orally

## 2015-03-18 NOTE — ED Provider Notes (Signed)
CSN: [id number]     Arrival date & time 03/18/15  1311 History  By signing my name below, I, Tracy Meyer, attest that this documentation has been prepared under the direction and in the presence of [employer], PA-C. Electronically Signed: Rayna Meyer, ED Scribe. 03/18/2015. 2:09 PM.    Chief Complaint  Patient presents with  . Cough  . Nasal Congestion   The history is provided by the patient. No language interpreter was used.    HPI Comments: Kayte Demastus is a 50 y.o. female who presents to the Emergency Department complaining of a constant, moderate, intermittently productive cough onset 2 days ago. She reports associated sore throat that worsens when coughing, fatigue, intermittent subjective fevers (98.8 F in triage), sinus/chest congestion, voice change and CP that is only present when coughing. She has taken an allergy medication and robitussin without any significant relief. Pt denies a hx of smoking. Pt denies ear pain or any other associated symptoms at this time.    History reviewed. No pertinent past medical history. Past Surgical History  Procedure Laterality Date  . Breast biopsy  50 years old    cyst removal  . Cholecystectomy     Family History  Problem Relation Age of Onset  . Colon cancer Maternal Grandmother   . Brain cancer Maternal Grandmother    Social History  Substance Use Topics  . Smoking status: Never Smoker   . Smokeless tobacco: Never Used  . Alcohol Use: No   OB History    No data available     Review of Systems A complete 10 system review of systems was obtained and all systems are negative except as noted in the HPI and PMH.   Allergies  Shellfish allergy  Home Medications   Prior to Admission medications   Medication Sig Start Date End Date Taking? Authorizing Provider  meloxicam (MOBIC) 15 MG tablet Take 1 tablet (15 mg total) by mouth daily. 10/04/14   Wallene Huh, DPM  methocarbamol (ROBAXIN) 750 MG tablet Take 1  tablet (750 mg total) by mouth every 6 (six) hours as needed for muscle spasms (and pain). 06/15/14   Clayton Bibles, PA-C  naproxen (NAPROSYN) 500 MG tablet Take 1 tablet (500 mg total) by mouth 2 (two) times daily. 03/02/14   Britt Bottom, NP  predniSONE (DELTASONE) 10 MG tablet 12 day tapering dose 08/23/14   Wallene Huh, DPM  vitamin B-12 (CYANOCOBALAMIN) 250 MCG tablet Take 250 mcg by mouth every morning.    Historical Provider, MD   BP 138/82 mmHg  Pulse 87  Temp(Src) 98.8 F (37.1 C) (Oral)  Resp 16  SpO2 96%  LMP 03/17/2013    Physical Exam  Constitutional: She is oriented to person, place, and time. She appears well-developed and well-nourished.  HENT:  Head: Normocephalic and atraumatic.  Right Ear: Tympanic membrane and ear canal normal.  Left Ear: Tympanic membrane and ear canal normal.  Mouth/Throat: Uvula is midline, oropharynx is clear and moist and mucous membranes are normal. No oropharyngeal exudate, posterior oropharyngeal edema, posterior oropharyngeal erythema or tonsillar abscesses.  Eyes: EOM are normal.  Neck: Normal range of motion.  Cardiovascular: Normal rate, regular rhythm and normal heart sounds.  Exam reveals no gallop and no friction rub.   No murmur heard. Pulmonary/Chest: Effort normal and breath sounds normal. No respiratory distress. She has no wheezes. She has no rales.  Abdominal: Soft.  Musculoskeletal: Normal range of motion.  Neurological: She is alert and oriented to  person, place, and time.  Skin: Skin is warm and dry.  Psychiatric: She has a normal mood and affect.  Nursing note and vitals reviewed.   ED Course  Procedures  DIAGNOSTIC STUDIES: Oxygen Saturation is 96% on RA, normal by my interpretation.    COORDINATION OF CARE: 2:06 PM Discussed next steps with pt including a CXR. She verbalized understanding and is agreeable with the plan.   Labs Review Labs Reviewed - No data to display  Imaging Review Dg Chest 2  View  03/18/2015  CLINICAL DATA:  Cough today; no known cardiopulmonary problems; non smoker EXAM: CHEST - 2 VIEW COMPARISON:  01/10/2011 FINDINGS: Lungs are clear. Heart size and mediastinal contours are within normal limits. No effusion. Minimal spurring in the lower thoracic spine. IMPRESSION: No acute cardiopulmonary disease. Electronically Signed   By: Lucrezia Europe M.D.   On: 03/18/2015 14:22   I have personally reviewed and evaluated these images as part of my medical decision-making.   EKG Interpretation None      MDM   Final diagnoses:  None  Pt CXR negative for acute infiltrate. Patients symptoms are consistent with URI, likely viral etiology. Discussed that antibiotics are not indicated for viral infections. Pt will be discharged with symptomatic treatment.  Verbalizes understanding and is agreeable with plan. Pt is hemodynamically stable & in NAD prior to dc.  I personally performed the services described in this documentation, which was scribed in my presence. The recorded information has been reviewed and is accurate.   Hyman Bible, PA-C 03/18/15 [place], MD 03/20/15 [id number]

## 2015-03-18 NOTE — ED Notes (Signed)
Patient transported to X-ray 

## 2015-04-08 ENCOUNTER — Ambulatory Visit (INDEPENDENT_AMBULATORY_CARE_PROVIDER_SITE_OTHER): Payer: BLUE CROSS/BLUE SHIELD | Admitting: Podiatry

## 2015-04-08 ENCOUNTER — Ambulatory Visit (INDEPENDENT_AMBULATORY_CARE_PROVIDER_SITE_OTHER): Payer: BLUE CROSS/BLUE SHIELD

## 2015-04-08 ENCOUNTER — Encounter: Payer: Self-pay | Admitting: Podiatry

## 2015-04-08 VITALS — BP 130/76 | HR 76 | Resp 12

## 2015-04-08 DIAGNOSIS — M7661 Achilles tendinitis, right leg: Secondary | ICD-10-CM

## 2015-04-08 DIAGNOSIS — M79673 Pain in unspecified foot: Secondary | ICD-10-CM | POA: Diagnosis not present

## 2015-04-08 DIAGNOSIS — G5761 Lesion of plantar nerve, right lower limb: Secondary | ICD-10-CM

## 2015-04-08 DIAGNOSIS — D3610 Benign neoplasm of peripheral nerves and autonomic nervous system, unspecified: Secondary | ICD-10-CM

## 2015-04-08 MED ORDER — TRIAMCINOLONE ACETONIDE 10 MG/ML IJ SUSP
10.0000 mg | Freq: Once | INTRAMUSCULAR | Status: AC
  Administered 2015-04-08: 10 mg

## 2015-04-08 NOTE — Progress Notes (Signed)
Subjective:     Patient ID: Tracy Meyer, female   DOB: 1965-07-24, 50 y.o.   MRN: [id number]  HPI patient presents stating I'm still developing pain in my Achilles which got better for a while but it's now become more irritating again and I'm developing some numbness in my forefoot left with shooting pains that can go down the side of my foot and I also having trouble with my back   Review of Systems     Objective:   Physical Exam Neurovascular status intact muscle strength adequate with discomfort posterior lateral aspect right Achilles and left foot showing pain in the third interspace with radiating discomfort into the adjacent digits. Patient does have some pain in the lateral side of the foot but some of this may be due to issues with the back    Assessment:     Achilles tendinitis right reoccurring but better than previous and possible neuroma symptomatology left versus problems with the lower back    Plan:     H&P and x-rays of both feet reviewed. Today I did careful injection lateral side Achilles after explaining chances for rupture with 3 mg dexamethasone Kenalog 5 mill grams Xylocaine and advised on reduced activity. For the left I did a neuro lysis injection consisting of purified alcohol Marcaine solution 1.3 mL into the third interspace and we will see how this does over the next several weeks  X-rays reveal spur on the posterior aspect right heel that is detached with no forefoot pathology or stress fracture

## 2015-04-18 ENCOUNTER — Inpatient Hospital Stay (HOSPITAL_COMMUNITY)
Admission: EM | Admit: 2015-04-18 | Discharge: 2015-04-19 | DRG: 189 | Disposition: A | Discharge status: Home / Self Care | Payer: BLUE CROSS/BLUE SHIELD | Attending: Internal Medicine | Admitting: Internal Medicine

## 2015-04-18 ENCOUNTER — Encounter (HOSPITAL_COMMUNITY): Payer: Self-pay

## 2015-04-18 ENCOUNTER — Emergency Department (HOSPITAL_COMMUNITY): Payer: BLUE CROSS/BLUE SHIELD

## 2015-04-18 DIAGNOSIS — R0602 Shortness of breath: Secondary | ICD-10-CM | POA: Diagnosis not present

## 2015-04-18 DIAGNOSIS — E669 Obesity, unspecified: Secondary | ICD-10-CM

## 2015-04-18 DIAGNOSIS — J219 Acute bronchiolitis, unspecified: Secondary | ICD-10-CM | POA: Diagnosis not present

## 2015-04-18 DIAGNOSIS — J069 Acute upper respiratory infection, unspecified: Secondary | ICD-10-CM

## 2015-04-18 DIAGNOSIS — Z800 Family history of malignant neoplasm of digestive organs: Secondary | ICD-10-CM | POA: Diagnosis not present

## 2015-04-18 DIAGNOSIS — Z79899 Other long term (current) drug therapy: Secondary | ICD-10-CM

## 2015-04-18 DIAGNOSIS — J9600 Acute respiratory failure, unspecified whether with hypoxia or hypercapnia: Secondary | ICD-10-CM | POA: Diagnosis present

## 2015-04-18 DIAGNOSIS — Z6841 Body Mass Index (BMI) 40.0 and over, adult: Secondary | ICD-10-CM

## 2015-04-18 DIAGNOSIS — Z7952 Long term (current) use of systemic steroids: Secondary | ICD-10-CM

## 2015-04-18 DIAGNOSIS — J9601 Acute respiratory failure with hypoxia: Secondary | ICD-10-CM | POA: Diagnosis present

## 2015-04-18 DIAGNOSIS — J208 Acute bronchitis due to other specified organisms: Secondary | ICD-10-CM | POA: Diagnosis present

## 2015-04-18 DIAGNOSIS — K5900 Constipation, unspecified: Secondary | ICD-10-CM | POA: Diagnosis present

## 2015-04-18 DIAGNOSIS — Z808 Family history of malignant neoplasm of other organs or systems: Secondary | ICD-10-CM | POA: Diagnosis not present

## 2015-04-18 DIAGNOSIS — Z91013 Allergy to seafood: Secondary | ICD-10-CM | POA: Diagnosis not present

## 2015-04-18 DIAGNOSIS — R05 Cough: Secondary | ICD-10-CM

## 2015-04-18 DIAGNOSIS — R059 Cough, unspecified: Secondary | ICD-10-CM

## 2015-04-18 LAB — COMPREHENSIVE METABOLIC PANEL
ALBUMIN: 3.9 g/dL (ref 3.5–5.0)
ALK PHOS: 62 U/L (ref 38–126)
ALT: 20 U/L (ref 14–54)
ANION GAP: 10 (ref 5–15)
AST: 25 U/L (ref 15–41)
BUN: 5 mg/dL — ABNORMAL LOW (ref 6–20)
CHLORIDE: 107 mmol/L (ref 101–111)
CO2: 23 mmol/L (ref 22–32)
Calcium: 8.5 mg/dL — ABNORMAL LOW (ref 8.9–10.3)
Creatinine, Ser: 0.95 mg/dL (ref 0.44–1.00)
GFR calc non Af Amer: >60 mL/min (ref >60)
GLUCOSE: 146 mg/dL — AB (ref 65–99)
POTASSIUM: 3.6 mmol/L (ref 3.5–5.1)
SODIUM: 140 mmol/L (ref 135–145)
Total Bilirubin: 1 mg/dL (ref 0.3–1.2)
Total Protein: 6.9 g/dL (ref 6.5–8.1)

## 2015-04-18 LAB — CBC WITH DIFFERENTIAL/PLATELET
BASOS ABS: 0 10*3/uL (ref 0.0–0.1)
BASOS PCT: 0 %
EOS PCT: 0 %
Eosinophils Absolute: 0 10*3/uL (ref 0.0–0.7)
HEMATOCRIT: 39.7 % (ref 36.0–46.0)
Hemoglobin: 13.3 g/dL (ref 12.0–15.0)
Lymphocytes Relative: 16 %
Lymphs Abs: 1.5 10*3/uL (ref 0.7–4.0)
MCH: 30.6 pg (ref 26.0–34.0)
MCHC: 33.5 g/dL (ref 30.0–36.0)
MCV: 91.3 fL (ref 78.0–100.0)
MONO ABS: 0.3 10*3/uL (ref 0.1–1.0)
Monocytes Relative: 3 %
NEUTROS ABS: 7.5 10*3/uL (ref 1.7–7.7)
Neutrophils Relative %: 81 %
PLATELETS: 260 10*3/uL (ref 150–400)
RBC: 4.35 MIL/uL (ref 3.87–5.11)
RDW: 13.4 % (ref 11.5–15.5)
WBC: 9.2 10*3/uL (ref 4.0–10.5)

## 2015-04-18 LAB — I-STAT TROPONIN, ED: Troponin i, poc: 0 ng/mL (ref 0.00–0.08)

## 2015-04-18 LAB — INFLUENZA PANEL BY PCR (TYPE A & B)
H1N1FLUPCR: NOT DETECTED
INFLAPCR: NEGATIVE
INFLBPCR: NEGATIVE

## 2015-04-18 LAB — BRAIN NATRIURETIC PEPTIDE: B NATRIURETIC PEPTIDE 5: 21.7 pg/mL (ref 0.0–100.0)

## 2015-04-18 MED ORDER — AZITHROMYCIN 500 MG IV SOLR
500.0000 mg | Freq: Once | INTRAVENOUS | Status: DC
  Administered 2015-04-18: 500 mg via INTRAVENOUS
  Filled 2015-04-18: qty 500

## 2015-04-18 MED ORDER — PREDNISONE 20 MG PO TABS
60.0000 mg | ORAL_TABLET | Freq: Once | ORAL | Status: AC
  Administered 2015-04-18: 60 mg via ORAL
  Filled 2015-04-18: qty 3

## 2015-04-18 MED ORDER — ALBUTEROL SULFATE (2.5 MG/3ML) 0.083% IN NEBU: 2.5000 mg | INHALATION_SOLUTION | Freq: Four times a day (QID) | RESPIRATORY_TRACT | Status: DC | PRN

## 2015-04-18 MED ORDER — ENOXAPARIN SODIUM 60 MG/0.6ML ~~LOC~~ SOLN
60.0000 mg | SUBCUTANEOUS | Status: DC
  Administered 2015-04-18: 60 mg via SUBCUTANEOUS
  Filled 2015-04-18 (×2): qty 0.6

## 2015-04-18 MED ORDER — OSELTAMIVIR PHOSPHATE 75 MG PO CAPS
75.0000 mg | ORAL_CAPSULE | Freq: Two times a day (BID) | ORAL | Status: DC
  Administered 2015-04-18: 75 mg via ORAL
  Filled 2015-04-18 (×3): qty 1

## 2015-04-18 MED ORDER — IBUPROFEN 800 MG PO TABS
800.0000 mg | ORAL_TABLET | Freq: Once | ORAL | Status: AC
  Administered 2015-04-18: 800 mg via ORAL
  Filled 2015-04-18: qty 1

## 2015-04-18 MED ORDER — AZITHROMYCIN 250 MG PO TABS: 250.0000 mg | ORAL_TABLET | Freq: Every day | ORAL | Status: DC

## 2015-04-18 MED ORDER — DEXTROMETHORPHAN POLISTIREX ER 30 MG/5ML PO SUER
15.0000 mg | Freq: Four times a day (QID) | ORAL | Status: DC | PRN
  Filled 2015-04-18: qty 5

## 2015-04-18 MED ORDER — GUAIFENESIN-CODEINE 100-10 MG/5ML PO SOLN: 5.0000 mL | Freq: Three times a day (TID) | ORAL | Status: DC | PRN

## 2015-04-18 MED ORDER — IPRATROPIUM-ALBUTEROL 0.5-2.5 (3) MG/3ML IN SOLN
3.0000 mL | Freq: Once | RESPIRATORY_TRACT | Status: AC
  Administered 2015-04-18: 3 mL via RESPIRATORY_TRACT
  Filled 2015-04-18: qty 3

## 2015-04-18 MED ORDER — CEFTRIAXONE SODIUM 1 G IJ SOLR
1.0000 g | Freq: Once | INTRAMUSCULAR | Status: AC
  Administered 2015-04-18: 1 g via INTRAVENOUS
  Filled 2015-04-18: qty 10

## 2015-04-18 MED ORDER — PREDNISONE 20 MG PO TABS: 40.0000 mg | ORAL_TABLET | Freq: Every day | ORAL | Status: DC

## 2015-04-18 MED ORDER — ONDANSETRON 4 MG PO TBDP
4.0000 mg | ORAL_TABLET | Freq: Once | ORAL | Status: AC
  Administered 2015-04-18: 4 mg via ORAL
  Filled 2015-04-18: qty 1

## 2015-04-18 MED ORDER — ACETAMINOPHEN 500 MG PO TABS
1000.0000 mg | ORAL_TABLET | Freq: Once | ORAL | Status: AC
  Administered 2015-04-18: 1000 mg via ORAL
  Filled 2015-04-18: qty 2

## 2015-04-18 NOTE — ED Notes (Signed)
MD at bedside. 

## 2015-04-18 NOTE — H&P (Signed)
[facility] History and Physical  Tracy Meyer [id number] DOB: 08-08-1965 DOA: 04/18/2015   PCP: Harvie Junior, MD    Chief Complaint: cough and shortness of breath  HPI: Tracy Meyer is a 50 y.o. female with no PMH who presents for cough, dyspnea and body aches. Cough is non-productive. She has had abdominal cramping when she takes solid food and is constipated. No dysuria. No chest pain. Fever 100.6 in ER. Has been ambulated twice. Pulse ox drops to 88% on ambulation which is why she is being referred for admission    General: The patient denies anorexia, fever, trying to lose weight Cardiac: Denies chest pain, syncope, palpitations, pedal edema  Respiratory: Denies cough, shortness of breath, wheezing GI: Denies severe indigestion/heartburn, abdominal pain, nausea, vomiting, diarrhea + constipation GU: Denies hematuria, incontinence, dysuria  Musculoskeletal: Denies arthritis  Skin: Denies suspicious skin lesions Neurologic: Denies focal weakness or numbness, change in vision Psychiatry: Denies depression or anxiety. Hematologic: no easy bruising or bleeding  All other systems reviewed and found to be negative.  History reviewed. No pertinent past medical history.  Past Surgical History  Procedure Laterality Date  . Breast biopsy  50 years old    cyst removal  . Cholecystectomy      Social History: does not smoke or drink     Allergies  Allergen Reactions  . Shellfish Allergy Anaphylaxis    Family history:   Family History  Problem Relation Age of Onset  . Colon cancer Maternal Grandmother   . Brain cancer Maternal Grandmother     Prior to Admission medications   Medication Sig Start Date End Date Taking? Authorizing Provider  meloxicam (MOBIC) 15 MG tablet Take 15 mg by mouth daily as needed for pain.  04/07/15  Yes Historical Provider, MD  Multiple Vitamins-Minerals (MULTIVITAMIN ADULT PO) Take 1 tablet by mouth daily.   Yes Historical  Provider, MD  phentermine (ADIPEX-P) 37.5 MG tablet Take 37.5 mg by mouth daily. 04/07/15  Yes Historical Provider, MD  traMADol (ULTRAM) 50 MG tablet Take 50 mg by mouth 2 (two) times daily as needed for moderate pain or severe pain.  04/07/15  Yes Historical Provider, MD  benzonatate (TESSALON) 100 MG capsule Take 1 capsule (100 mg total) by mouth every 8 (eight) hours. Patient not taking: Reported on 04/18/2015 03/18/15   Hyman Bible, PA-C  guaiFENesin-codeine 100-10 MG/5ML syrup Take 5 mLs by mouth 3 (three) times daily as needed for cough. 04/18/15   Delsa Grana, PA-C  predniSONE (DELTASONE) 20 MG tablet Take 2 tablets (40 mg total) by mouth daily. 04/18/15   Delsa Grana, PA-C      Physical Exam: Filed Vitals:   04/18/15 1600 04/18/15 1630 04/18/15 1700 04/18/15 1730  BP: 111/66 109/61 103/65 105/68  Pulse: 93 94 99 96  Temp:      TempSrc:      Resp: 20 25 22 19   SpO2: 90% 91% 93% 92%     General: AAO x3, no distress HEENT: Normocephalic and Atraumatic, Mucous membranes pink                PERRLA; EOM intact; No scleral icterus,                 Nares: Patent, Oropharynx: Clear, Fair Dentition                 Neck: FROM, no cervical lymphadenopathy, thyromegaly, carotid bruit or JVD;  Breasts: deferred CHEST WALL: No tenderness  CHEST: Normal respiration,  clear to auscultation bilaterally - has pain when taking a deep breath HEART: Regular rate and rhythm; no murmurs rubs or gallops  BACK: No kyphosis or scoliosis; no CVA tenderness  GI: Positive Bowel Sounds, soft, non-tender; no masses, no organomegaly Rectal Exam: deferred MSK: No cyanosis, clubbing, or edema Genitalia: not examined  SKIN:  no rash or ulceration  CNS: Alert and Oriented x 4, Nonfocal exam, CN 2-12 intact  Labs on Admission:  Basic Metabolic Panel:  Recent Labs Lab 04/18/15 1513  NA 140  K 3.6  CL 107  CO2 23  GLUCOSE 146*  BUN 5*  CREATININE 0.95  CALCIUM 8.5*   Liver Function  Tests:  Recent Labs Lab 04/18/15 1513  AST 25  ALT 20  ALKPHOS 62  BILITOT 1.0  PROT 6.9  ALBUMIN 3.9   No results for input(s): LIPASE, AMYLASE in the last 168 hours. No results for input(s): AMMONIA in the last 168 hours. CBC:  Recent Labs Lab 04/18/15 1513  WBC 9.2  NEUTROABS 7.5  HGB 13.3  HCT 39.7  MCV 91.3  PLT 260   Cardiac Enzymes: No results for input(s): CKTOTAL, CKMB, CKMBINDEX, TROPONINI in the last 168 hours.  BNP (last 3 results)  Recent Labs  04/18/15 1514  BNP 21.7    ProBNP (last 3 results) No results for input(s): PROBNP in the last 8760 hours.  CBG: No results for input(s): GLUCAP in the last 168 hours.  Radiological Exams on Admission: Dg Chest 2 View  04/18/2015  CLINICAL DATA:  Rhinorrhea, cough with yellow sputum, fever, chills, body aches, fever, sore throat and shortness of breath onset Friday. EXAM: CHEST  2 VIEW COMPARISON:  Chest x-ray dated 03/18/2015. FINDINGS: Study is hypoinspiratory with crowding of the perihilar and bibasilar bronchovascular markings. Given the low lung volumes, lungs appear clear. No evidence of consolidating pneumonia. No pleural effusion or pneumothorax seen. Cardiomediastinal silhouette remains within normal limits in size and configuration. Minimal spurring again noted within the thoracic spine. No acute- appearing osseous abnormality. IMPRESSION: Low lung volumes. No evidence of acute cardiopulmonary abnormality. No evidence of pneumonia. Electronically Signed   By: Franki Cabot M.D.   On: 04/18/2015 11:08    EKG: Independently reviewed. NSR  Assessment/Plan Principal Problem:   Acute respiratory failure (HCC) - viral bronchtis- check Influenza- start Tamiflu and stop if Influenza negative - symptomatic management -f/u pulse ox   Obesity - hold Phentermine for now   Consulted:   Code Status: full code Family Communication:   DVT Prophylaxis:Lovenox  Time spent: 74 min  [place],  MD [facility]  If 7PM-7AM, please contact night-coverage [email or web address] 04/18/2015, 5:46 PM

## 2015-04-18 NOTE — ED Provider Notes (Signed)
CSN: [id number]     Arrival date & time 04/18/15  1018 History   First MD Initiated Contact with Patient 04/18/15 1125     Chief Complaint  Patient presents with  . Fever  . Cough     (Consider location/radiation/quality/duration/timing/severity/associated sxs/prior Treatment) The history is provided by the patient.     Patient is a 50 year old female with 3 days history of productive cough, fever, shortness of breath, sweats and chills with associated body aches and URI symptoms. She states that sputum has been productive, yellow to green. She endorses exertional dyspnea and orthopnea, feels generally fatigued and weak.  She has had no improvement with Robitussin, NyQuil and Tylenol and ibuprofen. She denies lower extremity edema, palpitations, PND, wheeze. She states that her chest is very sore with coughing, also has pain with deep inspiration.  She has been nauseated today, denies vomiting, abdominal pain, diarrhea or constipation. She denies smoking history. No history of asthma or bronchitis.   History reviewed. No pertinent past medical history. Past Surgical History  Procedure Laterality Date  . Breast biopsy  50 years old    cyst removal  . Cholecystectomy     Family History  Problem Relation Age of Onset  . Colon cancer Maternal Grandmother   . Brain cancer Maternal Grandmother    Social History  Substance Use Topics  . Smoking status: Never Smoker   . Smokeless tobacco: Never Used  . Alcohol Use: No   OB History    No data available     Review of Systems  Constitutional: Positive for fever, chills, diaphoresis, activity change, appetite change and fatigue. Negative for unexpected weight change.  HENT: Positive for congestion, ear pain, postnasal drip, rhinorrhea and sore throat. Negative for sinus pressure, sneezing, tinnitus and voice change.   Eyes: Negative.   Respiratory: Positive for cough, chest tightness and shortness of breath. Negative for apnea,  choking, wheezing and stridor.   Cardiovascular: Negative.  Negative for chest pain, palpitations and leg swelling.  Gastrointestinal: Positive for nausea. Negative for vomiting, abdominal pain, diarrhea, constipation, blood in stool, abdominal distention, anal bleeding and rectal pain.  Endocrine: Negative.   Genitourinary: Negative.   Musculoskeletal: Positive for myalgias. Negative for back pain, joint swelling, arthralgias, gait problem, neck pain and neck stiffness.  Skin: Negative.  Negative for color change, pallor and rash.  Allergic/Immunologic: Negative.   Neurological: Positive for weakness. Negative for dizziness, tremors, syncope, speech difficulty, light-headedness, numbness and headaches.  All other systems reviewed and are negative.     Allergies  Shellfish allergy  Home Medications   Prior to Admission medications   Medication Sig Start Date End Date Taking? Authorizing Provider  azithromycin (ZITHROMAX) 250 MG tablet Take 1 tablet (250 mg total) by mouth daily. Take first 2 tablets together, then 1 every day until finished. 04/18/15   Delsa Grana, PA-C  benzonatate (TESSALON) 100 MG capsule Take 1 capsule (100 mg total) by mouth every 8 (eight) hours. 03/18/15   Heather Laisure, PA-C  guaiFENesin-codeine 100-10 MG/5ML syrup Take 5 mLs by mouth 3 (three) times daily as needed for cough. 04/18/15   Delsa Grana, PA-C  methocarbamol (ROBAXIN) 750 MG tablet Take 1 tablet (750 mg total) by mouth every 6 (six) hours as needed for muscle spasms (and pain). 06/15/14   Clayton Bibles, PA-C  naproxen (NAPROSYN) 500 MG tablet Take 1 tablet (500 mg total) by mouth 2 (two) times daily. 03/02/14   Britt Bottom, NP  predniSONE (DELTASONE) 20 MG  tablet Take 2 tablets (40 mg total) by mouth daily. 04/18/15   Delsa Grana, PA-C  vitamin B-12 (CYANOCOBALAMIN) 250 MCG tablet Take 250 mcg by mouth every morning.    Historical Provider, MD   BP 116/64 mmHg  Pulse 101  Temp(Src) 100.4 F (38 C)  (Oral)  Resp 24  SpO2 92%  LMP 03/17/2013 Physical Exam  Constitutional: She is oriented to person, place, and time. She appears well-developed and well-nourished. She is cooperative.  Non-toxic appearance. She has a sickly appearance. She appears ill. No distress.  HENT:  Head: Normocephalic and atraumatic.  Right Ear: External ear normal.  Left Ear: External ear normal.  Mouth/Throat: No oropharyngeal exudate.  Bilateral TMs normal appearance, translucent and pearly gray with normal cone of light visible landmarks Mild posterior oropharyngeal erythema, no exudate, no uvula swelling uvula midline Nasal mucosa edematous and erythematous with clear nasal discharge  Eyes: Conjunctivae and EOM are normal. Pupils are equal, round, and reactive to light. Right eye exhibits no discharge. Left eye exhibits no discharge. No scleral icterus.  Neck: Normal range of motion. Neck supple. No JVD present. No tracheal deviation present. No thyromegaly present.  Cardiovascular: Normal rate, regular rhythm, normal heart sounds and intact distal pulses.  Exam reveals no gallop and no friction rub.   No murmur heard. Symmetrical radial pulse 2+, symmetrical dorsal pedis pulse 2+, no lower extremity edema or erythema, no calf tenderness  Pulmonary/Chest: No stridor. No respiratory distress. She has wheezes. She has rales. She exhibits tenderness.  Poor, splinted inspiratory effort, diminished BS bilaterally in low to mid lung fields, with coarse rales, expiratory wheeze associated with coughing.  Generalized chest wall ttp.  No respiratory distress, no retractions, no tachypnea.  No cyanosis, no clubbing  Abdominal: Soft. Bowel sounds are normal. She exhibits no distension and no mass. There is no tenderness. There is no rebound and no guarding.  Musculoskeletal: Normal range of motion. She exhibits no edema or tenderness.  Lymphadenopathy:    She has no cervical adenopathy.  Neurological: She is alert and  oriented to person, place, and time. She has normal reflexes. No cranial nerve deficit. She exhibits normal muscle tone. Coordination normal.  Skin: Skin is warm. No rash noted. She is diaphoretic. No erythema. No pallor.  Psychiatric: She has a normal mood and affect. Her behavior is normal. Judgment and thought content normal.  Nursing note and vitals reviewed.   ED Course  Procedures (including critical care time) Labs Review Labs Reviewed  COMPREHENSIVE METABOLIC PANEL - Abnormal; Notable for the following:    Glucose, Bld 146 (*)    BUN 5 (*)    Calcium 8.5 (*)    All other components within normal limits  CULTURE, BLOOD (ROUTINE X 2)  CULTURE, BLOOD (ROUTINE X 2)  CBC WITH DIFFERENTIAL/PLATELET  BRAIN NATRIURETIC PEPTIDE  INFLUENZA PANEL BY PCR (TYPE A & B, H1N1)  I-STAT TROPOININ, ED    Imaging Review Dg Chest 2 View  04/18/2015  CLINICAL DATA:  Rhinorrhea, cough with yellow sputum, fever, chills, body aches, fever, sore throat and shortness of breath onset Friday. EXAM: CHEST  2 VIEW COMPARISON:  Chest x-ray dated 03/18/2015. FINDINGS: Study is hypoinspiratory with crowding of the perihilar and bibasilar bronchovascular markings. Given the low lung volumes, lungs appear clear. No evidence of consolidating pneumonia. No pleural effusion or pneumothorax seen. Cardiomediastinal silhouette remains within normal limits in size and configuration. Minimal spurring again noted within the thoracic spine. No acute- appearing osseous abnormality.  IMPRESSION: Low lung volumes. No evidence of acute cardiopulmonary abnormality. No evidence of pneumonia. Electronically Signed   By: Franki Cabot M.D.   On: 04/18/2015 11:08   I have personally reviewed and evaluated these images and lab results as part of my medical decision-making.   EKG Interpretation None      MDM   Patient with 3 days of URI symptoms/flulike illness, she endorses productive cough, shortness of breath, orthopnea and  exertional dyspnea. No pulmonary or cardiac history, nonsmoker. Patient's mother is ill with similar symptoms.  On exam patient presents febrile, poor respiratory effort significantly diminished breath sounds bilaterally at the bases with scattered rhonchi and rales.  She had slight improvement in breath sounds with DuoNebs and steroids.  Chest x-ray pertinent for low lung volumes, read as no evidence of pneumonia.  Patient was ambulated after her second breathing treatment, desaturated to 88%, 90-92% on room air when at rest, with mild dyspnea.  After her third breathing treatment patient was again ambulated with desaturation to 88%.  Overall she appears clinically improved however she continues to be febrile and tachypneic.  Case was discussed with my attending physician, Dr. Billy Fischer, who agrees patient must be admitted for observation.    Dx most likely viral bronchitis, flu vs CAP, pt out of window for tamiflu.  Pt labs, BC, BNP, influenza panel, trop, and EKG added for admission.  Do not suspect PE, pt is PERC negative.  Pt appears to have cardiomegaly on CXR, however has no cardiac hx, she has both exertional dyspnea and orthopnea, chest pain is reproducible with palpation and with deep inspiration. She does not have any lower extremity edema or palpitations, do not suspect CHF.    Initial troponin was negative, BNP was negative, labs unremarkable.   Pt will be admitted to obs for further workup and tx by Dr. Wynelle Cleveland.  RN currently obtaining influenza panel.    Final diagnoses:  Cough  Shortness of breath  URI (upper respiratory infection)        Delsa Grana, PA-C 04/18/15 [place], MD 04/18/15 2145

## 2015-04-18 NOTE — ED Notes (Signed)
Pt c/o rhinorrhea, cough with yellow sputum, fever, chills, body aches, fever of 101.5, sore throat onset Friday. Pt took tylenol at 0300 today.

## 2015-04-19 DIAGNOSIS — J219 Acute bronchiolitis, unspecified: Secondary | ICD-10-CM

## 2015-04-19 MED ORDER — ALBUTEROL SULFATE (2.5 MG/3ML) 0.083% IN NEBU: 2.5000 mg | INHALATION_SOLUTION | RESPIRATORY_TRACT | Status: DC | PRN

## 2015-04-19 MED ORDER — DM-GUAIFENESIN ER 30-600 MG PO TB12: 1.0000 | ORAL_TABLET | Freq: Two times a day (BID) | ORAL | Status: DC

## 2015-04-19 MED ORDER — ALBUTEROL SULFATE HFA 108 (90 BASE) MCG/ACT IN AERS: 2.0000 | INHALATION_SPRAY | Freq: Four times a day (QID) | RESPIRATORY_TRACT | Status: DC | PRN

## 2015-04-19 MED ORDER — GUAIFENESIN-CODEINE 100-10 MG/5ML PO SOLN: 5.0000 mL | Freq: Three times a day (TID) | ORAL | Status: DC | PRN

## 2015-04-19 MED ORDER — BENZONATATE 100 MG PO CAPS: 100.0000 mg | ORAL_CAPSULE | Freq: Three times a day (TID) | ORAL | Status: DC | PRN

## 2015-04-19 NOTE — Progress Notes (Signed)
Tracy Meyer to be D/C'd Home per MD order.  Discussed prescriptions and follow up appointments with the patient. Prescriptions given to patient, medication list explained in detail. Pt verbalized understanding.    Medication List    STOP taking these medications        predniSONE 10 MG tablet  Commonly known as:  DELTASONE      TAKE these medications        albuterol 108 (90 Base) MCG/ACT inhaler  Commonly known as:  PROVENTIL HFA;VENTOLIN HFA  Inhale 2 puffs into the lungs every 6 (six) hours as needed for wheezing or shortness of breath.     benzonatate 100 MG capsule  Commonly known as:  TESSALON  Take 1 capsule (100 mg total) by mouth 3 (three) times daily as needed for cough.     guaiFENesin-codeine 100-10 MG/5ML syrup  Take 5 mLs by mouth 3 (three) times daily as needed for cough.     meloxicam 15 MG tablet  Commonly known as:  MOBIC  Take 15 mg by mouth daily as needed for pain.     MULTIVITAMIN ADULT PO  Take 1 tablet by mouth daily.     phentermine 37.5 MG tablet  Commonly known as:  ADIPEX-P  Take 37.5 mg by mouth daily.     traMADol 50 MG tablet  Commonly known as:  ULTRAM  Take 50 mg by mouth 2 (two) times daily as needed for moderate pain or severe pain.        Filed Vitals:   04/18/15 2100 04/19/15 0529  BP: 137/59 126/71  Pulse: 86 72  Temp: 98.3 F (36.8 C) 98.5 F (36.9 C)  Resp: 20 18    Skin clean, dry and intact without evidence of skin break down, no evidence of skin tears noted. IV catheter discontinued intact. Site without signs and symptoms of complications. Dressing and pressure applied. Pt denies pain at this time. No complaints noted.  An After Visit Summary was printed and given to the patient. Patient escorted via [place], and D/C home via private auto.  [name] 04/19/2015 9:48 AM

## 2015-04-19 NOTE — Progress Notes (Signed)
Pt ambulated approximately 50 ft at this time. Oxygen saturation maintained at 100% throughout, however, c/o chest tightness and continued feeling SOB but it has improved since yesturday afternoon.

## 2015-04-19 NOTE — Care Management Note (Signed)
Case Management Note  Patient Details  Name: Tracy Meyer MRN: [id number] Date of Birth: February 04, 1965  Subjective/Objective:                  ARF Action/Plan: Discharge planning Expected Discharge Date:   (unknown)               Expected Discharge Plan:  Home/Self Care  In-House Referral:     Discharge planning Services  CM Consult  Post Acute Care Choice:    Choice offered to:     DME Arranged:    DME Agency:     HH Arranged:    HH Agency:     Status of Service:  In process, will continue to follow  Medicare Important Message Given:    Date Medicare IM Given:    Medicare IM give by:    Date Additional Medicare IM Given:    Additional Medicare Important Message give by:     If discussed at [place] of Stay Meetings, dates discussed:    Additional Comments: Utilization Complete.  NO needs anticipated.  Pt indep with all ADLs and has [employer] for med needs.  No other CM needs were communicated. Dellie Catholic, RN 04/19/2015, 9:55 AM

## 2015-04-19 NOTE — Discharge Summary (Signed)
Physician Discharge Summary  Tracy Meyer [id number] DOB: September 29, 1965 DOA: 04/18/2015  PCP: Harvie Junior, MD  Admit date: 04/18/2015 Discharge date: 04/19/2015  Time spent: 50 minutes   Discharge Condition: stabl3    Discharge Diagnoses:  Principal Problem:   Acute respiratory failure ([place])   History of present illness:  Tracy Meyer is a 50 y.o. female with no PMH who presents for cough, dyspnea and body aches. Cough is non-productive. She has had abdominal cramping when she takes solid food and is constipated. No dysuria. No chest pain. Fever 100.6 in ER. Has been ambulated twice. Pulse ox drops to 88% on ambulation which is why she is being referred for admission   Hospital Course:  Viral bronchitis with hypoxia - Influenza negative - pulse ox 98% on room air today - cont symptomatic treatment for bronchitis   Discharge Exam: Filed Weights   04/18/15 1851  Weight: 117.482 kg (259 lb)   Filed Vitals:   04/18/15 2100 04/19/15 0529  BP: 137/59 126/71  Pulse: 86 72  Temp: 98.3 F (36.8 C) 98.5 F (36.9 C)  Resp: 20 18    General: AAO x 3, no distress Cardiovascular: RRR, no murmurs  Respiratory: clear to auscultation bilaterally, significant amount of cough GI: soft, non-tender, non-distended, bowel sound positive  Discharge Instructions You were cared for by a hospitalist during your hospital stay. If you have any questions about your discharge medications or the care you received while you were in the hospital after you are discharged, you can call the unit and asked to speak with the hospitalist on call if the hospitalist that took care of you is not available. Once you are discharged, your primary care physician will handle any further medical issues. Please note that NO REFILLS for any discharge medications will be authorized once you are discharged, as it is imperative that you return to your primary care physician (or establish a relationship with a  primary care physician if you do not have one) for your aftercare needs so that they can reassess your need for medications and monitor your lab values.      Discharge Instructions    Diet - low sodium heart healthy    Complete by:  As directed      Discharge instructions    Complete by:  As directed   Drink 6-8 cups of water     Increase activity slowly    Complete by:  As directed             Medication List    STOP taking these medications        predniSONE 10 MG tablet  Commonly known as:  DELTASONE      TAKE these medications        albuterol 108 (90 Base) MCG/ACT inhaler  Commonly known as:  PROVENTIL HFA;VENTOLIN HFA  Inhale 2 puffs into the lungs every 6 (six) hours as needed for wheezing or shortness of breath.     benzonatate 100 MG capsule  Commonly known as:  TESSALON  Take 1 capsule (100 mg total) by mouth 3 (three) times daily as needed for cough.     guaiFENesin-codeine 100-10 MG/5ML syrup  Take 5 mLs by mouth 3 (three) times daily as needed for cough.     meloxicam 15 MG tablet  Commonly known as:  MOBIC  Take 15 mg by mouth daily as needed for pain.     MULTIVITAMIN ADULT PO  Take 1 tablet by mouth daily.  phentermine 37.5 MG tablet  Commonly known as:  ADIPEX-P  Take 37.5 mg by mouth daily.     traMADol 50 MG tablet  Commonly known as:  ULTRAM  Take 50 mg by mouth 2 (two) times daily as needed for moderate pain or severe pain.       Allergies  Allergen Reactions  . Shellfish Allergy Anaphylaxis   Follow-up Information    Follow up with Harvie Junior, MD.   Specialty:  Family Medicine   Why:  see PCP for follow up within 1-2 weeks   Contact information:   [address] [place] [address] [phone]        The results of significant diagnostics from this hospitalization (including imaging, microbiology, ancillary and laboratory) are listed below for reference.    Significant Diagnostic Studies: Dg Chest 2  View  04/18/2015  CLINICAL DATA:  Rhinorrhea, cough with yellow sputum, fever, chills, body aches, fever, sore throat and shortness of breath onset Friday. EXAM: CHEST  2 VIEW COMPARISON:  Chest x-ray dated 03/18/2015. FINDINGS: Study is hypoinspiratory with crowding of the perihilar and bibasilar bronchovascular markings. Given the low lung volumes, lungs appear clear. No evidence of consolidating pneumonia. No pleural effusion or pneumothorax seen. Cardiomediastinal silhouette remains within normal limits in size and configuration. Minimal spurring again noted within the thoracic spine. No acute- appearing osseous abnormality. IMPRESSION: Low lung volumes. No evidence of acute cardiopulmonary abnormality. No evidence of pneumonia. Electronically Signed   By: Franki Cabot M.D.   On: 04/18/2015 11:08    Microbiology: Recent Results (from the past 240 hour(s))  Culture, blood (Routine X 2) w Reflex to ID Panel     Status: None (Preliminary result)   Collection Time: 04/18/15  3:11 PM  Result Value Ref Range Status   Specimen Description BLOOD RIGHT ARM  Final   Special Requests BOTTLES DRAWN AEROBIC AND ANAEROBIC 5CC  Final   Culture   Final    NO GROWTH < 24 HOURS Performed at [facility]    Report Status PENDING  Incomplete  Culture, blood (Routine X 2) w Reflex to ID Panel     Status: None (Preliminary result)   Collection Time: 04/18/15  3:14 PM  Result Value Ref Range Status   Specimen Description BLOOD LEFT ARM  Final   Special Requests BOTTLES DRAWN AEROBIC AND ANAEROBIC 5CC  Final   Culture   Final    NO GROWTH < 24 HOURS Performed at [facility]    Report Status PENDING  Incomplete     Labs: Basic Metabolic Panel:  Recent Labs Lab 04/18/15 1513  NA 140  K 3.6  CL 107  CO2 23  GLUCOSE 146*  BUN 5*  CREATININE 0.95  CALCIUM 8.5*   Liver Function Tests:  Recent Labs Lab 04/18/15 1513  AST 25  ALT 20  ALKPHOS 62  BILITOT 1.0  PROT 6.9  ALBUMIN  3.9   No results for input(s): LIPASE, AMYLASE in the last 168 hours. No results for input(s): AMMONIA in the last 168 hours. CBC:  Recent Labs Lab 04/18/15 1513  WBC 9.2  NEUTROABS 7.5  HGB 13.3  HCT 39.7  MCV 91.3  PLT 260   Cardiac Enzymes: No results for input(s): CKTOTAL, CKMB, CKMBINDEX, TROPONINI in the last 168 hours. BNP: BNP (last 3 results)  Recent Labs  04/18/15 1514  BNP 21.7    ProBNP (last 3 results) No results for input(s): PROBNP in the last  8760 hours.  CBG: No results for input(s): GLUCAP in the last 168 hours.     SignedDebbe Odea, MD [facility] 04/19/2015, 4:19 PM

## 2015-04-19 NOTE — Progress Notes (Signed)
SATURATION QUALIFICATIONS: (This note is used to comply with regulatory documentation for home oxygen)  Patient Saturations on Room Air at Rest = 100%  Patient Saturations on Room Air while Ambulating = 98%  Please briefly explain why patient needs home oxygen: Patient does not qualify for home oxygen use.

## 2015-04-22 ENCOUNTER — Encounter: Payer: Self-pay | Admitting: Podiatry

## 2015-04-22 ENCOUNTER — Ambulatory Visit (INDEPENDENT_AMBULATORY_CARE_PROVIDER_SITE_OTHER): Payer: BLUE CROSS/BLUE SHIELD | Admitting: Podiatry

## 2015-04-22 VITALS — BP 133/86 | HR 12

## 2015-04-22 DIAGNOSIS — M7661 Achilles tendinitis, right leg: Secondary | ICD-10-CM

## 2015-04-22 DIAGNOSIS — D3610 Benign neoplasm of peripheral nerves and autonomic nervous system, unspecified: Secondary | ICD-10-CM

## 2015-04-22 NOTE — Progress Notes (Signed)
Subjective:     Patient ID: Tracy Meyer, female   DOB: [birth year], 50 y.o.   MRN: [id number]  HPI patient states I'm doing pretty well with my right foot with minimal discomfort in the left foot still is painful but it did improve with the injection   Review of Systems     Objective:   Physical Exam Neurovascular status intact with diminished discomfort of the posterior heel right with shooting pains radiating discomfort third interspace left foot    Assessment:     Neuroma symptomatology present left with improved tendinitis right    Plan:     Continue stretching exercises for the right and did sterile prep of the left forefoot and injected directly into the nerve root with a purified alcohol Marcaine solution for left and reappoint 2 weeks

## 2015-04-23 LAB — CULTURE, BLOOD (ROUTINE X 2)
CULTURE: NO GROWTH
Culture: NO GROWTH

## 2015-04-25 ENCOUNTER — Ambulatory Visit (INDEPENDENT_AMBULATORY_CARE_PROVIDER_SITE_OTHER): Payer: BLUE CROSS/BLUE SHIELD | Admitting: Family Medicine

## 2015-04-25 ENCOUNTER — Ambulatory Visit (INDEPENDENT_AMBULATORY_CARE_PROVIDER_SITE_OTHER): Payer: BLUE CROSS/BLUE SHIELD

## 2015-04-25 VITALS — BP 126/82 | HR 81 | Temp 97.9°F | Resp 14 | Ht 66.0 in | Wt 258.0 lb

## 2015-04-25 DIAGNOSIS — R110 Nausea: Secondary | ICD-10-CM | POA: Diagnosis not present

## 2015-04-25 DIAGNOSIS — R0600 Dyspnea, unspecified: Secondary | ICD-10-CM

## 2015-04-25 DIAGNOSIS — R0609 Other forms of dyspnea: Secondary | ICD-10-CM | POA: Diagnosis not present

## 2015-04-25 DIAGNOSIS — R059 Cough, unspecified: Secondary | ICD-10-CM

## 2015-04-25 DIAGNOSIS — R05 Cough: Secondary | ICD-10-CM

## 2015-04-25 DIAGNOSIS — R5381 Other malaise: Secondary | ICD-10-CM

## 2015-04-25 LAB — POCT CBC
Granulocyte percent: 42 %G (ref 37–80)
HCT, POC: 39 % (ref 37.7–47.9)
Hemoglobin: 13.5 g/dL (ref 12.2–16.2)
Lymph, poc: 3.4 (ref 0.6–3.4)
MCH, POC: 31.1 pg (ref 27–31.2)
MCHC: 34.5 g/dL (ref 31.8–35.4)
MCV: 89.9 fL (ref 80–97)
MID (cbc): 0.5 (ref 0–0.9)
MPV: 6.8 fL (ref 0–99.8)
POC Granulocyte: 2.9 (ref 2–6.9)
POC LYMPH PERCENT: 50.2 %L — AB (ref 10–50)
POC MID %: 7.8 %M (ref 0–12)
Platelet Count, POC: 350 10*3/uL (ref 142–424)
RBC: 4.34 M/uL (ref 4.04–5.48)
RDW, POC: 14.2 %
WBC: 6.8 10*3/uL (ref 4.6–10.2)

## 2015-04-25 LAB — POCT SEDIMENTATION RATE: POCT SED RATE: 24 mm/hr — AB (ref 0–22)

## 2015-04-25 LAB — TSH: TSH: 2.11 mIU/L

## 2015-04-25 MED ORDER — PREDNISONE 20 MG PO TABS: ORAL_TABLET | ORAL | Status: DC

## 2015-04-25 NOTE — Progress Notes (Signed)
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  She still does not feel well.  She is nauseated and dyspneic on exertion.  She has some chest tightness.  She is taking tessalon and mucinex.  Patient has been unable to get in with her PCP [facility] Ram.  Patient Active Problem List   Diagnosis Date Noted  . Acute respiratory failure ([place]) 04/18/2015  . Chronic cholecystitis with calculus 08/06/2011   No asthma or h/o pneumonia.  Not a smoker  No energy No vomiting No fever No diarrhea. Urination is normal  Objective:  NAD at rest BP 126/82 mmHg  Pulse 81  Temp(Src) 97.9 F (36.6 C) (Oral)  Resp 14  Ht 5\' 6"  (1.676 m)  Wt 258 lb (117.028 kg)  BMI 41.66 kg/m2  SpO2 96%  LMP 03/17/2013  Oroph: clear Tm"s: normal Chest:  ronchi which partially clear with cough Heart:  Reg with no murmur Ext:  No edema  Results for orders placed or performed in visit on 04/25/15  POCT CBC  Result Value Ref Range   WBC 6.8 4.6 - 10.2 K/uL   Lymph, poc 3.4 0.6 - 3.4   POC LYMPH PERCENT 50.2 (A) 10 - 50 %L   MID (cbc) 0.5 0 - 0.9   POC MID % 7.8 0 - 12 %M   POC Granulocyte 2.9 2 - 6.9   Granulocyte percent 42.0 37 - 80 %G   RBC 4.34 4.04 - 5.48 M/uL   Hemoglobin 13.5 12.2 - 16.2 g/dL   HCT, POC 39.0 37.7 - 47.9 %   MCV 89.9 80 - 97 fL   MCH, POC 31.1 27 - 31.2 pg   MCHC 34.5 31.8 - 35.4 g/dL   RDW, POC 14.2 %   Platelet Count, POC 350 142 - 424 K/uL   MPV 6.8 0 - 99.8 fL  CLINICAL DATA: Dyspnea on exertion  EXAM: CHEST 2 VIEW  COMPARISON: None.  FINDINGS: Increased markings at the bases are streaky and likely bronchitic. Asymmetric density in the retrocardiac lung only seen in the frontal projection - no consolidation. No effusion or air leak. Normal heart size mediastinal contours.  IMPRESSION: Bronchitic markings.   Electronically Signed  By:  Monte Fantasia M.D.  On: 04/25/2015 11:31  Assessment:  Even though your flu test was negative last week, your symptoms are very consistent with an acute influenza infection. At this point you're not contagious but I expect you will feel somewhat sick for the next several days.  I will you to rest. I'm giving you some medicine that hopefully will help open appear lungs and give you more energy. I will you to return on Wednesday if you're not significantly better.   Cough - Plan: DG Chest 2 View, POCT CBC, POCT SEDIMENTATION RATE, TSH, predniSONE (DELTASONE) 20 MG tablet  Dyspnea on exertion - Plan: DG Chest 2 View, POCT CBC, POCT SEDIMENTATION RATE, TSH, predniSONE (DELTASONE) 20 MG tablet  Malaise - Plan: POCT SEDIMENTATION RATE, TSH, predniSONE (DELTASONE) 20 MG tablet  Nausea without vomiting - Plan: POCT SEDIMENTATION RATE, TSH, predniSONE (DELTASONE) 20 MG tablet  Robyn Haber, MD

## 2015-04-25 NOTE — Patient Instructions (Addendum)
Even though your flu test was negative last week, your symptoms are very consistent with an acute influenza infection. At this point you're not contagious but I expect you will feel somewhat sick for the next several days.  I will you to rest. I'm giving you some medicine that hopefully will help open appear lungs and give you more energy. I will you to return on Wednesday if you're not significantly better.    IF you received an x-ray today, you will receive an invoice from [facility]. Please contact [facility] at [phone] with questions or concerns regarding your invoice.   IF you received labwork today, you will receive an invoice from [employer]. Please contact [facility] at [phone] with questions or concerns regarding your invoice.   Our billing staff will not be able to assist you with questions regarding bills from these companies.  You will be contacted with the lab results as soon as they are available. The fastest way to get your results is to activate your My Chart account. Instructions are located on the last page of this paperwork. If you have not heard from [place] regarding the results in 2 weeks, please contact this office.

## 2015-05-11 ENCOUNTER — Encounter: Payer: Self-pay | Admitting: *Deleted

## 2015-05-13 ENCOUNTER — Ambulatory Visit: Payer: BLUE CROSS/BLUE SHIELD | Admitting: Podiatry

## 2015-05-19 ENCOUNTER — Encounter: Payer: Self-pay | Admitting: Podiatry

## 2015-05-19 ENCOUNTER — Ambulatory Visit (INDEPENDENT_AMBULATORY_CARE_PROVIDER_SITE_OTHER): Payer: BLUE CROSS/BLUE SHIELD | Admitting: Podiatry

## 2015-05-19 VITALS — BP 108/74 | HR 76 | Resp 12

## 2015-05-19 DIAGNOSIS — M779 Enthesopathy, unspecified: Secondary | ICD-10-CM | POA: Diagnosis not present

## 2015-05-19 MED ORDER — TRIAMCINOLONE ACETONIDE 10 MG/ML IJ SUSP
10.0000 mg | Freq: Once | INTRAMUSCULAR | Status: AC
Start: 2015-05-19 — End: 2015-05-19
  Administered 2015-05-19: 10 mg

## 2015-05-20 NOTE — Progress Notes (Signed)
Subjective:     Patient ID: Tracy Meyer, female   DOB: [birth year], 50 y.o.   MRN: [id number]  HPI patient states still having a lot of pain in this left foot but it seems to be slightly different   Review of Systems     Objective:   Physical Exam Neurovascular status intact with patient still having mild discomfort in the third intermetatarsal space left but I did note that there seems to be quite a bit of discomfort in the third metatarsal phalangeal joint left with mild discomfort in the fourth metatarsophalangeal joint left    Assessment:     Possibility for capsulitis versus neuroma symptomatology    Plan:     Explained conditions and also explaining the back involvement with the possibility that that may be causing irritation. I did a proximal nerve block I then aspirated the third MPJ getting out of small amount of clear fluid and injected with half cc of dexamethasone Kenalog and applied pad to reduce pressure on the joint surface. Reappoint to recheck again in 2 weeks

## 2015-06-09 ENCOUNTER — Ambulatory Visit: Payer: BLUE CROSS/BLUE SHIELD | Admitting: Podiatry

## 2015-06-16 ENCOUNTER — Ambulatory Visit (INDEPENDENT_AMBULATORY_CARE_PROVIDER_SITE_OTHER): Payer: BLUE CROSS/BLUE SHIELD | Admitting: Podiatry

## 2015-06-16 ENCOUNTER — Encounter: Payer: Self-pay | Admitting: Podiatry

## 2015-06-16 DIAGNOSIS — M779 Enthesopathy, unspecified: Secondary | ICD-10-CM

## 2015-06-16 DIAGNOSIS — M7661 Achilles tendinitis, right leg: Secondary | ICD-10-CM | POA: Diagnosis not present

## 2015-06-16 MED ORDER — TRIAMCINOLONE ACETONIDE 10 MG/ML IJ SUSP
10.0000 mg | Freq: Once | INTRAMUSCULAR | Status: AC
  Administered 2015-06-16: 10 mg

## 2015-06-16 NOTE — Progress Notes (Signed)
Subjective:     Patient ID: Tracy Meyer, female   DOB: 1965/02/27, 50 y.o.   MRN: [id number]  HPI patient states she seems to be feeling quite a bit better in her forefoot but she's having pain on top of her foot and it feels like her arch has dropped   Review of Systems     Objective:   Physical Exam Neurovascular status intact with diminished discomfort in the third metatarsalgia joint left with mild discomfort third interspace left but pain more in the top of the left foot with inflammation and fluid around the tendon complex. Patient does have moderate depression of the arch    Assessment:     Tendinitis-like condition with possibility still for neuroma symptomatology or inflamed joint    Plan:     Reviewed condition and did careful dorsal injection 3 mg Kenalog 5 mill grams Xylocaine and dispensed fascial brace with instructions on usage. Patient will be seen back in 5 weeks or earlier if needed

## 2015-06-18 ENCOUNTER — Encounter: Payer: Self-pay | Admitting: *Deleted

## 2015-06-18 ENCOUNTER — Ambulatory Visit (INDEPENDENT_AMBULATORY_CARE_PROVIDER_SITE_OTHER): Payer: BLUE CROSS/BLUE SHIELD | Admitting: Family Medicine

## 2015-06-18 VITALS — BP 118/88 | HR 77 | Temp 98.3°F | Resp 16 | Ht 66.5 in | Wt 262.0 lb

## 2015-06-18 DIAGNOSIS — J040 Acute laryngitis: Secondary | ICD-10-CM | POA: Diagnosis not present

## 2015-06-18 DIAGNOSIS — B3731 Acute candidiasis of vulva and vagina: Secondary | ICD-10-CM

## 2015-06-18 DIAGNOSIS — N898 Other specified noninflammatory disorders of vagina: Secondary | ICD-10-CM

## 2015-06-18 DIAGNOSIS — H6691 Otitis media, unspecified, right ear: Secondary | ICD-10-CM | POA: Diagnosis not present

## 2015-06-18 DIAGNOSIS — B373 Candidiasis of vulva and vagina: Secondary | ICD-10-CM

## 2015-06-18 DIAGNOSIS — J029 Acute pharyngitis, unspecified: Secondary | ICD-10-CM

## 2015-06-18 LAB — POCT WET + KOH PREP: TRICH BY WET PREP: ABSENT

## 2015-06-18 LAB — POCT RAPID STREP A (OFFICE): Rapid Strep A Screen: NEGATIVE

## 2015-06-18 MED ORDER — FLUCONAZOLE 150 MG PO TABS: 150.0000 mg | ORAL_TABLET | Freq: Once | ORAL | Status: DC

## 2015-06-18 MED ORDER — MAGIC MOUTHWASH W/LIDOCAINE: 5.0000 mL | Freq: Four times a day (QID) | ORAL | Status: DC | PRN

## 2015-06-18 NOTE — Progress Notes (Addendum)
By signing my name below I, [name], attest that this documentation has been prepared under the direction and in the presence of [name], MD. Electonically Signed. [name], Scribe 06/18/2015 at 8:56 AM  Subjective:    Patient ID: Tracy Meyer, female    DOB: November 28, 1965, 50 y.o.   MRN: [id number]  Chief Complaint  Patient presents with  . Vaginal Itching  . Ear Pain    HPI Tracy Meyer is a 50 y.o. female who presents to the [facility] complaining of throat pain for the past 5 days and rt ear pain for the past 4 days. Pt is whispering, stating that she has lost her voice for the past 3 days. Pt went to see her PCP 3 days ago and again 2 days ago. Pt's throat was red during those visits. Pt has since developed white spots in her throat. Pt had a negative strep test. Pt was put on amoxicillin and stopped taking it after 2 days because it "was not working." Pt has runny nose.  Pt's mother is possibly sick, per pt.  Pt also c/o vaginal itching for the past 5 days. Pt denies any vaginal discharge. Pt states that she just started using a new toilet paper. Pt has been using disinit cream to the external vaginal area with some relief.  Patient Active Problem List   Diagnosis Date Noted  . Acute respiratory failure ([place]) 04/18/2015  . Chronic cholecystitis with calculus 08/06/2011   History reviewed. No pertinent past medical history. Past Surgical History  Procedure Laterality Date  . Breast biopsy  50 years old    cyst removal  . Cholecystectomy     Allergies  Allergen Reactions  . Shellfish Allergy Anaphylaxis   Prior to Admission medications   Medication Sig Start Date End Date Taking? Authorizing Provider  Multiple Vitamins-Minerals (MULTIVITAMIN ADULT PO) Take 1 tablet by mouth daily. Reported on 04/25/2015    Historical Provider, MD   Social History   Social History  . Marital Status: Single    Spouse Name: N/A  . Number of  Children: N/A  . Years of Education: N/A   Occupational History  . Not on file.   Social History Main Topics  . Smoking status: Never Smoker   . Smokeless tobacco: Never Used  . Alcohol Use: No  . Drug Use: No  . Sexual Activity: No   Other Topics Concern  . Not on file   Social History Narrative      Review of Systems  HENT: Positive for ear pain and sore throat.   Genitourinary:       Positive for vaginal itching.       Objective:   Physical Exam  Constitutional: She is oriented to person, place, and time. She appears well-developed and well-nourished. No distress.  HENT:  Head: Normocephalic and atraumatic.  Right Ear: External ear and ear canal normal. Tympanic membrane is erythematous (with discolored effusion) and retracted.  Left Ear: Hearing, tympanic membrane, external ear and ear canal normal.  Nose: Nose normal.  Mouth/Throat: Oropharynx is clear and moist. No oropharyngeal exudate.  Pt has tonsilliths. No apparent exudate.  Eyes: Conjunctivae and EOM are normal. Pupils are equal, round, and reactive to light.  Cardiovascular: Normal rate, regular rhythm, normal heart sounds and intact distal pulses.   No murmur heard. Pulmonary/Chest: Effort normal and breath sounds normal. No respiratory distress. She has no decreased breath sounds. She has no wheezes. She has  no rhonchi.  Genitourinary:  Topical cream external labia. Minimal white discharge in vagina. No bleeding. No cervical motion tender.  Lymphadenopathy:    She has cervical adenopathy.       Left cervical: Posterior cervical adenopathy present.  Neurological: She is alert and oriented to person, place, and time.  Skin: Skin is warm and dry. No rash noted.  Psychiatric: She has a normal mood and affect. Her behavior is normal.  Vitals reviewed.    Filed Vitals:   06/18/15 0812  BP: 118/88  Pulse: 77  Temp: 98.3 F (36.8 C)  TempSrc: Oral  Resp: 16  Height: 5' 6.5" (1.689 m)  Weight:  262 lb (118.842 kg)  SpO2: 97%   Results for orders placed or performed in visit on 06/18/15  POCT rapid strep A  Result Value Ref Range   Rapid Strep A Screen Negative Negative  POCT Wet + KOH Prep  Result Value Ref Range   Yeast by KOH Present Present, Absent   Yeast by wet prep Present Present, Absent   WBC by wet prep Many (A) None, Few, Too numerous to count   Clue Cells Wet Prep HPF POC Few (A) None, Too numerous to count   Trich by wet prep Absent Present, Absent   Bacteria Wet Prep HPF POC Many (A) None, Few, Too numerous to count   Epithelial Cells By [employer] ([facility]) Many (A) None, Few, Too numerous to count   RBC,UR,HPF,POC None None RBC/hpf        Assessment & Plan:   Tracy Meyer is a 50 y.o. female Sore throat - Plan: POCT rapid strep A, Culture, Group A Strep, magic mouthwash w/lidocaine SOLN Laryngitis Acute right otitis media, recurrence not specified, unspecified otitis media type  - Suspected viral illness, less likely strep throat, but recent partial treatment with amoxicillin. Throat culture pending. Now with right otitis media.  -Restart amoxicillin as previously prescribed. Symptomatic care discussed, and magic mouthwash provided if needed.   Vaginal irritation - Plan: POCT Wet + KOH Prep Candida vaginitis - Plan: fluconazole (DIFLUCAN) 150 MG tablet  -Possible combination of candidal vaginitis with external abrasion and irritation from new toilet paper. Okay to apply Desitin externally, Diflucan 150 mg 1 now and once again after completion of antibiotic. rtc precautions.   Meds ordered this encounter  Medications  . Olopatadine HCl 0.6 % SOLN    Sig:     Refill:  0  . amoxicillin (AMOXIL) 500 MG capsule    Sig: Take 500 mg by mouth 3 (three) times daily.  . magic mouthwash w/lidocaine SOLN    Sig: Take 5 mLs by mouth 4 (four) times daily as needed for mouth pain.    Dispense:  120 mL    Refill:  0    Ok to substitute ingredients per pharmacy  usual "magic mouthwash" prep.  . fluconazole (DIFLUCAN) 150 MG tablet    Sig: Take 1 tablet (150 mg total) by mouth once. Then repeat in 1 week.    Dispense:  2 tablet    Refill:  0   Patient Instructions       IF you received an x-ray today, you will receive an invoice from [facility]. Please contact [facility] at [phone] with questions or concerns regarding your invoice.   IF you received labwork today, you will receive an invoice from [employer]. Please contact [facility] at [phone] with questions or concerns regarding your invoice.   Our billing staff  will not be able to assist you with questions regarding bills from these companies.  You will be contacted with the lab results as soon as they are available. The fastest way to get your results is to activate your My Chart account. Instructions are located on the last page of this paperwork. If you have not heard from [place] regarding the results in 2 weeks, please contact this office.    Your laryngitis and other respiratory symptoms are likely due to a virus, but her right ear does look like he may have an early infection. Restart amoxicillin, as this would also cover if you have strep throat. Cepacol or other cough drops/sore throat lozenges and Tylenol or Motrin as needed for sore throat, and make sure you're drinking plenty of fluids. If you need it, I did prescribe some Magic mouthwash for sore throat.  The vaginal irritation may be due to combination of a yeast infection as well as the abrasion or irritation from toilet paper recently, okay to use Desitin ointment if this helps temporarily. I also prescribed Diflucan with a repeat dose in one week or after completion of antibiotic.  Return to the clinic or go to the nearest emergency room if any of your symptoms worsen or new symptoms occur.   Laryngitis Laryngitis is inflammation of your vocal cords. This causes hoarseness,  coughing, loss of voice, sore throat, or a dry throat. Your vocal cords are two bands of muscles that are found in your throat. When you speak, these cords come together and vibrate. These vibrations come out through your mouth as sound. When your vocal cords are inflamed, your voice sounds different. Laryngitis can be temporary (acute) or long-term (chronic). Most cases of acute laryngitis improve with time. Chronic laryngitis is laryngitis that lasts for more than three weeks. CAUSES Acute laryngitis may be caused by:  A viral infection.  Lots of talking, yelling, or singing. This is also called vocal strain.  Bacterial infections. Chronic laryngitis may be caused by:  Vocal strain.  Injury to your vocal cords.  Acid reflux (gastroesophageal reflux disease or GERD).  Allergies.  Sinus infection.  Smoking.  Alcohol abuse.  Breathing in chemicals or dust.  Growths on the vocal cords. RISK FACTORS Risk factors for laryngitis include:  Smoking.  Alcohol abuse.  Having allergies. SIGNS AND SYMPTOMS Symptoms of laryngitis may include:  Low, hoarse voice.  Loss of voice.  Dry cough.  Sore throat.  Stuffy nose. DIAGNOSIS Laryngitis may be diagnosed by:  Physical exam.  Throat culture.  Blood test.  Laryngoscopy. This procedure allows your health care provider to look at your vocal cords with a mirror or viewing tube. TREATMENT Treatment for laryngitis depends on what is causing it. Usually, treatment involves resting your voice and using medicines to soothe your throat. However, if your laryngitis is caused by a bacterial infection, you may need to take antibiotic medicine. If your laryngitis is caused by a growth, you may need to have a procedure to remove it. HOME CARE INSTRUCTIONS  Drink enough fluid to keep your urine clear or pale yellow.  Breathe in moist air. Use a humidifier if you live in a dry climate.  Take medicines only as directed by your  health care provider.  If you were prescribed an antibiotic medicine, finish it all even if you start to feel better.  Do not smoke cigarettes or electronic cigarettes. If you need help quitting, ask your health care provider.  Talk as little as  possible. Also avoid whispering, which can cause vocal strain.  Write instead of talking. Do this until your voice is back to normal. SEEK MEDICAL CARE IF:  You have a fever.  You have increasing pain.  You have difficulty swallowing. SEEK IMMEDIATE MEDICAL CARE IF:  You cough up blood.  You have trouble breathing.   This information is not intended to replace advice given to you by your health care provider. Make sure you discuss any questions you have with your health care provider.   Document Released: 01/08/2005 Document Revised: 01/29/2014 Document Reviewed: 06/23/2013 Elsevier Interactive Patient Education 2016 Elsevier Inc. Otitis Media, Adult Otitis media is redness, soreness, and inflammation of the middle ear. Otitis media may be caused by allergies or, most commonly, by infection. Often it occurs as a complication of the common cold. SIGNS AND SYMPTOMS Symptoms of otitis media may include:  Earache.  Fever.  Ringing in your ear.  Headache.  Leakage of fluid from the ear. DIAGNOSIS To diagnose otitis media, your health care provider will examine your ear with an otoscope. This is an instrument that allows your health care provider to see into your ear in order to examine your eardrum. Your health care provider also will ask you questions about your symptoms. TREATMENT  Typically, otitis media resolves on its own within 3-5 days. Your health care provider may prescribe medicine to ease your symptoms of pain. If otitis media does not resolve within 5 days or is recurrent, your health care provider may prescribe antibiotic medicines if he or she suspects that a bacterial infection is the cause. HOME CARE INSTRUCTIONS   If  you were prescribed an antibiotic medicine, finish it all even if you start to feel better.  Take medicines only as directed by your health care provider.  Keep all follow-up visits as directed by your health care provider. SEEK MEDICAL CARE IF:  You have otitis media only in one ear, or bleeding from your nose, or both.  You notice a lump on your neck.  You are not getting better in 3-5 days.  You feel worse instead of better. SEEK IMMEDIATE MEDICAL CARE IF:   You have pain that is not controlled with medicine.  You have swelling, redness, or pain around your ear or stiffness in your neck.  You notice that part of your face is paralyzed.  You notice that the bone behind your ear (mastoid) is tender when you touch it. MAKE SURE YOU:   Understand these instructions.  Will watch your condition.  Will get help right away if you are not doing well or get worse.   This information is not intended to replace advice given to you by your health care provider. Make sure you discuss any questions you have with your health care provider.   Document Released: 10/14/2003 Document Revised: 01/29/2014 Document Reviewed: 08/05/2012 Elsevier Interactive Patient Education 2016 Elsevier Inc.   Monilial Vaginitis Vaginitis in a soreness, swelling and redness (inflammation) of the vagina and vulva. Monilial vaginitis is not a sexually transmitted infection. CAUSES  Yeast vaginitis is caused by yeast (candida) that is normally found in your vagina. With a yeast infection, the candida has overgrown in number to a point that upsets the chemical balance. SYMPTOMS   White, thick vaginal discharge.  Swelling, itching, redness and irritation of the vagina and possibly the lips of the vagina (vulva).  Burning or painful urination.  Painful intercourse. DIAGNOSIS  Things that may contribute to monilial vaginitis are:  Postmenopausal and virginal states.  Pregnancy.  Infections.  Being  tired, sick or stressed, especially if you had monilial vaginitis in the past.  Diabetes. Good control will help lower the chance.  Birth control pills.  Tight fitting garments.  Using bubble bath, feminine sprays, douches or deodorant tampons.  Taking certain medications that kill germs (antibiotics).  Sporadic recurrence can occur if you become ill. TREATMENT  Your caregiver will give you medication.  There are several kinds of anti monilial vaginal creams and suppositories specific for monilial vaginitis. For recurrent yeast infections, use a suppository or cream in the vagina 2 times a week, or as directed.  Anti-monilial or steroid cream for the itching or irritation of the vulva may also be used. Get your caregiver's permission.  Painting the vagina with methylene blue solution may help if the monilial cream does not work.  Eating yogurt may help prevent monilial vaginitis. HOME CARE INSTRUCTIONS   Finish all medication as prescribed.  Do not have sex until treatment is completed or after your caregiver tells you it is okay.  Take warm sitz baths.  Do not douche.  Do not use tampons, especially scented ones.  Wear cotton underwear.  Avoid tight pants and panty hose.  Tell your sexual partner that you have a yeast infection. They should go to their caregiver if they have symptoms such as mild rash or itching.  Your sexual partner should be treated as well if your infection is difficult to eliminate.  Practice safer sex. Use condoms.  Some vaginal medications cause latex condoms to fail. Vaginal medications that harm condoms are:  Cleocin cream.  Butoconazole (Femstat).  Terconazole (Terazol) vaginal suppository.  Miconazole (Monistat) (may be purchased over the counter). SEEK MEDICAL CARE IF:   You have a temperature by mouth above 102 F (38.9 C).  The infection is getting worse after 2 days of treatment.  The infection is not getting better after  3 days of treatment.  You develop blisters in or around your vagina.  You develop vaginal bleeding, and it is not your menstrual period.  You have pain when you urinate.  You develop intestinal problems.  You have pain with sexual intercourse.   This information is not intended to replace advice given to you by your health care provider. Make sure you discuss any questions you have with your health care provider.   Document Released: 10/18/2004 Document Revised: 04/02/2011 Document Reviewed: 07/12/2014 Elsevier Interactive Patient Education [employer].     I personally performed the services described in this documentation, which was scribed in my presence. The recorded information has been reviewed and considered, and addended by me as needed.

## 2015-06-18 NOTE — Patient Instructions (Addendum)
IF you received an x-ray today, you will receive an invoice from [facility]. Please contact [facility] at [phone] with questions or concerns regarding your invoice.   IF you received labwork today, you will receive an invoice from [employer]. Please contact [facility] at [phone] with questions or concerns regarding your invoice.   Our billing staff will not be able to assist you with questions regarding bills from these companies.  You will be contacted with the lab results as soon as they are available. The fastest way to get your results is to activate your My Chart account. Instructions are located on the last page of this paperwork. If you have not heard from [place] regarding the results in 2 weeks, please contact this office.    Your laryngitis and other respiratory symptoms are likely due to a virus, but her right ear does look like he may have an early infection. Restart amoxicillin, as this would also cover if you have strep throat. Cepacol or other cough drops/sore throat lozenges and Tylenol or Motrin as needed for sore throat, and make sure you're drinking plenty of fluids. If you need it, I did prescribe some Magic mouthwash for sore throat.  The vaginal irritation may be due to combination of a yeast infection as well as the abrasion or irritation from toilet paper recently, okay to use Desitin ointment if this helps temporarily. I also prescribed Diflucan with a repeat dose in one week or after completion of antibiotic.  Return to the clinic or go to the nearest emergency room if any of your symptoms worsen or new symptoms occur.   Laryngitis Laryngitis is inflammation of your vocal cords. This causes hoarseness, coughing, loss of voice, sore throat, or a dry throat. Your vocal cords are two bands of muscles that are found in your throat. When you speak, these cords come together and vibrate. These vibrations come out through  your mouth as sound. When your vocal cords are inflamed, your voice sounds different. Laryngitis can be temporary (acute) or long-term (chronic). Most cases of acute laryngitis improve with time. Chronic laryngitis is laryngitis that lasts for more than three weeks. CAUSES Acute laryngitis may be caused by:  A viral infection.  Lots of talking, yelling, or singing. This is also called vocal strain.  Bacterial infections. Chronic laryngitis may be caused by:  Vocal strain.  Injury to your vocal cords.  Acid reflux (gastroesophageal reflux disease or GERD).  Allergies.  Sinus infection.  Smoking.  Alcohol abuse.  Breathing in chemicals or dust.  Growths on the vocal cords. RISK FACTORS Risk factors for laryngitis include:  Smoking.  Alcohol abuse.  Having allergies. SIGNS AND SYMPTOMS Symptoms of laryngitis may include:  Low, hoarse voice.  Loss of voice.  Dry cough.  Sore throat.  Stuffy nose. DIAGNOSIS Laryngitis may be diagnosed by:  Physical exam.  Throat culture.  Blood test.  Laryngoscopy. This procedure allows your health care provider to look at your vocal cords with a mirror or viewing tube. TREATMENT Treatment for laryngitis depends on what is causing it. Usually, treatment involves resting your voice and using medicines to soothe your throat. However, if your laryngitis is caused by a bacterial infection, you may need to take antibiotic medicine. If your laryngitis is caused by a growth, you may need to have a procedure to remove it. HOME CARE INSTRUCTIONS  Drink enough fluid to keep your urine clear or pale yellow.  Breathe in moist  air. Use a humidifier if you live in a dry climate.  Take medicines only as directed by your health care provider.  If you were prescribed an antibiotic medicine, finish it all even if you start to feel better.  Do not smoke cigarettes or electronic cigarettes. If you need help quitting, ask your health care  provider.  Talk as little as possible. Also avoid whispering, which can cause vocal strain.  Write instead of talking. Do this until your voice is back to normal. SEEK MEDICAL CARE IF:  You have a fever.  You have increasing pain.  You have difficulty swallowing. SEEK IMMEDIATE MEDICAL CARE IF:  You cough up blood.  You have trouble breathing.   This information is not intended to replace advice given to you by your health care provider. Make sure you discuss any questions you have with your health care provider.   Document Released: 01/08/2005 Document Revised: 01/29/2014 Document Reviewed: 06/23/2013 Elsevier Interactive Patient Education 2016 Elsevier Inc. Otitis Media, Adult Otitis media is redness, soreness, and inflammation of the middle ear. Otitis media may be caused by allergies or, most commonly, by infection. Often it occurs as a complication of the common cold. SIGNS AND SYMPTOMS Symptoms of otitis media may include:  Earache.  Fever.  Ringing in your ear.  Headache.  Leakage of fluid from the ear. DIAGNOSIS To diagnose otitis media, your health care provider will examine your ear with an otoscope. This is an instrument that allows your health care provider to see into your ear in order to examine your eardrum. Your health care provider also will ask you questions about your symptoms. TREATMENT  Typically, otitis media resolves on its own within 3-5 days. Your health care provider may prescribe medicine to ease your symptoms of pain. If otitis media does not resolve within 5 days or is recurrent, your health care provider may prescribe antibiotic medicines if he or she suspects that a bacterial infection is the cause. HOME CARE INSTRUCTIONS   If you were prescribed an antibiotic medicine, finish it all even if you start to feel better.  Take medicines only as directed by your health care provider.  Keep all follow-up visits as directed by your health care  provider. SEEK MEDICAL CARE IF:  You have otitis media only in one ear, or bleeding from your nose, or both.  You notice a lump on your neck.  You are not getting better in 3-5 days.  You feel worse instead of better. SEEK IMMEDIATE MEDICAL CARE IF:   You have pain that is not controlled with medicine.  You have swelling, redness, or pain around your ear or stiffness in your neck.  You notice that part of your face is paralyzed.  You notice that the bone behind your ear (mastoid) is tender when you touch it. MAKE SURE YOU:   Understand these instructions.  Will watch your condition.  Will get help right away if you are not doing well or get worse.   This information is not intended to replace advice given to you by your health care provider. Make sure you discuss any questions you have with your health care provider.   Document Released: 10/14/2003 Document Revised: 01/29/2014 Document Reviewed: 08/05/2012 Elsevier Interactive Patient Education 2016 Elsevier Inc.   Monilial Vaginitis Vaginitis in a soreness, swelling and redness (inflammation) of the vagina and vulva. Monilial vaginitis is not a sexually transmitted infection. CAUSES  Yeast vaginitis is caused by yeast (candida) that is normally  found in your vagina. With a yeast infection, the candida has overgrown in number to a point that upsets the chemical balance. SYMPTOMS   White, thick vaginal discharge.  Swelling, itching, redness and irritation of the vagina and possibly the lips of the vagina (vulva).  Burning or painful urination.  Painful intercourse. DIAGNOSIS  Things that may contribute to monilial vaginitis are:  Postmenopausal and virginal states.  Pregnancy.  Infections.  Being tired, sick or stressed, especially if you had monilial vaginitis in the past.  Diabetes. Good control will help lower the chance.  Birth control pills.  Tight fitting garments.  Using bubble bath, feminine  sprays, douches or deodorant tampons.  Taking certain medications that kill germs (antibiotics).  Sporadic recurrence can occur if you become ill. TREATMENT  Your caregiver will give you medication.  There are several kinds of anti monilial vaginal creams and suppositories specific for monilial vaginitis. For recurrent yeast infections, use a suppository or cream in the vagina 2 times a week, or as directed.  Anti-monilial or steroid cream for the itching or irritation of the vulva may also be used. Get your caregiver's permission.  Painting the vagina with methylene blue solution may help if the monilial cream does not work.  Eating yogurt may help prevent monilial vaginitis. HOME CARE INSTRUCTIONS   Finish all medication as prescribed.  Do not have sex until treatment is completed or after your caregiver tells you it is okay.  Take warm sitz baths.  Do not douche.  Do not use tampons, especially scented ones.  Wear cotton underwear.  Avoid tight pants and panty hose.  Tell your sexual partner that you have a yeast infection. They should go to their caregiver if they have symptoms such as mild rash or itching.  Your sexual partner should be treated as well if your infection is difficult to eliminate.  Practice safer sex. Use condoms.  Some vaginal medications cause latex condoms to fail. Vaginal medications that harm condoms are:  Cleocin cream.  Butoconazole (Femstat).  Terconazole (Terazol) vaginal suppository.  Miconazole (Monistat) (may be purchased over the counter). SEEK MEDICAL CARE IF:   You have a temperature by mouth above 102 F (38.9 C).  The infection is getting worse after 2 days of treatment.  The infection is not getting better after 3 days of treatment.  You develop blisters in or around your vagina.  You develop vaginal bleeding, and it is not your menstrual period.  You have pain when you urinate.  You develop intestinal  problems.  You have pain with sexual intercourse.   This information is not intended to replace advice given to you by your health care provider. Make sure you discuss any questions you have with your health care provider.   Document Released: 10/18/2004 Document Revised: 04/02/2011 Document Reviewed: 07/12/2014 Elsevier Interactive Patient Education [employer].

## 2015-06-20 LAB — CULTURE, GROUP A STREP: Organism ID, Bacteria: NORMAL

## 2015-07-03 ENCOUNTER — Emergency Department (HOSPITAL_COMMUNITY)
Admission: EM | Admit: 2015-07-03 | Discharge: 2015-07-03 | Disposition: A | Discharge status: Home / Self Care | Payer: BLUE CROSS/BLUE SHIELD | Attending: Emergency Medicine | Admitting: Emergency Medicine

## 2015-07-03 ENCOUNTER — Encounter (HOSPITAL_COMMUNITY): Payer: Self-pay | Admitting: Emergency Medicine

## 2015-07-03 DIAGNOSIS — H9191 Unspecified hearing loss, right ear: Secondary | ICD-10-CM

## 2015-07-03 DIAGNOSIS — H6691 Otitis media, unspecified, right ear: Secondary | ICD-10-CM | POA: Diagnosis not present

## 2015-07-03 MED ORDER — KETOROLAC TROMETHAMINE 60 MG/2ML IM SOLN
60.0000 mg | Freq: Once | INTRAMUSCULAR | Status: AC
  Administered 2015-07-03: 60 mg via INTRAMUSCULAR
  Filled 2015-07-03: qty 2

## 2015-07-03 MED ORDER — AMOXICILLIN-POT CLAVULANATE 875-125 MG PO TABS
1.0000 | ORAL_TABLET | Freq: Once | ORAL | Status: AC
  Administered 2015-07-03: 1 via ORAL
  Filled 2015-07-03: qty 1

## 2015-07-03 MED ORDER — TRAMADOL HCL 50 MG PO TABS: 50.0000 mg | ORAL_TABLET | Freq: Two times a day (BID) | ORAL | Status: DC | PRN

## 2015-07-03 MED ORDER — TRAMADOL HCL 50 MG PO TABS
50.0000 mg | ORAL_TABLET | Freq: Once | ORAL | Status: AC
  Administered 2015-07-03: 50 mg via ORAL
  Filled 2015-07-03: qty 1

## 2015-07-03 MED ORDER — AMOXICILLIN-POT CLAVULANATE 875-125 MG PO TABS: 1.0000 | ORAL_TABLET | Freq: Two times a day (BID) | ORAL | Status: DC

## 2015-07-03 NOTE — ED Notes (Signed)
Pt states that about two weeks ago, she suddenly lost her hearing in her right her and partially in her left ear. Pt states her doctor diagnosed her with an inner ear infection and gave her a rx for amoxicillin and prednisone. Pt states they have not been helping. Pt states her neck also hurts. Pt denies having congestion or a cold recently

## 2015-07-03 NOTE — Discharge Instructions (Signed)
Hearing Loss Tracy Meyer, it is very important for you to see ENT within 3 days for evaluation of your ear.  Take antibiotics as directed for treatment.  Take tramadol as needed for any severe pain.  If any symptoms worsen, come back to the ED immediately. Thank you. Hearing loss is a partial or total loss of the ability to hear. This can be temporary or permanent, and it can happen in one or both ears. Hearing loss may be referred to as deafness. Medical care is necessary to treat hearing loss properly and to prevent the condition from getting worse. Your hearing may partially or completely come back, depending on what caused your hearing loss and how severe it is. In some cases, hearing loss is permanent. CAUSES Common causes of hearing loss include:   Too much wax in the ear canal.   Infection of the ear canal or middle ear.   Fluid in the middle ear.   Injury to the ear or surrounding area.   An object stuck in the ear.   Prolonged exposure to loud sounds, such as music.  Less common causes of hearing loss include:   Tumors in the ear.   Viral or bacterial infections, such as meningitis.   A hole in the eardrum (perforated eardrum).  Problems with the hearing nerve that sends signals between the brain and the ear.  Certain medicines.  SYMPTOMS  Symptoms of this condition may include:  Difficulty telling the difference between sounds.  Difficulty following a conversation when there is background noise.  Lack of response to sounds in your environment. This may be most noticeable when you do not respond to startling sounds.  Needing to turn up the volume on the television, radio, etc.  Ringing in the ears.  Dizziness.  Pain in the ears. DIAGNOSIS This condition is diagnosed based on a physical exam and a hearing test (audiometry). The audiometry test will be performed by a hearing specialist (audiologist). You may also be referred to an ear, nose, and throat  (ENT) specialist (otolaryngologist).  TREATMENT Treatment for recent onset of hearing loss may include:   Ear wax removal.   Being prescribed medicines to prevent infection (antibiotics).   Being prescribed medicines to reduce inflammation (corticosteroids).  HOME CARE INSTRUCTIONS  If you were prescribed an antibiotic medicine, take it as told by your health care provider. Do not stop taking the antibiotic even if you start to feel better.  Take over-the-counter and prescription medicines only as told by your health care provider.  Avoid loud noises.   Return to your normal activities as told by your health care provider. Ask your health care provider what activities are safe for you.  Keep all follow-up visits as told by your health care provider. This is important. SEEK MEDICAL CARE IF:   You feel dizzy.   You develop new symptoms.   You vomit or feel nauseous.   You have a fever.  SEEK IMMEDIATE MEDICAL CARE IF:  You develop sudden changes in your vision.   You have severe ear pain.   You have new or increased weakness.  You have a severe headache.   This information is not intended to replace advice given to you by your health care provider. Make sure you discuss any questions you have with your health care provider.   Document Released: 01/08/2005 Document Revised: 09/29/2014 Document Reviewed: 05/26/2014 Elsevier Interactive Patient Education [employer].

## 2015-07-03 NOTE — ED Provider Notes (Signed)
CSN: [id number]     Arrival date & time 07/03/15  0023 History   First MD Initiated Contact with Patient 07/03/15 0414     Chief Complaint  Patient presents with  . Hearing Problem     (Consider location/radiation/quality/duration/timing/severity/associated sxs/prior Treatment) HPI  Tracy Meyer is a 50 y.o. female with no significant past medical history presenting today with hearing loss. Patient states this began about 2 weeks ago. She states she has difficulty hearing out of both of her ear is worse in the right. She was given amoxicillin and prednisone but they have not helped. Patient also states that she has pain radiating down the right side of her neck. She denies all other symptoms. She states she has no congestion, rhinorrhea, sore throat, cough or fever.  She has tried Tyleno, Mucinex l and ibuprofen at home without any relief. There are no further complaints.  10 Systems reviewed and are negative for acute change except as noted in the HPI.       History reviewed. No pertinent past medical history. Past Surgical History  Procedure Laterality Date  . Breast biopsy  50 years old    cyst removal  . Cholecystectomy     Family History  Problem Relation Age of Onset  . Colon cancer Maternal Grandmother   . Brain cancer Maternal Grandmother    Social History  Substance Use Topics  . Smoking status: Never Smoker   . Smokeless tobacco: Never Used  . Alcohol Use: No   OB History    No data available     Review of Systems    Allergies  Shellfish allergy  Home Medications   Prior to Admission medications   Medication Sig Start Date End Date Taking? Authorizing Provider  fluconazole (DIFLUCAN) 150 MG tablet Take 1 tablet (150 mg total) by mouth once. Then repeat in 1 week. Patient not taking: Reported on 07/03/2015 06/18/15   [name], MD  magic mouthwash w/lidocaine SOLN Take 5 mLs by mouth 4 (four) times daily as needed for mouth pain. Patient not  taking: Reported on 07/03/2015 06/18/15   [name], MD   BP 109/69 mmHg  Pulse 77  Temp(Src) 98 F (36.7 C) (Oral)  Resp 20  SpO2 100%  LMP 03/17/2013 Physical Exam  Constitutional: She is oriented to person, place, and time. She appears well-developed and well-nourished. No distress.  HENT:  Head: Normocephalic and atraumatic.  Nose: Nose normal.  Mouth/Throat: Oropharynx is clear and moist. No oropharyngeal exudate.  Left ear canal is clear with a normal tympanic membrane. Right ear canal is also clear but there is fluid behind the tympanic membrane.  Eyes: Conjunctivae and EOM are normal. Pupils are equal, round, and reactive to light. No scleral icterus.  Neck: Normal range of motion. Neck supple. No JVD present. No tracheal deviation present. No thyromegaly present.  Cardiovascular: Normal rate, regular rhythm and normal heart sounds.  Exam reveals no gallop and no friction rub.   No murmur heard. Pulmonary/Chest: Effort normal and breath sounds normal. No respiratory distress. She has no wheezes. She exhibits no tenderness.  Abdominal: Soft. Bowel sounds are normal. She exhibits no distension and no mass. There is no tenderness. There is no rebound and no guarding.  Musculoskeletal: Normal range of motion. She exhibits no edema or tenderness.  Lymphadenopathy:    She has no cervical adenopathy.  Neurological: She is alert and oriented to person, place, and time. No cranial nerve deficit. She exhibits normal  muscle tone.  Skin: Skin is warm and dry. No rash noted. No erythema. No pallor.  Nursing note and vitals reviewed.   ED Course  Procedures (including critical care time) Labs Review Labs Reviewed - No data to display  Imaging Review No results found. I have personally reviewed and evaluated these images and lab results as part of my medical decision-making.   EKG Interpretation None      MDM   Final diagnoses:  None    Patient presents emergency  department for hearing loss. Right ear appears to have a mild infection. Amoxicillin did not help, will change antibiotic to Augmentin. She was given Toradol and tramadol in emergency department. We'll discharge home which overall to take as needed. ENT follow-up provided. Patient continues to be frustrated with the hearing loss. She is again advised to see ENT, she appears well and in no acute distress, vital signs were within her normal limits and she is safe for discharge.    [name], MD 07/03/15 [phone]

## 2015-07-21 ENCOUNTER — Ambulatory Visit (INDEPENDENT_AMBULATORY_CARE_PROVIDER_SITE_OTHER): Payer: BLUE CROSS/BLUE SHIELD | Admitting: Podiatry

## 2015-07-21 ENCOUNTER — Encounter: Payer: Self-pay | Admitting: Podiatry

## 2015-07-21 DIAGNOSIS — M79673 Pain in unspecified foot: Secondary | ICD-10-CM | POA: Diagnosis not present

## 2015-07-21 DIAGNOSIS — M779 Enthesopathy, unspecified: Secondary | ICD-10-CM

## 2015-07-21 NOTE — Progress Notes (Signed)
Subjective:     Patient ID: Tracy Meyer, female   DOB: 1965/07/28, 50 y.o.   MRN: [id number]  HPI patient states my feet are feeling quite a bit better and for the first time I'm really able to walk without pain   Review of Systems     Objective:   Physical Exam Neurovascular status intact muscle strength adequate diminished discomfort in the arch bilateral posterior heel is improving secondary to having previous shockwave therapy    Assessment:     Doing well post shockwave and lifting of the arch is bilateral    Plan:     I discussed continued fascial brace usage anti-inflammatories supportive shoes and if symptoms persist we'll need to consider a different type of orthotic. Reappoint to recheck

## 2015-09-24 ENCOUNTER — Ambulatory Visit (INDEPENDENT_AMBULATORY_CARE_PROVIDER_SITE_OTHER): Payer: BLUE CROSS/BLUE SHIELD | Admitting: Family Medicine

## 2015-09-24 VITALS — BP 116/76 | HR 94 | Temp 98.5°F | Resp 16 | Ht 66.5 in | Wt 267.0 lb

## 2015-09-24 DIAGNOSIS — M545 Low back pain, unspecified: Secondary | ICD-10-CM

## 2015-09-24 DIAGNOSIS — G8929 Other chronic pain: Secondary | ICD-10-CM

## 2015-09-24 DIAGNOSIS — Z8739 Personal history of other diseases of the musculoskeletal system and connective tissue: Secondary | ICD-10-CM | POA: Diagnosis not present

## 2015-09-24 DIAGNOSIS — R739 Hyperglycemia, unspecified: Secondary | ICD-10-CM

## 2015-09-24 DIAGNOSIS — M5441 Lumbago with sciatica, right side: Secondary | ICD-10-CM

## 2015-09-24 LAB — GLUCOSE, POCT (MANUAL RESULT ENTRY): POC GLUCOSE: 137 mg/dL — AB (ref 70–99)

## 2015-09-24 MED ORDER — CYCLOBENZAPRINE HCL 5 MG PO TABS: ORAL_TABLET | ORAL | 0 refills | Status: DC

## 2015-09-24 MED ORDER — PREDNISONE 20 MG PO TABS: ORAL_TABLET | ORAL | 0 refills | Status: DC

## 2015-09-24 NOTE — Patient Instructions (Addendum)
Your current back pain is likely a flare of the previous disc issues. As the anti-inflammatories over-the-counter are not helping, prednisone may provide more benefit. You can additionally take the muscle relaxant I prescribed today, and only if needed for breakthrough pain, take the tramadol. Avoid NSAIDs such as Advil or Aleve while you're taking prednisone. If the pain is not improving with prednisone or any worsening - return for possible xray or other testing at that time. Additionally I referred you to a back specialist that may be up to help with other management options to keep this from occurring in the future and for treatment options for your persistent low back pain.  Return to the clinic or go to the nearest emergency room if any of your symptoms worsen or new symptoms occur.   Sciatica Sciatica is pain, weakness, numbness, or tingling along the path of the sciatic nerve. The nerve starts in the lower back and runs down the back of each leg. The nerve controls the muscles in the lower leg and in the back of the knee, while also providing sensation to the back of the thigh, lower leg, and the sole of your foot. Sciatica is a symptom of another medical condition. For instance, nerve damage or certain conditions, such as a herniated disk or bone spur on the spine, pinch or put pressure on the sciatic nerve. This causes the pain, weakness, or other sensations normally associated with sciatica. Generally, sciatica only affects one side of the body. CAUSES   Herniated or slipped disc.  Degenerative disk disease.  A pain disorder involving the narrow muscle in the buttocks (piriformis syndrome).  Pelvic injury or fracture.  Pregnancy.  Tumor (rare). SYMPTOMS  Symptoms can vary from mild to very severe. The symptoms usually travel from the low back to the buttocks and down the back of the leg. Symptoms can include:  Mild tingling or dull aches in the lower back, leg, or hip.  Numbness  in the back of the calf or sole of the foot.  Burning sensations in the lower back, leg, or hip.  Sharp pains in the lower back, leg, or hip.  Leg weakness.  Severe back pain inhibiting movement. These symptoms may get worse with coughing, sneezing, laughing, or prolonged sitting or standing. Also, being overweight may worsen symptoms. DIAGNOSIS  Your caregiver will perform a physical exam to look for common symptoms of sciatica. He or she may ask you to do certain movements or activities that would trigger sciatic nerve pain. Other tests may be performed to find the cause of the sciatica. These may include:  Blood tests.  X-rays.  Imaging tests, such as an MRI or CT scan. TREATMENT  Treatment is directed at the cause of the sciatic pain. Sometimes, treatment is not necessary and the pain and discomfort goes away on its own. If treatment is needed, your caregiver may suggest:  Over-the-counter medicines to relieve pain.  Prescription medicines, such as anti-inflammatory medicine, muscle relaxants, or narcotics.  Applying heat or ice to the painful area.  Steroid injections to lessen pain, irritation, and inflammation around the nerve.  Reducing activity during periods of pain.  Exercising and stretching to strengthen your abdomen and improve flexibility of your spine. Your caregiver may suggest losing weight if the extra weight makes the back pain worse.  Physical therapy.  Surgery to eliminate what is pressing or pinching the nerve, such as a bone spur or part of a herniated disk. HOME CARE INSTRUCTIONS  Only take over-the-counter or prescription medicines for pain or discomfort as directed by your caregiver.  Apply ice to the affected area for 20 minutes, 3-4 times a day for the first 48-72 hours. Then try heat in the same way.  Exercise, stretch, or perform your usual activities if these do not aggravate your pain.  Attend physical therapy sessions as directed by your  caregiver.  Keep all follow-up appointments as directed by your caregiver.  Do not wear high heels or shoes that do not provide proper support.  Check your mattress to see if it is too soft. A firm mattress may lessen your pain and discomfort. SEEK IMMEDIATE MEDICAL CARE IF:   You lose control of your bowel or bladder (incontinence).  You have increasing weakness in the lower back, pelvis, buttocks, or legs.  You have redness or swelling of your back.  You have a burning sensation when you urinate.  You have pain that gets worse when you lie down or awakens you at night.  Your pain is worse than you have experienced in the past.  Your pain is lasting longer than 4 weeks.  You are suddenly losing weight without reason. MAKE SURE YOU:  Understand these instructions.  Will watch your condition.  Will get help right away if you are not doing well or get worse.   This information is not intended to replace advice given to you by your health care provider. Make sure you discuss any questions you have with your health care provider.   Document Released: 01/02/2001 Document Revised: 09/29/2014 Document Reviewed: 05/20/2011 Elsevier Interactive Patient Education 2016 [employer].     IF you received an x-ray today, you will receive an invoice from [facility]. Please contact [facility] at [phone] with questions or concerns regarding your invoice.   IF you received labwork today, you will receive an invoice from [employer]. Please contact [facility] at [phone] with questions or concerns regarding your invoice.   Our billing staff will not be able to assist you with questions regarding bills from these companies.  You will be contacted with the lab results as soon as they are available. The fastest way to get your results is to activate your My Chart account. Instructions are located on the last page of this paperwork. If you  have not heard from [place] regarding the results in 2 weeks, please contact this office.

## 2015-09-24 NOTE — Progress Notes (Signed)
By signing my name below I, [name], attest that this documentation has been prepared under the direction and in the presence of Tracy Agreste, MD. Electonically Signed. [name], Scribe 09/24/2015 at 9:40 AM  Subjective:    Patient ID: Tracy Meyer, female    DOB: 1965-08-19, 50 y.o.   MRN: [id number]  Chief Complaint  Patient presents with   Back Pain    Pt states the pain has been ongoing but worsened Tuesday     HPI [name] is a 50 y.o. female who presents to the [facility] complaining of back pain for the past 4 days. Pt states she has chronic back pain that severely worsened 4 days ago. Pain started to radiate down to her rt foot 4 days ago. Pt denies any fall or trauma. Pt denies any saddle anesthesia, fever, bowel or urinary incontinence. Pt has history of L5 bulging disc. Pt has been taking aleve and tramadol with no relief. Pt had a spinal epidural to control pain a few years ago with mild relief. Pt reports left foot numbness.   Most recent MRI Nov  2014, showed multifactorial spinal stenosis at L5-S1 region with left L5 nerve root impingement. MRI also shows L3/L4 protrusion on the rt with L3 nerve root impingement.  Pt denies history of DM, HTN, or HLD.    Note that upon reviewing pt's past medical charts pt had a blood sugar of 146 during an ED visit on 04/18/15. Pt reports her sugar was normal during her physical this year and prior blood sugars have all been within normal limits.    Patient Active Problem List   Diagnosis Date Noted   Acute respiratory failure ([place]) 04/18/2015   Chronic cholecystitis with calculus 08/06/2011   No past medical history on file. Past Surgical History:  Procedure Laterality Date   BREAST BIOPSY  50 years old   cyst removal   CHOLECYSTECTOMY     Allergies  Allergen Reactions   Shellfish Allergy Anaphylaxis   Prior to Admission medications   Medication Sig Start Date End Date Taking?  Authorizing Provider  traMADol (ULTRAM) 50 MG tablet Take 1 tablet (50 mg total) by mouth every 12 (twelve) hours as needed (severe pain). 07/03/15  Yes [name], MD   Social History   Social History   Marital status: Single    Spouse name: N/A   Number of children: N/A   Years of education: N/A   Occupational History   Not on file.   Social History Main Topics   Smoking status: Never Smoker   Smokeless tobacco: Never Used   Alcohol use No   Drug use: No   Sexual activity: No   Other Topics Concern   Not on file   Social History Narrative   No narrative on file      Review of Systems  Constitutional: Negative for fever.  Genitourinary: Negative for urgency.  Musculoskeletal: Positive for back pain (lumbar, radiates down rt leg).  Neurological: Positive for numbness (left foot).       Objective:   Physical Exam  Constitutional: She is oriented to person, place, and time. She appears well-developed and well-nourished. No distress.  HENT:  Head: Normocephalic and atraumatic.  Eyes: Conjunctivae are normal. Pupils are equal, round, and reactive to light.  Neck: Neck supple.  Cardiovascular: Normal rate.   Pulmonary/Chest: Effort normal.  Musculoskeletal: Normal range of motion.  Tender along lumbar spine into sciatic notch. Able to  tip toe stance. Pain with rt heel walk. Flexion to 90 degrees with pain. Equal lateral flexion and rotation of spine.   Neurological: She is alert and oriented to person, place, and time. She displays no Babinski's sign on the right side. She displays no Babinski's sign on the left side.  Reflex Scores:      Patellar reflexes are 2+ on the right side and 2+ on the left side.      Achilles reflexes are 2+ on the right side and 2+ on the left side. Skin: Skin is warm and dry.  Psychiatric: She has a normal mood and affect. Her behavior is normal.  Nursing note and vitals reviewed.    Vitals:   09/24/15 0911  BP: 116/76    Pulse: 94  Resp: 16  Temp: 98.5 F (36.9 C)  TempSrc: Oral  SpO2: 96%  Weight: 267 lb (121.1 kg)  Height: 5' 6.5" (1.689 m)   Results for orders placed or performed in visit on 09/24/15  POCT glucose (manual entry)  Result Value Ref Range   POC Glucose 137 (A) 70 - 99 mg/dl         Assessment & Plan:    [name] is a 50 y.o. female Right-sided low back pain with right-sided sciatica - Plan: cyclobenzaprine (FLEXERIL) 5 MG tablet, predniSONE (DELTASONE) 20 MG tablet, AMB referral to orthopedics, CANCELED: DG Lumbar Spine Complete  Chronic low back pain - Plan: AMB referral to orthopedics, CANCELED: DG Lumbar Spine Complete  Hyperglycemia - Plan: POCT glucose (manual entry)  History of herniated intervertebral disc - Plan: cyclobenzaprine (FLEXERIL) 5 MG tablet, predniSONE (DELTASONE) 20 MG tablet, AMB referral to orthopedics  Hx of chronic low back pain, With flare symptoms past 5 days, suspected flare of previous herniated disc. No known injury, no red flags on exam or history, imaging was deferred today.   - try prednisone taper as she has not had success with over-the-counter NSAIDs or tramadol. Side effects were discussed.   -Flexeril every 8 hours when necessary for muscle spasm, side effects discussed.  -With the chronicity of her back pain, and still with persistent symptoms with physical therapy, will refer her to orthopedics to decide on other treatment versus repeat imaging. Suspect she may be a candidate for epidural spinal injection as she has not had that done in sometime. RTC precautions given if not improving with prednisone or any worsening of symptoms.  Mildly elevated glucose in office today, but she is not fasting. Can follow-up with primary care provider for fasting glucose.  Meds ordered this encounter  Medications   cyclobenzaprine (FLEXERIL) 5 MG tablet    Sig: 1 pill by mouth up to every 8 hours as needed. Start with one pill by mouth each  bedtime as needed due to sedation    Dispense:  30 tablet    Refill:  0   predniSONE (DELTASONE) 20 MG tablet    Sig: 3 by mouth for 3 days, then 2 by mouth for 2 days, then 1 by mouth for 2 days, then 1/2 by mouth for 2 days.    Dispense:  16 tablet    Refill:  0   Patient Instructions   Your current back pain is likely a flare of the previous disc issues. As the anti-inflammatories over-the-counter are not helping, prednisone may provide more benefit. You can additionally take the muscle relaxant I prescribed today, and only if needed for breakthrough pain, take the tramadol. Avoid NSAIDs such as  Advil or Aleve while you're taking prednisone. If the pain is not improving with prednisone or any worsening - return for possible xray or other testing at that time. Additionally I referred you to a back specialist that may be up to help with other management options to keep this from occurring in the future and for treatment options for your persistent low back pain.  Return to the clinic or go to the nearest emergency room if any of your symptoms worsen or new symptoms occur.   Sciatica Sciatica is pain, weakness, numbness, or tingling along the path of the sciatic nerve. The nerve starts in the lower back and runs down the back of each leg. The nerve controls the muscles in the lower leg and in the back of the knee, while also providing sensation to the back of the thigh, lower leg, and the sole of your foot. Sciatica is a symptom of another medical condition. For instance, nerve damage or certain conditions, such as a herniated disk or bone spur on the spine, pinch or put pressure on the sciatic nerve. This causes the pain, weakness, or other sensations normally associated with sciatica. Generally, sciatica only affects one side of the body. CAUSES   Herniated or slipped disc.  Degenerative disk disease.  A pain disorder involving the narrow muscle in the buttocks (piriformis  syndrome).  Pelvic injury or fracture.  Pregnancy.  Tumor (rare). SYMPTOMS  Symptoms can vary from mild to very severe. The symptoms usually travel from the low back to the buttocks and down the back of the leg. Symptoms can include:  Mild tingling or dull aches in the lower back, leg, or hip.  Numbness in the back of the calf or sole of the foot.  Burning sensations in the lower back, leg, or hip.  Sharp pains in the lower back, leg, or hip.  Leg weakness.  Severe back pain inhibiting movement. These symptoms may get worse with coughing, sneezing, laughing, or prolonged sitting or standing. Also, being overweight may worsen symptoms. DIAGNOSIS  Your caregiver will perform a physical exam to look for common symptoms of sciatica. He or she may ask you to do certain movements or activities that would trigger sciatic nerve pain. Other tests may be performed to find the cause of the sciatica. These may include:  Blood tests.  X-rays.  Imaging tests, such as an MRI or CT scan. TREATMENT  Treatment is directed at the cause of the sciatic pain. Sometimes, treatment is not necessary and the pain and discomfort goes away on its own. If treatment is needed, your caregiver may suggest:  Over-the-counter medicines to relieve pain.  Prescription medicines, such as anti-inflammatory medicine, muscle relaxants, or narcotics.  Applying heat or ice to the painful area.  Steroid injections to lessen pain, irritation, and inflammation around the nerve.  Reducing activity during periods of pain.  Exercising and stretching to strengthen your abdomen and improve flexibility of your spine. Your caregiver may suggest losing weight if the extra weight makes the back pain worse.  Physical therapy.  Surgery to eliminate what is pressing or pinching the nerve, such as a bone spur or part of a herniated disk. HOME CARE INSTRUCTIONS   Only take over-the-counter or prescription medicines for pain  or discomfort as directed by your caregiver.  Apply ice to the affected area for 20 minutes, 3-4 times a day for the first 48-72 hours. Then try heat in the same way.  Exercise, stretch, or perform your  usual activities if these do not aggravate your pain.  Attend physical therapy sessions as directed by your caregiver.  Keep all follow-up appointments as directed by your caregiver.  Do not wear high heels or shoes that do not provide proper support.  Check your mattress to see if it is too soft. A firm mattress may lessen your pain and discomfort. SEEK IMMEDIATE MEDICAL CARE IF:   You lose control of your bowel or bladder (incontinence).  You have increasing weakness in the lower back, pelvis, buttocks, or legs.  You have redness or swelling of your back.  You have a burning sensation when you urinate.  You have pain that gets worse when you lie down or awakens you at night.  Your pain is worse than you have experienced in the past.  Your pain is lasting longer than 4 weeks.  You are suddenly losing weight without reason. MAKE SURE YOU:  Understand these instructions.  Will watch your condition.  Will get help right away if you are not doing well or get worse.   This information is not intended to replace advice given to you by your health care provider. Make sure you discuss any questions you have with your health care provider.   Document Released: 01/02/2001 Document Revised: 09/29/2014 Document Reviewed: 05/20/2011 Elsevier Interactive Patient Education 2016 [employer].     IF you received an x-ray today, you will receive an invoice from [facility]. Please contact [facility] at [phone] with questions or concerns regarding your invoice.   IF you received labwork today, you will receive an invoice from [employer]. Please contact [facility] at [phone] with questions or concerns regarding your invoice.   Our  billing staff will not be able to assist you with questions regarding bills from these companies.  You will be contacted with the lab results as soon as they are available. The fastest way to get your results is to activate your My Chart account. Instructions are located on the last page of this paperwork. If you have not heard from [place] regarding the results in 2 weeks, please contact this office.        I personally performed the services described in this documentation, which was scribed in my presence. The recorded information has been reviewed and considered, and addended by me as needed.   Signed,   Merri Ray, MD [facility].  09/24/15 9:56 AM

## 2015-10-01 ENCOUNTER — Telehealth: Payer: Self-pay | Admitting: *Deleted

## 2015-10-01 ENCOUNTER — Encounter (HOSPITAL_COMMUNITY): Payer: Self-pay

## 2015-10-01 ENCOUNTER — Ambulatory Visit (INDEPENDENT_AMBULATORY_CARE_PROVIDER_SITE_OTHER): Payer: BLUE CROSS/BLUE SHIELD

## 2015-10-01 ENCOUNTER — Ambulatory Visit (HOSPITAL_COMMUNITY)
Admission: RE | Admit: 2015-10-01 | Discharge: 2015-10-01 | Disposition: A | Discharge status: Home / Self Care | Payer: BLUE CROSS/BLUE SHIELD | Source: Ambulatory Visit | Attending: Family Medicine | Admitting: Family Medicine

## 2015-10-01 ENCOUNTER — Ambulatory Visit (INDEPENDENT_AMBULATORY_CARE_PROVIDER_SITE_OTHER): Payer: BLUE CROSS/BLUE SHIELD | Admitting: Family Medicine

## 2015-10-01 VITALS — BP 128/80 | HR 109 | Temp 99.1°F | Resp 18

## 2015-10-01 DIAGNOSIS — R1010 Upper abdominal pain, unspecified: Secondary | ICD-10-CM

## 2015-10-01 DIAGNOSIS — Z9049 Acquired absence of other specified parts of digestive tract: Secondary | ICD-10-CM | POA: Diagnosis not present

## 2015-10-01 DIAGNOSIS — N281 Cyst of kidney, acquired: Secondary | ICD-10-CM | POA: Insufficient documentation

## 2015-10-01 DIAGNOSIS — D259 Leiomyoma of uterus, unspecified: Secondary | ICD-10-CM | POA: Diagnosis not present

## 2015-10-01 DIAGNOSIS — D250 Submucous leiomyoma of uterus: Secondary | ICD-10-CM | POA: Diagnosis not present

## 2015-10-01 DIAGNOSIS — R197 Diarrhea, unspecified: Secondary | ICD-10-CM | POA: Diagnosis not present

## 2015-10-01 LAB — POCT CBC
Granulocyte percent: 70.1 %G (ref 37–80)
HCT, POC: 38 % (ref 37.7–47.9)
HEMOGLOBIN: 13.2 g/dL (ref 12.2–16.2)
Lymph, poc: 2.1 (ref 0.6–3.4)
MCH: 31.1 pg (ref 27–31.2)
MCHC: 34.8 g/dL (ref 31.8–35.4)
MCV: 89.5 fL (ref 80–97)
MID (cbc): 0.8 (ref 0–0.9)
MPV: 7 fL (ref 0–99.8)
PLATELET COUNT, POC: 267 10*3/uL (ref 142–424)
POC Granulocyte: 6.7 (ref 2–6.9)
POC LYMPH PERCENT: 21.7 %L (ref 10–50)
POC MID %: 8.2 %M (ref 0–12)
RBC: 4.25 M/uL (ref 4.04–5.48)
RDW, POC: 14.5 %
WBC: 9.5 10*3/uL (ref 4.6–10.2)

## 2015-10-01 LAB — COMPLETE METABOLIC PANEL WITH GFR
ALT: 21 U/L (ref 6–29)
AST: 15 U/L (ref 10–35)
Albumin: 3.4 g/dL — ABNORMAL LOW (ref 3.6–5.1)
Alkaline Phosphatase: 77 U/L (ref 33–115)
BILIRUBIN TOTAL: 0.9 mg/dL (ref 0.2–1.2)
BUN: 8 mg/dL (ref 7–25)
CHLORIDE: 107 mmol/L (ref 98–110)
CO2: 28 mmol/L (ref 20–31)
CREATININE: 1.02 mg/dL (ref 0.50–1.10)
Calcium: 8.5 mg/dL — ABNORMAL LOW (ref 8.6–10.2)
GFR, EST AFRICAN AMERICAN: 75 mL/min (ref >=60)
GFR, EST NON AFRICAN AMERICAN: 65 mL/min (ref >=60)
Glucose, Bld: 109 mg/dL — ABNORMAL HIGH (ref 65–99)
Potassium: 3.8 mmol/L (ref 3.5–5.3)
SODIUM: 142 mmol/L (ref 135–146)
Total Protein: 5.7 g/dL — ABNORMAL LOW (ref 6.1–8.1)

## 2015-10-01 LAB — LIPASE: LIPASE: 10 U/L (ref 7–60)

## 2015-10-01 LAB — AMYLASE: AMYLASE: 38 U/L (ref 0–105)

## 2015-10-01 MED ORDER — IOPAMIDOL (ISOVUE-300) INJECTION 61%
100.0000 mL | Freq: Once | INTRAVENOUS | Status: AC | PRN
  Administered 2015-10-01: 100 mL via INTRAVENOUS

## 2015-10-01 MED ORDER — FAMOTIDINE 40 MG/5ML PO SUSR: 40.0000 mg | Freq: Two times a day (BID) | ORAL | 0 refills | Status: DC | PRN

## 2015-10-01 MED ORDER — DIPHENOXYLATE-ATROPINE 2.5-0.025 MG PO TABS: 1.0000 | ORAL_TABLET | Freq: Four times a day (QID) | ORAL | 0 refills | Status: DC | PRN

## 2015-10-01 NOTE — Patient Instructions (Addendum)
Will follow-up with results of CT scan and lab results.  If fever increases or pain worsens follow-up during our business hours or go the [facility] emergency department.  Tracy Meyer. Kenton Kingfisher, MSN, FNP-C [facility]   IF you received an x-ray today, you will receive an invoice from [facility]. Please contact [facility] at [phone] with questions or concerns regarding your invoice.   IF you received labwork today, you will receive an invoice from [employer]. Please contact [facility] at [phone] with questions or concerns regarding your invoice.   Our billing staff will not be able to assist you with questions regarding bills from these companies.  You will be contacted with the lab results as soon as they are available. The fastest way to get your results is to activate your My Chart account. Instructions are located on the last page of this paperwork. If you have not heard from [place] regarding the results in 2 weeks, please contact this office.     Go now to [facility], register in the Emergency room for your scan, tell them you are there  For a CT scan only You will drink contrast #1 now, Contrast #2 at 1:20.

## 2015-10-01 NOTE — Telephone Encounter (Signed)
LOMOTIL CALLED IN [facility]

## 2015-10-01 NOTE — Progress Notes (Signed)
Patient ID: Tracy Meyer, female    DOB: [birth year], 50 y.o.   MRN: [id number]  PCP: Andria Frames, MD  Chief Complaint  Patient presents with  . Abdominal Pain    x4 days, described as a stomach cramping.     Subjective:   HPI 50 year old female presents for evaluation of diarrhea times 3 days.  She was seen in the office on 09/24/15 and treated for acute low back pain with a prednisone taper.  Diarrhea started three days ago and didn't complete the remaining three doses of prednisone. Denies blood or watery diarrhea. Reports a measurable temperature of 100 last night. She reports that her upper abdomin began to feel pain described as "cramping" yesterday and has worsen on today.  She had her gallbladder removed 2013 and describes the pain as similar. She hasn't had an appetite and reports food makes pain worst.  She reports being at the hospital ED with a friend on Tuesday and reports that being the only exposure of being around any ill.   . Social History   Social History  . Marital status: Single    Spouse name: N/A  . Number of children: N/A  . Years of education: N/A   Occupational History  . Not on file.   Social History Main Topics  . Smoking status: Never Smoker  . Smokeless tobacco: Never Used  . Alcohol use No  . Drug use: No  . Sexual activity: No   Other Topics Concern  . Not on file   Social History Narrative  . No narrative on file   Review of Systems  Gastrointestinal:       See history of present illness    Patient Active Problem List   Diagnosis Date Noted  . Acute respiratory failure ([place]) 04/18/2015  . Chronic cholecystitis with calculus 08/06/2011     Prior to Admission medications   Medication Sig Start Date End Date Taking? Authorizing Provider  cyclobenzaprine (FLEXERIL) 5 MG tablet 1 pill by mouth up to every 8 hours as needed. Start with one pill by mouth each bedtime as needed due to sedation Patient not taking: Reported on  10/01/2015 09/24/15   [name], MD  predniSONE (DELTASONE) 20 MG tablet 3 by mouth for 3 days, then 2 by mouth for 2 days, then 1 by mouth for 2 days, then 1/2 by mouth for 2 days. Patient not taking: Reported on 10/01/2015 09/24/15   [name], MD  traMADol (ULTRAM) 50 MG tablet Take 1 tablet (50 mg total) by mouth every 12 (twelve) hours as needed (severe pain). Patient not taking: Reported on 10/01/2015 07/03/15   [name], MD     Allergies  Allergen Reactions  . Shellfish Allergy Anaphylaxis       Objective:  Physical Exam  Constitutional: She is oriented to person, place, and time. She appears well-developed and well-nourished.  HENT:  Head: Normocephalic and atraumatic.  Right Ear: External ear normal.  Left Ear: External ear normal.  Mouth/Throat: Oropharynx is clear and moist.  Eyes: Conjunctivae and EOM are normal. Pupils are equal, round, and reactive to light.  Neck: Normal range of motion. Neck supple.  Cardiovascular: Regular rhythm, normal heart sounds and intact distal pulses.   Pulmonary/Chest: Effort normal and breath sounds normal.  Abdominal: Soft. Bowel sounds are normal. She exhibits no distension and no mass. There is tenderness. There is rebound. There is no guarding.  Pain in right and lower upper quadrants  with light and deep palpation at the lateral side of the umbilicus  No mass or enlargement noted Rebound tenderness noted right and left of umbilicus.   Musculoskeletal: Normal range of motion.  Neurological: She is alert and oriented to person, place, and time.  Skin: Skin is warm and dry.  Psychiatric: She has a normal mood and affect. Her behavior is normal. Judgment and thought content normal.    .Ct Abdomen Pelvis W Contrast  Result Date: 10/01/2015 CLINICAL DATA:  Diarrhea for 3 days. EXAM: CT ABDOMEN AND PELVIS WITH CONTRAST TECHNIQUE: Multidetector CT imaging of the abdomen and pelvis was performed using the standard protocol following  bolus administration of intravenous contrast. CONTRAST:  123mL ISOVUE-300 IOPAMIDOL (ISOVUE-300) INJECTION 61% COMPARISON:  07/09/2011 FINDINGS: Lower chest: No acute abnormality. Hepatobiliary: No focal liver abnormality is seen. Status post cholecystectomy. No biliary dilatation. Pancreas: Unremarkable. No pancreatic ductal dilatation or surrounding inflammatory changes. Spleen: The spleen appears normal. Adrenals/Urinary Tract: Normal adrenal glands. Cyst within the inferior pole the right kidney measures 3.6 cm, image 37 of series 2. Within the upper pole the left kidney there is a cyst which measures 1 cm. No mass or hydronephrosis identified. Stomach/Bowel: The stomach is within normal limits. The small bowel loops have a normal course and caliber. No obstruction. Normal appearance of the colon. The appendix is visualized and appears normal, image 51 of series 2. No abnormal bowel wall thickening or inflammation identified. Vascular/Lymphatic: Normal appearance of the abdominal aorta. No enlarged retroperitoneal or mesenteric adenopathy. No enlarged pelvic or inguinal lymph nodes. Reproductive: Uterine fibroids identified. The largest appears subserosal measuring 5.6 cm, image 76 of series 2. 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 IMPRESSION: 1. No acute findings within the abdomen or pelvis. No explanation for patient's diarrhea. 2. Previous cholecystectomy. 3. Uterine fibroids 4. Kidney cysts Electronically Signed   By: Kerby Moors M.D.   On: 10/01/2015 15:44   Dg Abd 2 Views  Result Date: 10/01/2015 CLINICAL DATA:  Bilateral abdominal pain for the past 3 days with diarrhea. EXAM: ABDOMEN - 2 VIEW COMPARISON:  None. FINDINGS: Large colonic stool burden without evidence of enteric obstruction. No pneumoperitoneum, pneumatosis or portal venous gas. No definitive abnormal intra-abdominal calcifications given overlying  colonic stool burden. Post cholecystectomy. Limited visualization of the lower thorax is negative for focal airspace opacity or pleural effusion. No acute osseus abnormalities. IMPRESSION: 1. Large colonic stool burden without evidence of enteric obstruction. 2. Post cholecystectomy. Electronically Signed   By: Sandi Mariscal M.D.   On: 10/01/2015 11:51    Vitals:   10/01/15 1105  BP: 128/80  Pulse: (!) 109  Resp: 18  Temp: 99.1 F (37.3 C)   Assessment & Plan:  1. Pain of upper abdomen - POCT CBC - COMPLETE METABOLIC PANEL WITH GFR - DG Abd 2 Views; Future - Amylase - Lipase  Patient presented With a complaint of significant abdominal pain. She appeared in significant distress with ambulation and with positional changes. Physical exam noted rebound tenderness bilaterally at umbilicus. Patient does report taking a tapered dose of prednisone but did not have any abdominal sensitivity with first 6 doses. As the patient is visually stress and reports rebound tenderness with exam a CT of the abdomen and pelvis is indicated to rule out acute appendicitis.  Plan:  STAT CT abdomin and pelvis -negative for anything acute. CT revealed the presence of uterine fibroids, which a referral to gynecology  has been ordered for further evaluation. Abdominal pain start Pepcid 40 mg/62ml twice daily as needed. If diarrhea continues, diphenoxylate-atropine 2.5-0.025 mg, 1 tab up to 4 times daily as needed for loose stool.  Follow-up as needed.  Carroll Sage. Kenton Kingfisher, MSN, FNP-C [facility]

## 2015-10-05 ENCOUNTER — Encounter: Payer: Self-pay | Admitting: Family Medicine

## 2015-10-05 NOTE — Progress Notes (Signed)
Dear Ms. Labine,  Below are the results from your recent visit were as expected.  Resulted Orders  COMPLETE METABOLIC PANEL WITH GFR  Result Value Ref Range   Sodium 142 135 - 146 mmol/L   Potassium 3.8 3.5 - 5.3 mmol/L   Chloride 107 98 - 110 mmol/L   CO2 28 20 - 31 mmol/L   Glucose, Bld 109 (H) 65 - 99 mg/dL   BUN 8 7 - 25 mg/dL   Creat 1.02 0.50 - 1.10 mg/dL   Total Bilirubin 0.9 0.2 - 1.2 mg/dL   Alkaline Phosphatase 77 33 - 115 U/L   AST 15 10 - 35 U/L   ALT 21 6 - 29 U/L   Total Protein 5.7 (L) 6.1 - 8.1 g/dL   Albumin 3.4 (L) 3.6 - 5.1 g/dL   Calcium 8.5 (L) 8.6 - 10.2 mg/dL   GFR, Est African American 75 >=60 mL/min   GFR, Est Non African American 65 >=60 mL/min   Narrative   Performed at:  [facility]                [address], [id number]                [place], [place] [address]  Amylase  Result Value Ref Range   Amylase 38 0 - 105 U/L   Narrative   Performed at:  [address], [id number]                [place], [place] [address]  Lipase  Result Value Ref Range   Lipase 10 7 - 60 U/L   Narrative   Performed at:  [address], [id number]                [place], [place] [address]     If you have any questions or concerns, please don't hesitate to call.  Sincerely,   Carroll Sage. Kenton Kingfisher, MSN, FNP-C [facility]

## 2015-10-13 ENCOUNTER — Other Ambulatory Visit: Payer: Self-pay | Admitting: Orthopedic Surgery

## 2015-10-13 DIAGNOSIS — M5416 Radiculopathy, lumbar region: Secondary | ICD-10-CM

## 2015-10-16 ENCOUNTER — Encounter (HOSPITAL_COMMUNITY): Payer: Self-pay | Admitting: Emergency Medicine

## 2015-10-16 ENCOUNTER — Ambulatory Visit (HOSPITAL_COMMUNITY)
Admission: EM | Admit: 2015-10-16 | Discharge: 2015-10-16 | Disposition: A | Discharge status: Home / Self Care | Payer: BLUE CROSS/BLUE SHIELD | Attending: Internal Medicine | Admitting: Internal Medicine

## 2015-10-16 DIAGNOSIS — J069 Acute upper respiratory infection, unspecified: Secondary | ICD-10-CM

## 2015-10-16 MED ORDER — TRIAMCINOLONE ACETONIDE 55 MCG/ACT NA AERO: 2.0000 | INHALATION_SPRAY | Freq: Every day | NASAL | 0 refills | Status: DC

## 2015-10-16 MED ORDER — BENZONATATE 200 MG PO CAPS: 200.0000 mg | ORAL_CAPSULE | Freq: Three times a day (TID) | ORAL | 1 refills | Status: DC | PRN

## 2015-10-16 NOTE — ED Provider Notes (Signed)
[place]    CSN: [id number] Arrival date & time: 10/16/15  1208  First Provider Contact:  First MD Initiated Contact with Patient 10/16/15 1324        History   Chief Complaint Chief Complaint  Patient presents with  . URI    HPI Tracy Meyer is a 50 y.o. female. Present today with runny nose, sinus congestion, postnasal drainage, pressure. Tactile temperature. Symptoms started yesterday. Some sore throat. No nausea/vomiting. Was taking prednisone for back pain, but stopped because of loose stools. Missed work yesterday.    HPI  History reviewed. No pertinent past medical history.  Patient Active Problem List   Diagnosis Date Noted  . Acute respiratory failure ([place]) 04/18/2015  . 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 BREAST BIOPSY  50 years old   cyst removal  . CHOLECYSTECTOMY       Home Medications    Prior to Admission medications   Medication Sig Start Date End Date Taking? Authorizing Provider  cyclobenzaprine (FLEXERIL) 5 MG tablet 1 pill by mouth up to every 8 hours as needed. Start with one pill by mouth each bedtime as needed due to sedation Patient not taking: Reported on 10/01/2015 09/24/15   [name], MD  diphenoxylate-atropine (LOMOTIL) 2.5-0.025 MG tablet Take 1 tablet by mouth 4 (four) times daily as needed for diarrhea or loose stools. 10/01/15   Sedalia Muta, FNP  famotidine (PEPCID) 40 MG/5ML suspension Take 5 mLs (40 mg total) by mouth 2 (two) times daily as needed for heartburn or indigestion. 10/01/15   Sedalia Muta, FNP  predniSONE (DELTASONE) 20 MG tablet 3 by mouth for 3 days, then 2 by mouth for 2 days, then 1 by mouth for 2 days, then 1/2 by mouth for 2 days. Patient not taking: Reported on 10/01/2015 09/24/15   [name], MD  traMADol (ULTRAM) 50 MG tablet Take 1 tablet (50 mg total) by mouth every 12 (twelve) hours as needed  (severe pain). Patient not taking: Reported on 10/01/2015 07/03/15   [name], MD    Family History Family History  Problem Relation Age of Onset  . Colon cancer Maternal Grandmother   . Brain cancer Maternal Grandmother     Social History Social History  Substance Use Topics  . Smoking status: Never Smoker  . Smokeless tobacco: Never Used  . Alcohol use No     Allergies   Shellfish allergy   Review of Systems Review of Systems  All other systems reviewed and are negative.    Physical Exam Triage Vital Signs ED Triage Vitals  Enc Vitals Group     BP 10/16/15 1243 131/89     Pulse Rate 10/16/15 1243 102     Resp 10/16/15 1243 16     Temp 10/16/15 1243 99.8 F (37.7 C)     Temp Source 10/16/15 1243 Oral     SpO2 10/16/15 1243 95 %     Weight 10/16/15 1243 262 lb (118.8 kg)     Height 10/16/15 1243 5\' 6"  (1.676 m)     Pain Score 10/16/15 1245 8   Updated Vital Signs BP 131/89   Pulse 102   Temp 99.8 F (37.7 C) (Oral)   Resp 16   Ht 5\' 6"  (1.676 m)   Wt 262 lb (118.8 kg)   LMP 03/17/2013   SpO2 95%   BMI 42.29 kg/m  Physical Exam  Constitutional: She is oriented to person, place, and time. No distress.  Alert, nicely groomed Voice sounds very congested Looks ill but not toxic, huddled under blankets on the exam table. Able to sit up for exam  HENT:  Head: Atraumatic.  Bilateral TMs are dull, left is flushed somewhat pink, right is red tinged Severe nasal congestion/occlusion bilaterally with clear rhinorrhea Throat is slightly red with prominent tonsils, no exudate  Eyes:  Conjugate gaze, no eye redness/drainage  Neck: Neck supple.  Cardiovascular: Normal rate and regular rhythm.   Pulmonary/Chest: No respiratory distress.  Lungs clear, symmetric breath sounds  Abdominal: She exhibits no distension.  Musculoskeletal: Normal range of motion.  No leg swelling  Neurological: She is alert and oriented to person, place, and time.  Skin: Skin is  warm and dry.  No cyanosis  Nursing note and vitals reviewed.    UC Treatments / Results   Procedures Procedures (including critical care time)   Final Clinical Impressions(s) / UC Diagnoses   Final diagnoses:  URI (upper respiratory infection)   Recheck or followup with primary care provider Kristie Cowman for persistent fever >100.5, increasing phlegm production, or if cough/congestion not starting to improve in a few days.  Prescriptions for nasacort (for congestion) and benzonatate (for cough) were sent to the [place] [facility] and [facility].    New Prescriptions Discharge Medication List as of 10/16/2015  1:41 PM    START taking these medications   Details  benzonatate (TESSALON) 200 MG capsule Take 1 capsule (200 mg total) by mouth 3 (three) times daily as needed for cough., Starting Sun 10/16/2015, Normal    triamcinolone (NASACORT AQ) 55 MCG/ACT AERO nasal inhaler Place 2 sprays into the nose daily., Starting Sun 10/16/2015, Normal         Sherlene Shams, MD 10/18/15 1535

## 2015-10-16 NOTE — Discharge Instructions (Addendum)
Recheck or followup with primary care provider Kristie Cowman for persistent fever >100.5, increasing phlegm production, or if cough/congestion not starting to improve in a few days.  Prescriptions for nasacort (for congestion) and benzonatate (for cough) were sent to the [place] at [facility] and [facility].

## 2015-10-16 NOTE — ED Triage Notes (Signed)
PT reports cold symptoms that started yesterday. PT reports she also has body aches. PT reports long term back pain as well.

## 2015-10-27 ENCOUNTER — Encounter (HOSPITAL_COMMUNITY): Payer: Self-pay | Admitting: Emergency Medicine

## 2015-10-27 ENCOUNTER — Emergency Department (HOSPITAL_COMMUNITY): Payer: BLUE CROSS/BLUE SHIELD

## 2015-10-27 ENCOUNTER — Emergency Department (HOSPITAL_COMMUNITY)
Admission: EM | Admit: 2015-10-27 | Discharge: 2015-10-27 | Disposition: A | Discharge status: Home / Self Care | Payer: BLUE CROSS/BLUE SHIELD | Attending: Emergency Medicine | Admitting: Emergency Medicine

## 2015-10-27 DIAGNOSIS — M549 Dorsalgia, unspecified: Secondary | ICD-10-CM

## 2015-10-27 DIAGNOSIS — M5441 Lumbago with sciatica, right side: Secondary | ICD-10-CM | POA: Diagnosis not present

## 2015-10-27 DIAGNOSIS — M545 Low back pain: Secondary | ICD-10-CM | POA: Diagnosis present

## 2015-10-27 DIAGNOSIS — Z79899 Other long term (current) drug therapy: Secondary | ICD-10-CM | POA: Insufficient documentation

## 2015-10-27 DIAGNOSIS — M5431 Sciatica, right side: Secondary | ICD-10-CM

## 2015-10-27 LAB — COMPREHENSIVE METABOLIC PANEL
ALT: 15 U/L (ref 14–54)
ANION GAP: 6 (ref 5–15)
AST: 19 U/L (ref 15–41)
Albumin: 3.4 g/dL — ABNORMAL LOW (ref 3.5–5.0)
Alkaline Phosphatase: 55 U/L (ref 38–126)
BUN: 8 mg/dL (ref 6–20)
CALCIUM: 8.4 mg/dL — AB (ref 8.9–10.3)
CO2: 28 mmol/L (ref 22–32)
Chloride: 109 mmol/L (ref 101–111)
Creatinine, Ser: 0.87 mg/dL (ref 0.44–1.00)
GFR calc Af Amer: >60 mL/min (ref >60)
GFR calc non Af Amer: >60 mL/min (ref >60)
GLUCOSE: 100 mg/dL — AB (ref 65–99)
Potassium: 3.9 mmol/L (ref 3.5–5.1)
SODIUM: 143 mmol/L (ref 135–145)
TOTAL PROTEIN: 6.1 g/dL — AB (ref 6.5–8.1)
Total Bilirubin: 0.4 mg/dL (ref 0.3–1.2)

## 2015-10-27 LAB — URINALYSIS, ROUTINE W REFLEX MICROSCOPIC
Bilirubin Urine: NEGATIVE
GLUCOSE, UA: NEGATIVE mg/dL
HGB URINE DIPSTICK: NEGATIVE
KETONES UR: NEGATIVE mg/dL
Leukocytes, UA: NEGATIVE
Nitrite: NEGATIVE
PROTEIN: NEGATIVE mg/dL
Specific Gravity, Urine: 1.015 (ref 1.005–1.030)
pH: 6.5 (ref 5.0–8.0)

## 2015-10-27 LAB — CBC WITH DIFFERENTIAL/PLATELET
BASOS ABS: 0 10*3/uL (ref 0.0–0.1)
Basophils Relative: 0 %
EOS ABS: 0.3 10*3/uL (ref 0.0–0.7)
EOS PCT: 5 %
HCT: 38.9 % (ref 36.0–46.0)
Hemoglobin: 12.4 g/dL (ref 12.0–15.0)
Lymphocytes Relative: 45 %
Lymphs Abs: 2.6 10*3/uL (ref 0.7–4.0)
MCH: 30.1 pg (ref 26.0–34.0)
MCHC: 31.9 g/dL (ref 30.0–36.0)
MCV: 94.4 fL (ref 78.0–100.0)
Monocytes Absolute: 0.4 10*3/uL (ref 0.1–1.0)
Monocytes Relative: 7 %
Neutro Abs: 2.5 10*3/uL (ref 1.7–7.7)
Neutrophils Relative %: 43 %
PLATELETS: 311 10*3/uL (ref 150–400)
RBC: 4.12 MIL/uL (ref 3.87–5.11)
RDW: 13.5 % (ref 11.5–15.5)
WBC: 5.8 10*3/uL (ref 4.0–10.5)

## 2015-10-27 LAB — I-STAT BETA HCG BLOOD, ED (MC, WL, AP ONLY)

## 2015-10-27 MED ORDER — MORPHINE SULFATE (PF) 4 MG/ML IV SOLN
4.0000 mg | Freq: Once | INTRAVENOUS | Status: AC
  Administered 2015-10-27: 4 mg via INTRAVENOUS
  Filled 2015-10-27: qty 1

## 2015-10-27 MED ORDER — OXYCODONE-ACETAMINOPHEN 5-325 MG PO TABS: 1.0000 | ORAL_TABLET | ORAL | 0 refills | Status: DC | PRN

## 2015-10-27 MED ORDER — CYCLOBENZAPRINE HCL 10 MG PO TABS: 10.0000 mg | ORAL_TABLET | Freq: Two times a day (BID) | ORAL | 0 refills | Status: DC | PRN

## 2015-10-27 NOTE — ED Notes (Signed)
Patient transported to MRI 

## 2015-10-27 NOTE — ED Triage Notes (Signed)
Pt reports lower back pain radiating down R leg. Numbness in foot. Pt reports she has back problems and is working on getting them fixed. No known injury.

## 2015-10-27 NOTE — Discharge Instructions (Signed)
Please follow-up with your orthopedic spine team for further management of your low back pain. Please follow-up with PCP.   If symptoms worsen or you develop signs and symptoms of spinal cord compromise, please return to the nearest emergency department.

## 2015-10-27 NOTE — ED Notes (Signed)
Pt reminded of the need for a urine sample.  

## 2015-10-27 NOTE — ED Notes (Signed)
Pt returned to from MRI
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
## 2015-10-28 LAB — URINE CULTURE

## 2015-11-02 ENCOUNTER — Other Ambulatory Visit: Payer: BLUE CROSS/BLUE SHIELD

## 2015-12-01 ENCOUNTER — Encounter: Payer: Self-pay | Admitting: Podiatry

## 2015-12-01 ENCOUNTER — Ambulatory Visit (INDEPENDENT_AMBULATORY_CARE_PROVIDER_SITE_OTHER): Payer: BLUE CROSS/BLUE SHIELD | Admitting: Podiatry

## 2015-12-01 DIAGNOSIS — M7661 Achilles tendinitis, right leg: Secondary | ICD-10-CM

## 2015-12-01 DIAGNOSIS — M779 Enthesopathy, unspecified: Secondary | ICD-10-CM | POA: Diagnosis not present

## 2015-12-01 NOTE — Progress Notes (Signed)
Subjective:     Patient ID: Tracy Meyer, female   DOB: 04-01-1965, 50 y.o.   MRN: [id number]  HPI patient presents stating she's been getting pain in both her feet and she has a lot of burning tingling especially on the left and she does have significant back issues   Review of Systems     Objective:   Physical Exam Neurovascular status unchanged with patient having significant flatfoot deformity with tendinitis Achilles tendinitis and overall inflammatory conditions. Patient's also noted to have burning tingling mostly in the left foot with MRI indicating a narrowed canal in her posterior back lumbar region    Assessment:     Probable chronic pain secondary to back issues with tendinitis noted of an acute nature secondary to foot structure    Plan:     X-rays reviewed with patient and at this point I recommended a customized Berkley type orthotic to try to provide plantar support and she is scanned for a Berkley type orthotic which she will get as soon as possible and she is due to see her back physician for possible surgery and also is trying to get disability at this time

## 2015-12-21 ENCOUNTER — Ambulatory Visit: Payer: BLUE CROSS/BLUE SHIELD

## 2015-12-22 ENCOUNTER — Encounter: Payer: BLUE CROSS/BLUE SHIELD | Admitting: Podiatry

## 2016-02-28 ENCOUNTER — Encounter: Payer: Self-pay | Admitting: Vascular Surgery

## 2016-03-06 ENCOUNTER — Encounter: Payer: BLUE CROSS/BLUE SHIELD | Admitting: Vascular Surgery

## 2016-03-14 ENCOUNTER — Encounter (HOSPITAL_BASED_OUTPATIENT_CLINIC_OR_DEPARTMENT_OTHER): Payer: Self-pay | Admitting: Emergency Medicine

## 2016-03-14 ENCOUNTER — Emergency Department (HOSPITAL_BASED_OUTPATIENT_CLINIC_OR_DEPARTMENT_OTHER): Payer: BLUE CROSS/BLUE SHIELD

## 2016-03-14 ENCOUNTER — Emergency Department (HOSPITAL_BASED_OUTPATIENT_CLINIC_OR_DEPARTMENT_OTHER)
Admission: EM | Admit: 2016-03-14 | Discharge: 2016-03-14 | Disposition: A | Discharge status: Home / Self Care | Payer: BLUE CROSS/BLUE SHIELD | Attending: Emergency Medicine | Admitting: Emergency Medicine

## 2016-03-14 DIAGNOSIS — J40 Bronchitis, not specified as acute or chronic: Secondary | ICD-10-CM | POA: Insufficient documentation

## 2016-03-14 MED ORDER — ALBUTEROL SULFATE HFA 108 (90 BASE) MCG/ACT IN AERS
2.0000 | INHALATION_SPRAY | Freq: Once | RESPIRATORY_TRACT | Status: AC
  Administered 2016-03-14: 2 via RESPIRATORY_TRACT
  Filled 2016-03-14: qty 6.7

## 2016-03-14 NOTE — ED Triage Notes (Signed)
URI symptoms since last Wednesday.  No acute distress noted.

## 2016-03-14 NOTE — ED Provider Notes (Signed)
[facility] DEPT [facility] Provider Note   CSN: [id number] Arrival date & time: 03/14/16  1052     History   Chief Complaint Chief Complaint  Patient presents with  . URI    HPI Tracy Meyer is a 51 y.o. female.  The history is provided by the patient.  URI   This is a new problem. Episode onset: 1 week. The problem has not changed since onset.There has been no fever. Associated symptoms include congestion, plugged ear sensation, rhinorrhea, sore throat and cough (dry). Pertinent negatives include no chest pain, no abdominal pain, no nausea, no vomiting and no dysuria. She has tried other medications for the symptoms. The treatment provided mild relief.    No past medical history on file.  Patient Active Problem List   Diagnosis Date Noted  . Acute respiratory failure ([place]) 04/18/2015  . 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 BREAST BIOPSY  51 years old   cyst removal  . CHOLECYSTECTOMY      OB History    No data available       Home Medications    Prior to Admission medications   Medication Sig Start Date End Date Taking? Authorizing Provider  Aspirin-Acetaminophen-Caffeine (GOODY HEADACHE PO) Take 1-2 packets by mouth daily as needed (headaches).    Historical Provider, MD    Family History Family History  Problem Relation Age of Onset  . Colon cancer Maternal Grandmother   . Brain cancer Maternal Grandmother     Social History Social History  Substance Use Topics  . Smoking status: Never Smoker  . Smokeless tobacco: Never Used  . Alcohol use No     Allergies   Shellfish allergy   Review of Systems Review of Systems  HENT: Positive for congestion, rhinorrhea and sore throat.   Respiratory: Positive for cough (dry).   Cardiovascular: Negative for chest pain.  Gastrointestinal: Negative for abdominal pain, nausea and vomiting.  Genitourinary: Negative for dysuria.    Musculoskeletal: Positive for back pain (right sided back pain).  Ten systems are reviewed and are negative for acute change except as noted in the HPI    Physical Exam Updated Vital Signs BP (!) 123/101 (BP Location: Left Arm)   Pulse 76   Temp 99 F (37.2 C) (Oral)   Resp 20   Ht 5\' 6"  (1.676 m)   Wt 270 lb (122.5 kg)   LMP 04/15/2014   SpO2 95%   BMI 43.58 kg/m   Physical Exam  Constitutional: She is oriented to person, place, and time. She appears well-developed and well-nourished. No distress.  HENT:  Head: Normocephalic and atraumatic.  Right Ear: Tympanic membrane is not injected, not erythematous and not bulging. A middle ear effusion is present.  Left Ear: Tympanic membrane is not injected, not erythematous and not bulging. A middle ear effusion is present.  Nose: Mucosal edema present.  Eyes: Conjunctivae and EOM are normal. Pupils are equal, round, and reactive to light. Right eye exhibits no discharge. Left eye exhibits no discharge. No scleral icterus.  Neck: Normal range of motion. Neck supple.  Cardiovascular: Normal rate and regular rhythm.  Exam reveals no gallop and no friction rub.   No murmur heard. Pulmonary/Chest: Effort normal. No stridor. No respiratory distress. She has wheezes (diffuse). She has no rales.     She exhibits tenderness.  Abdominal: Soft. She exhibits no distension. There is no tenderness.  Musculoskeletal: She exhibits no edema  or tenderness.  Neurological: She is alert and oriented to person, place, and time.  Skin: Skin is warm and dry. No rash noted. She is not diaphoretic. No erythema.  Psychiatric: She has a normal mood and affect.  Vitals reviewed.    ED Treatments / Results  Labs (all labs ordered are listed, but only abnormal results are displayed) Labs Reviewed - No data to display  EKG  EKG Interpretation None       Radiology Dg Chest 2 View  Result Date: 03/14/2016 CLINICAL DATA:  Shortness of breath.  EXAM: CHEST  2 VIEW COMPARISON:  04/18/2015. FINDINGS: Mediastinum hilar structures normal. Low lung volumes. No focal infiltrate. No pleural effusion or pneumothorax. IMPRESSION: No acute cardiopulmonary disease. Electronically Signed   By: Marcello Moores  Register   On: 03/14/2016 12:22    Procedures Procedures (including critical care time)  Medications Ordered in ED Medications  albuterol (PROVENTIL HFA;VENTOLIN HFA) 108 (90 Base) MCG/ACT inhaler 2 puff (2 puffs Inhalation Given 03/14/16 1220)     Initial Impression / Assessment and Plan / ED Course  I have reviewed the triage vital signs and the nursing notes.  Pertinent labs & imaging results that were available during my care of the patient were reviewed by me and considered in my medical decision making (see chart for details).     Chest x-ray without evidence of pneumonia. Presentation most consistent with viral process causing upper and lower respiratory infection. She is well-appearing, well-hydrated, nontoxic and not hypoxic. Patient provided with albuterol puffs 2 for the wheezing which had complete resolution of her wheezing. Able to tolerate by mouth here. She'll be appropriate for discharge with strict return precautions and close PCP follow-up.  Final Clinical Impressions(s) / ED Diagnoses   Final diagnoses:  Bronchitis   Disposition: Discharge  Condition: Good  I have discussed the results, Dx and Tx plan with the patient who expressed understanding and agree(s) with the plan. Discharge instructions discussed at great length. The patient was given strict return precautions who verbalized understanding of the instructions. No further questions at time of discharge.    New Prescriptions   No medications on file    Follow Up: Kristie Cowman, MD [place] [address] [phone]  Schedule an appointment as soon as possible for a visit  in 5-7 days, If symptoms do not improve or  worsen      Fatima Blank, MD 03/14/16 1436

## 2016-11-23 ENCOUNTER — Encounter (HOSPITAL_BASED_OUTPATIENT_CLINIC_OR_DEPARTMENT_OTHER): Payer: Self-pay | Admitting: *Deleted

## 2016-11-23 ENCOUNTER — Emergency Department (HOSPITAL_BASED_OUTPATIENT_CLINIC_OR_DEPARTMENT_OTHER)
Admission: EM | Admit: 2016-11-23 | Discharge: 2016-11-23 | Disposition: A | Discharge status: Home / Self Care | Payer: Managed Care, Other (non HMO) | Attending: Emergency Medicine | Admitting: Emergency Medicine

## 2016-11-23 DIAGNOSIS — R21 Rash and other nonspecific skin eruption: Secondary | ICD-10-CM | POA: Diagnosis present

## 2016-11-23 DIAGNOSIS — L03011 Cellulitis of right finger: Secondary | ICD-10-CM | POA: Diagnosis not present

## 2016-11-23 MED ORDER — CEPHALEXIN 500 MG PO CAPS: 500.0000 mg | ORAL_CAPSULE | Freq: Two times a day (BID) | ORAL | 0 refills | Status: AC

## 2016-11-23 MED FILL — CEPHALEXIN 500 MG CAPSULE: 500 | 7 days supply | Qty: 14 | Fill #0

## 2016-11-23 NOTE — Discharge Instructions (Signed)
Please read and follow all provided instructions.  Your diagnoses today include:  1. Cellulitis of finger of right hand     Tests performed today include: Vital signs. See below for your results today.   Medications prescribed:  Take as prescribed   Home care instructions:  Follow any educational materials contained in this packet.  Follow-up instructions: Please follow-up with your primary care provider for further evaluation of symptoms and treatment   Return instructions:  Please return to the Emergency Department if you do not get better, if you get worse, or new symptoms OR  - Fever (temperature greater than 101.87F)  - Bleeding that does not stop with holding pressure to the area    -Severe pain (please note that you may be more sore the day after your accident)  - Chest Pain  - Difficulty breathing  - Severe nausea or vomiting  - Inability to tolerate food and liquids  - Passing out  - Skin becoming red around your wounds  - Change in mental status (confusion or lethargy)  - New numbness or weakness    Please return if you have any other emergent concerns.  Additional Information:  Your vital signs today were: BP (!) 141/75    Pulse 75    Temp 98.6 F (37 C) (Oral)    Resp 18    Ht 5' 6.75" (1.695 m)    Wt 122.5 kg (270 lb)    LMP 04/15/2014    SpO2 96%    BMI 42.61 kg/m  If your blood pressure (BP) was elevated above 135/85 this visit, please have this repeated by your doctor within one month. ---------------

## 2016-11-23 NOTE — ED Triage Notes (Signed)
Rash, itching and swelling for 2 days.

## 2016-11-23 NOTE — ED Provider Notes (Signed)
[facility] EMERGENCY DEPARTMENT Provider Note   CSN: [id number] Arrival date & time: 11/23/16  1135     History   Chief Complaint Chief Complaint  Patient presents with  . Rash    HPI Tracy Meyer is a 51 y.o. female.  HPI  51 y.o. female, presents to the Emergency Department today due to rash. Notes symptoms x 2 days. Notes redness on Wednesday on her right foot. Noted itching. Proceeded to scratch area and then noticed redness on right finger yesterday. Notes streaking of redness to right finger on hand. No swelling. No fevers. Denies pain to areas. Notes itching persists. Benadryl PRN with minimal relief. No sick contacts. No exposures to new foods, plants, medications. No risk factors for poor wound healing. No hx DM. No other symptoms noted.    History reviewed. No pertinent past medical history.  Patient Active Problem List   Diagnosis Date Noted  . Acute respiratory failure ([place]) 04/18/2015  . 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 BREAST BIOPSY  51 years old   cyst removal  . CHOLECYSTECTOMY      OB History    No data available       Home Medications    Prior to Admission medications   Medication Sig Start Date End Date Taking? Authorizing Provider  Aspirin-Acetaminophen-Caffeine (GOODY HEADACHE PO) Take 1-2 packets by mouth daily as needed (headaches).    [provider]    Family History Family History  Problem Relation Age of Onset  . Colon cancer Maternal Grandmother   . Brain cancer Maternal Grandmother     Social History Social History  Substance Use Topics  . Smoking status: Never Smoker  . Smokeless tobacco: Never Used  . Alcohol use No     Allergies   Shellfish allergy   Review of Systems Review of Systems ROS reviewed and all are negative for acute change except as noted in the HPI.  Physical Exam Updated Vital Signs BP (!) 141/75    Pulse 75   Temp 98.6 F (37 C) (Oral)   Resp 18   Ht 5' 6.75" (1.695 m)   Wt 122.5 kg (270 lb)   LMP 04/15/2014   SpO2 96%   BMI 42.61 kg/m   Physical Exam  Constitutional: She is oriented to person, place, and time. Vital signs are normal. She appears well-developed and well-nourished.  HENT:  Head: Normocephalic and atraumatic.  Right Ear: Hearing normal.  Left Ear: Hearing normal.  Eyes: Pupils are equal, round, and reactive to light. Conjunctivae and EOM are normal.  Neck: Normal range of motion. Neck supple.  Cardiovascular: Normal rate, regular rhythm, normal heart sounds and intact distal pulses.   Pulmonary/Chest: Effort normal.  Musculoskeletal: Normal range of motion.  Superficial cellulitis noted to right middle finger. Minimal streaking. No swelling. NVI. Cap refill <sec. Non purulent. There is a small wound. Superficial as well.    Neurological: She is alert and oriented to person, place, and time.  Skin: Skin is warm and dry.  Psychiatric: She has a normal mood and affect. Her speech is normal and behavior is normal. Thought content normal.  Nursing note and vitals reviewed.    ED Treatments / Results  Labs (all labs ordered are listed, but only abnormal results are displayed) Labs Reviewed - No data to display  EKG  EKG Interpretation None       Radiology No results found.  Procedures Procedures (including critical care time)  Medications Ordered in ED Medications - No data to display   Initial Impression / Assessment and Plan / ED Course  I have reviewed the triage vital signs and the nursing notes.  Pertinent labs & imaging results that were available during my care of the patient were reviewed by me and considered in my medical decision making (see chart for details).  Final Clinical Impressions(s) / ED Diagnoses     {I have reviewed the relevant previous healthcare records.  {I obtained HPI from historian.   ED Course:  Assessment: Pt  is a 51 y.o. female presents to the Emergency Department today due to rash. Notes symptoms x 2 days. Notes redness on Wednesday on her right foot. Noted itching. Proceeded to scratch area and then noticed redness on right finger yesterday. Notes streaking of redness to right finger on hand. No swelling. No fevers. Denies pain to areas. Notes itching persists. Benadryl PRN with minimal relief. No sick contacts. No exposures to new foods, plants, medications. On exam, pt in NAD. Nontoxic/nonseptic appearing. VSS. Afebrile. Lungs CTA. Heart RRR. Superficial cellulitis noted to right middle finger. Minimal streaking. No swelling. NVI. Cap refill <sec. Non purulent. There is a small wound. Superficial as well. Given ABX for cellulitis. Strict return precautions. Follow up in ED for recheck for resolution. Plan is to DC home. At time of discharge, Patient is in no acute distress. Vital Signs are stable. Patient is able to ambulate. Patient able to tolerate PO.   Disposition/Plan:  DC Home Additional Verbal discharge instructions given and discussed with patient.  Pt Instructed to f/u with PCP in the next week for evaluation and treatment of symptoms. Return precautions given Pt acknowledges and agrees with plan  Supervising Physician Davonna Belling, MD  Final diagnoses:  Cellulitis of finger of right hand    New Prescriptions New Prescriptions   No medications on file     Conni Slipper 11/23/16 [place], Nathan, MD 11/23/16 [id number]

## 2016-12-04 ENCOUNTER — Encounter (HOSPITAL_BASED_OUTPATIENT_CLINIC_OR_DEPARTMENT_OTHER): Payer: Self-pay | Admitting: *Deleted

## 2016-12-04 ENCOUNTER — Emergency Department (HOSPITAL_BASED_OUTPATIENT_CLINIC_OR_DEPARTMENT_OTHER)
Admission: EM | Admit: 2016-12-04 | Discharge: 2016-12-04 | Disposition: A | Discharge status: Home / Self Care | Payer: Managed Care, Other (non HMO) | Attending: Emergency Medicine | Admitting: Emergency Medicine

## 2016-12-04 ENCOUNTER — Other Ambulatory Visit: Payer: Self-pay

## 2016-12-04 DIAGNOSIS — L03818 Cellulitis of other sites: Secondary | ICD-10-CM | POA: Diagnosis not present

## 2016-12-04 DIAGNOSIS — L282 Other prurigo: Secondary | ICD-10-CM | POA: Insufficient documentation

## 2016-12-04 DIAGNOSIS — R21 Rash and other nonspecific skin eruption: Secondary | ICD-10-CM | POA: Diagnosis present

## 2016-12-04 MED ORDER — HYDROXYZINE HCL 25 MG PO TABS: 25.0000 mg | ORAL_TABLET | Freq: Four times a day (QID) | ORAL | 0 refills | Status: DC

## 2016-12-04 MED ORDER — HYDROXYZINE HCL 25 MG PO TABS
25.0000 mg | ORAL_TABLET | Freq: Once | ORAL | Status: AC
  Administered 2016-12-04: 25 mg via ORAL
  Filled 2016-12-04: qty 1

## 2016-12-04 MED ORDER — FAMOTIDINE 20 MG PO TABS: 20.0000 mg | ORAL_TABLET | Freq: Two times a day (BID) | ORAL | 0 refills | Status: DC

## 2016-12-04 MED ORDER — CLINDAMYCIN HCL 300 MG PO CAPS: 300.0000 mg | ORAL_CAPSULE | Freq: Four times a day (QID) | ORAL | 0 refills | Status: DC

## 2016-12-04 MED ORDER — FAMOTIDINE 20 MG PO TABS
20.0000 mg | ORAL_TABLET | Freq: Once | ORAL | Status: AC
  Administered 2016-12-04: 20 mg via ORAL
  Filled 2016-12-04: qty 1

## 2016-12-04 MED ORDER — PREDNISONE 20 MG PO TABS: ORAL_TABLET | ORAL | 0 refills | Status: DC

## 2016-12-04 NOTE — ED Provider Notes (Signed)
[facility] EMERGENCY DEPARTMENT Provider Note   CSN: [id number] Arrival date & time: 12/04/16  1804     History   Chief Complaint Chief Complaint  Patient presents with  . Rash    HPI Tracy Meyer is a 51 y.o. female.  Patient recently seen for treatment of rash and cellulitis. Completed treatment with keflex. Minimal improvement in cellulitis. Patient continues to have pruritic rash on arms and legs. Area of redness and increased warmth on right elbow and left upper arm.   The history is provided by the patient and medical records. No language interpreter was used.  Rash   This is a recurrent problem. The current episode started more than 1 week ago. The problem has been gradually worsening. The problem is associated with an unknown factor. There has been no fever. The rash is present on the right arm, left arm, left lower leg and right lower leg. The patient is experiencing no pain. Associated symptoms include blisters and itching.    History reviewed. No pertinent past medical history.  Patient Active Problem List   Diagnosis Date Noted  . Acute respiratory failure ([place]) 04/18/2015  . 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 BREAST BIOPSY  51 years old   cyst removal  . CHOLECYSTECTOMY      OB History    No data available       Home Medications    Prior to Admission medications   Medication Sig Start Date End Date Taking? Authorizing Provider  Aspirin-Acetaminophen-Caffeine (GOODY HEADACHE PO) Take 1-2 packets by mouth daily as needed (headaches).    [provider]    Family History Family History  Problem Relation Age of Onset  . Colon cancer Maternal Grandmother   . Brain cancer Maternal Grandmother     Social History Social History   Tobacco Use  . Smoking status: Never Smoker  . Smokeless tobacco: Never Used  Substance Use Topics  . Alcohol use: No  .  Drug use: No     Allergies   Shellfish allergy   Review of Systems Review of Systems  Skin: Positive for itching and rash.  All other systems reviewed and are negative.    Physical Exam Updated Vital Signs BP (!) 147/82 (BP Location: Left Arm)   Pulse 82   Temp 98.1 F (36.7 C) (Oral)   Resp 18   Ht 5\' 6"  (1.676 m)   Wt 122.5 kg (270 lb)   LMP 04/15/2014   SpO2 100%   BMI 43.58 kg/m   Physical Exam  Constitutional: She is oriented to person, place, and time. She appears well-developed and well-nourished.  HENT:  Head: Atraumatic.  Eyes: Conjunctivae are normal.  Neck: Neck supple.  Cardiovascular: Normal rate and regular rhythm.  Pulmonary/Chest: Effort normal and breath sounds normal.  Abdominal: Soft. Bowel sounds are normal.  Musculoskeletal: Normal range of motion.  Lymphadenopathy:    She has no cervical adenopathy.  Neurological: She is alert and oriented to person, place, and time.  Skin: Rash noted. There is erythema.  Psychiatric: She has a normal mood and affect.  Nursing note and vitals reviewed.    ED Treatments / Results  Labs (all labs ordered are listed, but only abnormal results are displayed) Labs Reviewed - No data to display  EKG  EKG Interpretation None       Radiology No results found.  Procedures Procedures (including critical care time)  Medications Ordered in ED Medications - No data to display   Initial Impression / Assessment and Plan / ED Course  I have reviewed the triage vital signs and the nursing notes.  Pertinent labs & imaging results that were available during my care of the patient were reviewed by me and considered in my medical decision making (see chart for details).     Patient presentation consistent with cellulitis. Afebrile. No tachycardia, hypotension or other symptoms suggestive of severe infection.Patient advised to follow up for wound check in 2-3 days, sooner for worsening systemic symptoms,  new lymphangitis, or significant spread of erythema. Will discharge with clindamycin. Return precautions discussed. Pt appears safe for discharge.   Final Clinical Impressions(s) / ED Diagnoses   Final diagnoses:  Pruritic rash  Cellulitis of other specified site    ED Discharge Orders        Ordered    clindamycin (CLEOCIN) 300 MG capsule  4 times daily     12/04/16 1915    famotidine (PEPCID) 20 MG tablet  2 times daily     12/04/16 1915    hydrOXYzine (ATARAX/VISTARIL) 25 MG tablet  Every 6 hours     12/04/16 1915    predniSONE (DELTASONE) 20 MG tablet     12/04/16 1915       Etta Quill, NP 12/05/16 1638    Tanna Furry, MD 12/09/16 2311

## 2016-12-04 NOTE — ED Triage Notes (Signed)
Pt c/o rash  X 3 d ays

## 2016-12-08 ENCOUNTER — Encounter: Payer: Self-pay | Admitting: Physician Assistant

## 2016-12-08 ENCOUNTER — Ambulatory Visit (INDEPENDENT_AMBULATORY_CARE_PROVIDER_SITE_OTHER): Payer: Managed Care, Other (non HMO) | Admitting: Physician Assistant

## 2016-12-08 ENCOUNTER — Other Ambulatory Visit: Payer: Self-pay

## 2016-12-08 VITALS — BP 120/86 | HR 75 | Temp 98.1°F | Resp 16 | Ht 66.0 in | Wt 278.8 lb

## 2016-12-08 DIAGNOSIS — R21 Rash and other nonspecific skin eruption: Secondary | ICD-10-CM

## 2016-12-08 MED ORDER — CLOBETASOL PROPIONATE 0.05 % EX CREA: 1.0000 "application " | TOPICAL_CREAM | Freq: Two times a day (BID) | CUTANEOUS | 0 refills | Status: DC

## 2016-12-08 NOTE — Progress Notes (Signed)
PRIMARY CARE AT [facility] [address], [place] [address] [phone]  Date:  12/08/2016   Name:  Fallynn Gravett   DOB:  [birth year]   MRN:  [id number]  PCP:  Patient, No Pcp Per    History of Present Illness:  Kamyrah Feeser is a 51 y.o. female patient who presents to PCP with  Chief Complaint  Patient presents with  . Rash    feet, legs, arms x 2 wks     2 weeks, hand pruritic, and red.  Felt warm to the touch of her right hand.  They are itching bumps that have been surfacing for the last 4 days.  They started with bliosters vesicular lesions.  These were on her foot and legs.   She was advised they were due to dogs (fleas).  No one else in the home complain of the rash.   No outdoor activity in the last several weeks.   She works from home.  She recalls consuming a new drink non-etOH.  She had no change in hygeine products She switched to aveeno and lotions for the last week.   It keeps her up with the itching.   No rash at the interwebbing of her digits or folds and flexor areas.  No UR symptoms, of throat pain, coughing, eetc.   Patient Active Problem List   Diagnosis Date Noted  . Acute respiratory failure ([place]) 04/18/2015  . Chronic cholecystitis with calculus 08/06/2011    No past medical history on file.  Past Surgical History:  Procedure Laterality Date  . BREAST BIOPSY  51 years old   cyst removal  . CHOLECYSTECTOMY    . LAPAROSCOPIC CHOLECYSTECTOMY SINGLE SITE N/A 08/28/2011   Performed by Adin Hector., MD at [facility] OR    Social History   Tobacco Use  . Smoking status: Never Smoker  . Smokeless tobacco: Never Used  Substance Use Topics  . Alcohol use: No  . Drug use: No    Family History  Problem Relation Age of Onset  . Colon cancer Maternal Grandmother   . Brain cancer Maternal Grandmother     Allergies  Allergen Reactions  . Shellfish Allergy Anaphylaxis    Medication list has been reviewed and updated.  Current Outpatient  Medications on File Prior to Visit  Medication Sig Dispense Refill  . clindamycin (CLEOCIN) 300 MG capsule Take 1 capsule (300 mg total) 4 (four) times daily by mouth. X 7 days 28 capsule 0  . famotidine (PEPCID) 20 MG tablet Take 1 tablet (20 mg total) 2 (two) times daily by mouth. 30 tablet 0  . hydrOXYzine (ATARAX/VISTARIL) 25 MG tablet Take 1 tablet (25 mg total) every 6 (six) hours by mouth. 12 tablet 0  . predniSONE (DELTASONE) 20 MG tablet 3 tabs po day one, then 2 tabs daily x 4 days 11 tablet 0  . Aspirin-Acetaminophen-Caffeine (GOODY HEADACHE PO) Take 1-2 packets by mouth daily as needed (headaches).     No current facility-administered medications on file prior to visit.     ROS ROS otherwise unremarkable unless listed above.  Physical Examination: BP 120/86   Pulse 75   Temp 98.1 F (36.7 C) (Oral)   Resp 16   Ht 5\' 6"  (1.676 m)   Wt 278 lb 12.8 oz (126.5 kg)   LMP 04/15/2014   SpO2 96%   BMI 45.00 kg/m  Ideal Body Weight: Weight in (lb) to have BMI = 25: 154.6  Physical Exam  Constitutional: She is  oriented to person, place, and time. She appears well-developed and well-nourished. No distress.  HENT:  Head: Normocephalic and atraumatic.  Right Ear: External ear normal.  Left Ear: External ear normal.  Eyes: Conjunctivae and EOM are normal. Pupils are equal, round, and reactive to light.  Cardiovascular: Normal rate.  Pulmonary/Chest: Effort normal. No respiratory distress.  Neurological: She is alert and oriented to person, place, and time.  Skin: She is not diaphoretic.  excoritation and dried scabs along the forearm of right arm.  Also present on both lower extremity of lower leg.  Non-tender.  No pustules or erythema present.  No vesicular lesions.  Flexor regions, and interwebbing are spared.    Psychiatric: She has a normal mood and affect. Her behavior is normal.     Assessment and Plan: Sharia Averitt is a 51 y.o. female who is here today for cc of   Chief Complaint  Patient presents with  . Rash    feet, legs, arms x 2 wks  we will add a steroid cream Advised to go ahead and take the prednisone.  She is relucntant due to side effect of weight gain.  She can try using the topical cream first, and if this is not helping, she should restart the initial regimen.  Precaution of topical cream is advised. This is possible flea vs bed bugs.  This may also be poison ivy contact dermatitis.  Rtc in 2 weeks if no improvement.  Rash and nonspecific skin eruption - Plan: clobetasol cream (TEMOVATE) 0.05 %  Ivar Drape, PA-C [facility] 11/24/20189:04 AM

## 2016-12-08 NOTE — Patient Instructions (Addendum)
Make sure all sheets and linens are washed in hot soap and water.   Take the prednisone in the morning with breakfast, if you continue this medication.   If the rash worsens within the next 4 days, please return.   Rash A rash is a change in the color of the skin. A rash can also change the way your skin feels. There are many different conditions and factors that can cause a rash. Follow these instructions at home: Pay attention to any changes in your symptoms. Follow these instructions to help with your condition: Medicine Take or apply over-the-counter and prescription medicines only as told by your doctor. These may include:  Corticosteroid cream.  Anti-itch lotions.  Oral antihistamines.  Skin Care  Put cool compresses on the affected areas.  Try taking a bath with: ? Epsom salts. Follow the instructions on the packaging. You can get these at your local pharmacy or grocery store. ? Baking soda. Pour a small amount into the bath as told by your doctor. ? Colloidal oatmeal. Follow the instructions on the packaging. You can get this at your local pharmacy or grocery store.  Try putting baking soda paste onto your skin. Stir water into baking soda until it gets like a paste.  Do not scratch or rub your skin.  Avoid covering the rash. Make sure the rash is exposed to air as much as possible. General instructions  Avoid hot showers or baths, which can make itching worse. A cold shower may help.  Avoid scented soaps, detergents, and perfumes. Use gentle soaps, detergents, perfumes, and other cosmetic products.  Avoid anything that causes your rash. Keep a journal to help track what causes your rash. Write down: ? What you eat. ? What cosmetic products you use. ? What you drink. ? What you wear. This includes jewelry.  Keep all follow-up visits as told by your doctor. This is important. Contact a doctor if:  You sweat at night.  You lose weight.  You pee (urinate) more  than normal.  You feel weak.  You throw up (vomit).  Your skin or the whites of your eyes look yellow (jaundice).  Your skin: ? Tingles. ? Is numb.  Your rash: ? Does not go away after a few days. ? Gets worse.  You are: ? More thirsty than normal. ? More tired than normal.  You have: ? New symptoms. ? Pain in your belly (abdomen). ? A fever. ? Watery poop (diarrhea). Get help right away if:  Your rash covers all or most of your body. The rash may or may not be painful.  You have blisters that: ? Are on top of the rash. ? Grow larger. ? Grow together. ? Are painful. ? Are inside your nose or mouth.  You have a rash that: ? Looks like purple pinprick-sized spots all over your body. ? Has a "bull's eye" or looks like a target. ? Is red and painful, causes your skin to peel, and is not from being in the sun too long. This information is not intended to replace advice given to you by your health care provider. Make sure you discuss any questions you have with your health care provider. Document Released: 06/27/2007 Document Revised: 06/16/2015 Document Reviewed: 05/26/2014 Elsevier Interactive Patient Education  2018 [employer].    IF you received an x-ray today, you will receive an invoice from [facility]. Please contact [facility] at [phone] with questions or concerns regarding your invoice.  IF you received labwork today, you will receive an invoice from [facility]. Please contact [facility] at [phone] with questions or concerns regarding your invoice.   Our billing staff will not be able to assist you with questions regarding bills from these companies.  You will be contacted with the lab results as soon as they are available. The fastest way to get your results is to activate your My Chart account. Instructions are located on the last page of this paperwork. If you have not heard from [place] regarding the results in 2 weeks, please  contact this office.

## 2017-05-01 ENCOUNTER — Encounter: Payer: Self-pay | Admitting: Physician Assistant

## 2017-09-20 DIAGNOSIS — M545 Low back pain, unspecified: Secondary | ICD-10-CM | POA: Insufficient documentation

## 2017-10-18 ENCOUNTER — Other Ambulatory Visit: Payer: Self-pay | Admitting: Orthopedic Surgery

## 2017-10-18 DIAGNOSIS — M545 Low back pain, unspecified: Secondary | ICD-10-CM

## 2017-10-26 ENCOUNTER — Other Ambulatory Visit: Payer: Managed Care, Other (non HMO)

## 2017-11-08 ENCOUNTER — Ambulatory Visit
Admission: RE | Admit: 2017-11-08 | Discharge: 2017-11-08 | Disposition: A | Discharge status: Home / Self Care | Payer: Managed Care, Other (non HMO) | Source: Ambulatory Visit | Attending: Orthopedic Surgery | Admitting: Orthopedic Surgery

## 2017-11-08 DIAGNOSIS — M545 Low back pain, unspecified: Secondary | ICD-10-CM

## 2018-02-26 ENCOUNTER — Other Ambulatory Visit: Payer: Self-pay | Admitting: *Deleted

## 2018-02-27 ENCOUNTER — Telehealth: Payer: Self-pay | Admitting: Vascular Surgery

## 2018-02-27 NOTE — Telephone Encounter (Signed)
sch appt lvm mld ltr 04/01/2018 1245pm f/u MD

## 2018-02-27 NOTE — Telephone Encounter (Signed)
-----   Message from Willy Eddy, RN sent at 02/26/2018 11:25 AM EST ----- Regarding: FW: Office appointment  ----- Message ----- From: Willy Eddy, RN Sent: 02/20/2018   9:47 AM EST To: [facility] Pool Subject: Office appointment                             Please schedule an office appointment with Dr. Donnetta Hutching. ALIF scheduled for 04/16/2018 with Dr. Lynann Bologna. Remind patient to bring all films, etc. Related to this surgery with him for this appointment. Thanks, [employer]

## 2018-04-01 ENCOUNTER — Other Ambulatory Visit: Payer: Self-pay

## 2018-04-01 ENCOUNTER — Ambulatory Visit (INDEPENDENT_AMBULATORY_CARE_PROVIDER_SITE_OTHER): Payer: Managed Care, Other (non HMO) | Admitting: Vascular Surgery

## 2018-04-01 ENCOUNTER — Encounter: Payer: Self-pay | Admitting: Vascular Surgery

## 2018-04-01 VITALS — BP 150/96 | HR 75 | Resp 18 | Ht 66.0 in | Wt 288.8 lb

## 2018-04-01 DIAGNOSIS — M5136 Other intervertebral disc degeneration, lumbar region: Secondary | ICD-10-CM | POA: Diagnosis not present

## 2018-04-01 NOTE — Progress Notes (Signed)
Vascular and Vein Specialist of [facility]  Patient name: Tracy Meyer MRN: [id number] DOB: [birth year] Sex: female  REASON FOR CONSULT: Discuss anterior exposure for interbody fusion  HPI: Tracy Meyer is a 53 y.o. female, who is here today for discussion of anterior exposure.  She has had years of back and lower extremity pain.  She reports this is now progressed to the point where she is unable to function her usual activities.  She is certainly failed conservative treatment as well.  Dr. Lynann Bologna has recommended fusion from an anterior approach and she is here today for discussion of my role in this.  She has had laparoscopic cholecystectomy but no other intra-abdominal surgery.  No history of cardiac disease and no history of peripheral vascular occlusive disease  History reviewed. No pertinent past medical history.  Family History  Problem Relation Age of Onset  . Colon cancer Maternal Grandmother   . Brain cancer Maternal Grandmother     SOCIAL HISTORY: Social History   Socioeconomic History  . Marital status: Single    Spouse name: Not on file  . Number of children: Not on file  . Years of education: Not on file  . Highest education level: Not on file  Occupational History  . Not on file  Social Needs  . Financial resource strain: Not on file  . Food insecurity:    Worry: Not on file    Inability: Not on file  . Transportation needs:    Medical: Not on file    Non-medical: Not on file  Tobacco Use  . Smoking status: Never Smoker  . Smokeless tobacco: Never Used  Substance and Sexual Activity  . Alcohol use: No  . Drug use: No  . Sexual activity: Never    Birth control/protection: None  Lifestyle  . Physical activity:    Days per week: Not on file    Minutes per session: Not on file  . Stress: Not on file  Relationships  . Social connections:    Talks on phone: Not on file    Gets together: Not on file    Attends  religious service: Not on file    Active member of club or organization: Not on file    Attends meetings of clubs or organizations: Not on file    Relationship status: Not on file  . Intimate partner violence:    Fear of current or ex partner: Not on file    Emotionally abused: Not on file    Physically abused: Not on file    Forced sexual activity: Not on file  Other Topics Concern  . Not on file  Social History Narrative  . Not on file    Allergies  Allergen Reactions  . Shellfish Allergy Anaphylaxis    Current Outpatient Medications  Medication Sig Dispense Refill  . Aspirin-Acetaminophen-Caffeine (GOODY HEADACHE PO) Take 1-2 packets by mouth daily as needed (headaches).    . clindamycin (CLEOCIN) 300 MG capsule Take 1 capsule (300 mg total) 4 (four) times daily by mouth. X 7 days (Patient not taking: Reported on 04/01/2018) 28 capsule 0  . clobetasol cream (TEMOVATE) 8.24 % Apply 1 application 2 (two) times daily topically. No longer than 2 weeks. (Patient not taking: Reported on 04/01/2018) 30 g 0  . famotidine (PEPCID) 20 MG tablet Take 1 tablet (20 mg total) 2 (two) times daily by mouth. (Patient not taking: Reported on 04/01/2018) 30 tablet 0  . hydrOXYzine (ATARAX/VISTARIL) 25 MG tablet Take 1  tablet (25 mg total) every 6 (six) hours by mouth. (Patient not taking: Reported on 04/01/2018) 12 tablet 0  . predniSONE (DELTASONE) 20 MG tablet 3 tabs po day one, then 2 tabs daily x 4 days (Patient not taking: Reported on 04/01/2018) 11 tablet 0   No current facility-administered medications for this visit.     REVIEW OF SYSTEMS:  [X]  denotes positive finding, [ ]  denotes negative finding Cardiac  Comments:  Chest pain or chest pressure:    Shortness of breath upon exertion:    Short of breath when lying flat:    Irregular heart rhythm:        Vascular    Pain in calf, thigh, or hip brought on by ambulation: x   Pain in feet at night that wakes you up from your sleep:  x     Blood clot in your veins:    Leg swelling:  x       Pulmonary    Oxygen at home:    Productive cough:     Wheezing:         Neurologic    Sudden weakness in arms or legs:     Sudden numbness in arms or legs:     Sudden onset of difficulty speaking or slurred speech:    Temporary loss of vision in one eye:     Problems with dizziness:         Gastrointestinal    Blood in stool:     Vomited blood:         Genitourinary    Burning when urinating:     Blood in urine:        Psychiatric    Major depression:         Hematologic    Bleeding problems:    Problems with blood clotting too easily:        Skin    Rashes or ulcers:        Constitutional    Fever or chills:      PHYSICAL EXAM: Vitals:   04/01/18 1254  BP: (!) 150/96  Pulse: 75  Resp: 18  SpO2: 98%  Weight: 288 lb 12.8 oz (131 kg)  Height: 5\' 6"  (1.676 m)    GENERAL: The patient is a well-nourished female, in no acute distress. The vital signs are documented above. CARDIOVASCULAR: 2+ radial pulses and 2+ dorsalis pedis pulses bilaterally.  Carotid arteries are without bruits bilaterally. PULMONARY: There is good air exchange  ABDOMEN: Soft and non-tender morbid obesity. MUSCULOSKELETAL: There are no major deformities or cyanosis. NEUROLOGIC: No focal weakness or paresthesias are detected. SKIN: There are no ulcers or rashes noted. PSYCHIATRIC: The patient has a normal affect.  DATA:  CT scan from 2017 was reviewed.  This shows no evidence of atherosclerotic change of her aorta iliac vessels  MEDICAL ISSUES: Had a long discussion with the patient regarding my role for exposure.  Explained the surgical procedure with a transverse left lower abdominal incision.  Explained mobilization of the rectus muscle, intraperitoneal contents, left ureter, arterial and venous structures overlying the spine.  Explained the potential for injury to all of these particular venous injury.  Patient understands and wished  to proceed.  Did explain that her biggest limitation for anterior exposure would be her morbid obesity but I do not feel that this is a prohibitive risk.  We will proceed as scheduled for 04/16/2018   Rosetta Posner, MD [facility] Vascular and Vein Specialists of  [facility] Tel [phone] Pager [phone]

## 2018-04-02 ENCOUNTER — Other Ambulatory Visit: Payer: Self-pay | Admitting: Orthopedic Surgery

## 2018-04-08 ENCOUNTER — Encounter (HOSPITAL_COMMUNITY): Payer: Self-pay

## 2018-04-08 NOTE — Pre-Procedure Instructions (Signed)
Tracy Meyer  04/08/2018      [id number] [place], [place] [address] Phone: [phone] Fax: [phone]    Your procedure is scheduled on March 25th, 2020.  Report to [facility] Admitting at 07:30 A.M.  Call this number if you have problems the morning of surgery:  [phone]   Remember:  Do not eat or drink after midnight. This is includes gum and hard candy.   Take these medicines the morning of surgery with A SIP OF WATER: NONE    Do not wear jewelry, make-up or nail polish.  Do not wear lotions, powders, or perfumes, or deodorant.  Do not shave 48 hours prior to surgery.  Men may shave face and neck.  Do not bring valuables to the hospital.  [facility] is not responsible for any belongings or valuables.  Contacts, dentures or bridgework may not be worn into surgery.  Leave your suitcase in the car.  After surgery it may be brought to your room.  For patients admitted to the hospital, discharge time will be determined by your treatment team.  Patients discharged the day of surgery will not be allowed to drive home.    - Preparing For Surgery  Before surgery, you can play an important role. Because skin is not sterile, your skin needs to be as free of germs as possible. You can reduce the number of germs on your skin by washing with CHG (chlorahexidine gluconate) Soap before surgery.  CHG is an antiseptic cleaner which kills germs and bonds with the skin to continue killing germs even after washing.    Oral Hygiene is also important to reduce your risk of infection.  Remember - BRUSH YOUR TEETH THE MORNING OF SURGERY WITH YOUR REGULAR TOOTHPASTE  Please do not use if you have an allergy to CHG or antibacterial soaps. If your skin becomes reddened/irritated stop using the CHG.  Do not shave (including legs and underarms) for at least 48 hours prior to first CHG shower. It is OK to  shave your face.  Please follow these instructions carefully.   1. Shower the NIGHT BEFORE SURGERY and the MORNING OF SURGERY with CHG.   2. If you chose to wash your hair, wash your hair first as usual with your normal shampoo.  3. After you shampoo, rinse your hair and body thoroughly to remove the shampoo.  4. Use CHG as you would any other liquid soap. You can apply CHG directly to the skin and wash gently with a scrungie or a clean washcloth.   5. Apply the CHG Soap to your body ONLY FROM THE NECK DOWN.  Do not use on open wounds or open sores. Avoid contact with your eyes, ears, mouth and genitals (private parts). Wash Face and genitals (private parts)  with your normal soap.  6. Wash thoroughly, paying special attention to the area where your surgery will be performed.  7. Thoroughly rinse your body with warm water from the neck down.  8. DO NOT shower/wash with your normal soap after using and rinsing off the CHG Soap.  9. Pat yourself dry with a CLEAN TOWEL.  10. Wear CLEAN PAJAMAS to bed the night before surgery, wear comfortable clothes the morning of surgery  11. Place CLEAN SHEETS on your bed the night of your first shower and DO NOT SLEEP WITH PETS.    Day of Surgery:  Do not apply any  deodorants/lotions.  Please wear clean clothes to the hospital/surgery center.   Remember to brush your teeth WITH YOUR REGULAR TOOTHPASTE.

## 2018-04-08 NOTE — Pre-Procedure Instructions (Signed)
Jenean Escandon  04/08/2018      [id number] - [place], [place] [address] Phone: [phone] Fax: [phone]    Your procedure is scheduled on March 26th, 2020.  Report to [facility] Admitting at 05:30 A.M.  Call this number if you have problems the morning of surgery:  [phone]   Remember:  Do not eat or drink after midnight. This includes gum and hard candy.    Take these medicines the morning of surgery with A SIP OF WATER: NONE    Do not wear jewelry, make-up or nail polish.  Do not wear lotions, powders, or perfumes, or deodorant.  Do not shave 48 hours prior to surgery.  Men may shave face and neck.  Do not bring valuables to the hospital.  [facility] is not responsible for any belongings or valuables.  Contacts, dentures or bridgework may not be worn into surgery.  Leave your suitcase in the car.  After surgery it may be brought to your room.  For patients admitted to the hospital, discharge time will be determined by your treatment team.  Patients discharged the day of surgery will not be allowed to drive home.    [place]- Preparing For Surgery  Before surgery, you can play an important role. Because skin is not sterile, your skin needs to be as free of germs as possible. You can reduce the number of germs on your skin by washing with CHG (chlorahexidine gluconate) Soap before surgery.  CHG is an antiseptic cleaner which kills germs and bonds with the skin to continue killing germs even after washing.    Oral Hygiene is also important to reduce your risk of infection.  Remember - BRUSH YOUR TEETH THE MORNING OF SURGERY WITH YOUR REGULAR TOOTHPASTE  Please do not use if you have an allergy to CHG or antibacterial soaps. If your skin becomes reddened/irritated stop using the CHG.  Do not shave (including legs and underarms) for at least 48 hours prior to first CHG shower. It is OK to shave  your face.  Please follow these instructions carefully.   1. Shower the NIGHT BEFORE SURGERY and the MORNING OF SURGERY with CHG.   2. If you chose to wash your hair, wash your hair first as usual with your normal shampoo.  3. After you shampoo, rinse your hair and body thoroughly to remove the shampoo.  4. Use CHG as you would any other liquid soap. You can apply CHG directly to the skin and wash gently with a scrungie or a clean washcloth.   5. Apply the CHG Soap to your body ONLY FROM THE NECK DOWN.  Do not use on open wounds or open sores. Avoid contact with your eyes, ears, mouth and genitals (private parts). Wash Face and genitals (private parts)  with your normal soap.  6. Wash thoroughly, paying special attention to the area where your surgery will be performed.  7. Thoroughly rinse your body with warm water from the neck down.  8. DO NOT shower/wash with your normal soap after using and rinsing off the CHG Soap.  9. Pat yourself dry with a CLEAN TOWEL.  10. Wear CLEAN PAJAMAS to bed the night before surgery, wear comfortable clothes the morning of surgery  11. Place CLEAN SHEETS on your bed the night of your first shower and DO NOT SLEEP WITH PETS.    Day of Surgery:  Do not apply any  deodorants/lotions.  Please wear clean clothes to the hospital/surgery center.   Remember to brush your teeth WITH YOUR REGULAR TOOTHPASTE.

## 2018-04-09 ENCOUNTER — Encounter (HOSPITAL_COMMUNITY): Payer: Self-pay

## 2018-04-09 ENCOUNTER — Other Ambulatory Visit: Payer: Self-pay

## 2018-04-09 ENCOUNTER — Encounter (HOSPITAL_COMMUNITY)
Admission: RE | Admit: 2018-04-09 | Discharge: 2018-04-09 | Disposition: A | Discharge status: Home / Self Care | Payer: Managed Care, Other (non HMO) | Source: Ambulatory Visit | Attending: Orthopedic Surgery | Admitting: Orthopedic Surgery

## 2018-04-09 DIAGNOSIS — Z01812 Encounter for preprocedural laboratory examination: Secondary | ICD-10-CM | POA: Diagnosis not present

## 2018-04-09 LAB — CBC WITH DIFFERENTIAL/PLATELET
Abs Immature Granulocytes: 0.02 10*3/uL (ref 0.00–0.07)
Basophils Absolute: 0 10*3/uL (ref 0.0–0.1)
Basophils Relative: 1 %
Eosinophils Absolute: 0.4 10*3/uL (ref 0.0–0.5)
Eosinophils Relative: 6 %
HCT: 41.3 % (ref 36.0–46.0)
Hemoglobin: 12.5 g/dL (ref 12.0–15.0)
Immature Granulocytes: 0 %
Lymphocytes Relative: 43 %
Lymphs Abs: 2.6 10*3/uL (ref 0.7–4.0)
MCH: 29.3 pg (ref 26.0–34.0)
MCHC: 30.3 g/dL (ref 30.0–36.0)
MCV: 96.7 fL (ref 80.0–100.0)
Monocytes Absolute: 0.4 10*3/uL (ref 0.1–1.0)
Monocytes Relative: 7 %
NEUTROS ABS: 2.6 10*3/uL (ref 1.7–7.7)
Neutrophils Relative %: 43 %
Platelets: 274 10*3/uL (ref 150–400)
RBC: 4.27 MIL/uL (ref 3.87–5.11)
RDW: 13.3 % (ref 11.5–15.5)
WBC: 6 10*3/uL (ref 4.0–10.5)
nRBC: 0 % (ref 0.0–0.2)

## 2018-04-09 LAB — COMPREHENSIVE METABOLIC PANEL
ALK PHOS: 68 U/L (ref 38–126)
ALT: 16 U/L (ref 0–44)
AST: 16 U/L (ref 15–41)
Albumin: 3.3 g/dL — ABNORMAL LOW (ref 3.5–5.0)
Anion gap: 7 (ref 5–15)
BUN: 7 mg/dL (ref 6–20)
CO2: 26 mmol/L (ref 22–32)
Calcium: 8.6 mg/dL — ABNORMAL LOW (ref 8.9–10.3)
Chloride: 108 mmol/L (ref 98–111)
Creatinine, Ser: 0.97 mg/dL (ref 0.44–1.00)
GFR calc Af Amer: >60 mL/min (ref >60)
GFR calc non Af Amer: >60 mL/min (ref >60)
Glucose, Bld: 115 mg/dL — ABNORMAL HIGH (ref 70–99)
Potassium: 4.1 mmol/L (ref 3.5–5.1)
Sodium: 141 mmol/L (ref 135–145)
Total Bilirubin: 0.8 mg/dL (ref 0.3–1.2)
Total Protein: 6.2 g/dL — ABNORMAL LOW (ref 6.5–8.1)

## 2018-04-09 LAB — TYPE AND SCREEN
ABO/RH(D): O POS
Antibody Screen: NEGATIVE

## 2018-04-09 LAB — ABO/RH: ABO/RH(D): O POS

## 2018-04-09 LAB — URINALYSIS, ROUTINE W REFLEX MICROSCOPIC
Bilirubin Urine: NEGATIVE
Glucose, UA: NEGATIVE mg/dL
Hgb urine dipstick: NEGATIVE
Ketones, ur: NEGATIVE mg/dL
Leukocytes,Ua: NEGATIVE
NITRITE: NEGATIVE
Protein, ur: NEGATIVE mg/dL
Specific Gravity, Urine: 1.011 (ref 1.005–1.030)
pH: 7 (ref 5.0–8.0)

## 2018-04-09 LAB — SURGICAL PCR SCREEN
MRSA, PCR: NEGATIVE
STAPHYLOCOCCUS AUREUS: NEGATIVE

## 2018-04-09 LAB — PROTIME-INR
INR: 1 (ref 0.8–1.2)
Prothrombin Time: 12.6 seconds (ref 11.4–15.2)

## 2018-04-09 LAB — APTT: aPTT: 26 seconds (ref 24–36)

## 2018-04-09 MED ORDER — CHLORHEXIDINE GLUCONATE 4 % EX LIQD: 60.0000 mL | Freq: Once | CUTANEOUS | Status: DC

## 2018-04-16 ENCOUNTER — Inpatient Hospital Stay (HOSPITAL_COMMUNITY)
Admission: RE | Admit: 2018-04-16 | Payer: Managed Care, Other (non HMO) | Source: Home / Self Care | Admitting: Orthopedic Surgery

## 2018-04-16 ENCOUNTER — Encounter (HOSPITAL_COMMUNITY): Admission: RE | Payer: Self-pay | Source: Home / Self Care

## 2018-04-16 SURGERY — ANTERIOR LATERAL LUMBAR FUSION WITH PERCUTANEOUS SCREW 1 LEVEL
Anesthesia: General

## 2018-04-17 ENCOUNTER — Inpatient Hospital Stay (HOSPITAL_COMMUNITY)
Admission: RE | Admit: 2018-04-17 | Payer: Managed Care, Other (non HMO) | Source: Home / Self Care | Admitting: Orthopedic Surgery

## 2018-04-17 SURGERY — POSTERIOR LUMBAR FUSION 1 LEVEL
Anesthesia: General

## 2018-06-02 ENCOUNTER — Other Ambulatory Visit: Payer: Self-pay | Admitting: *Deleted

## 2018-06-03 ENCOUNTER — Other Ambulatory Visit: Payer: Self-pay | Admitting: Orthopedic Surgery

## 2018-06-10 ENCOUNTER — Other Ambulatory Visit: Payer: Self-pay | Admitting: *Deleted

## 2018-06-19 NOTE — Pre-Procedure Instructions (Addendum)
Samina Weekes  06/19/2018    [id number] [address], [place] [address] Phone: [phone] Fax: [phone]   Your procedure is scheduled on Wednesday, June 3rd.  Report to [facility]  Entrance A at 6:30 A.M.  Call this number if you have problems the morning of surgery:  [phone]   Remember:  Do not eat after midnight.  You may drink clear liquids until 5:30 A.M.  Clear liquids allowed are:   Water, Juice (non-citric and without pulp), Carbonated beverages, Clear Tea, Black Coffee only, Plain Jell-O only, Gatorade and Plain Popsicles only  Please complete your PRE-SURGERY ENSURE that was provided to you 3 hours prior to you surgery start time.  Please, if able, drink it in one setting. DO NOT SIP.    Take these medicines the morning of surgery with A SIP OF WATER  NONE  Follow your surgeon's instructions on when to stop Aspirin.  If no instructions were given by your surgeon then you will need to call the office to get those instructions.    7 days prior to surgery STOP taking any Aspirin (unless otherwise instructed by your surgeon), Aleve, Naproxen, Ibuprofen, Motrin, Advil, Goody's, BC's, all herbal medications, fish oil, and all vitamins.    Do not wear jewelry, make-up or nail polish.  Do not wear lotions, powders, or perfumes, or deodorant.  Do not shave 48 hours prior to surgery.    Do not bring valuables to the hospital.  [facility] is not responsible for any belongings or valuables.  Contacts, dentures or bridgework may not be worn into surgery.  Leave your suitcase in the car.  After surgery it may be brought to your room.  For patients admitted to the hospital, discharge time will be determined by your treatment team.  Patients discharged the day of surgery will not be allowed to drive home.   Special instructions:   [place]- Preparing For Surgery  Before surgery, you can play an important  role. Because skin is not sterile, your skin needs to be as free of germs as possible. You can reduce the number of germs on your skin by washing with CHG (chlorahexidine gluconate) Soap before surgery.  CHG is an antiseptic cleaner which kills germs and bonds with the skin to continue killing germs even after washing.    Oral Hygiene is also important to reduce your risk of infection.  Remember - BRUSH YOUR TEETH THE MORNING OF SURGERY WITH YOUR REGULAR TOOTHPASTE  Please do not use if you have an allergy to CHG or antibacterial soaps. If your skin becomes reddened/irritated stop using the CHG.  Do not shave (including legs and underarms) for at least 48 hours prior to first CHG shower. It is OK to shave your face.  Please follow these instructions carefully.   1. Shower the NIGHT BEFORE SURGERY and the MORNING OF SURGERY with CHG.   2. If you chose to wash your hair, wash your hair first as usual with your normal shampoo.  3. After you shampoo, rinse your hair and body thoroughly to remove the shampoo.  4. Use CHG as you would any other liquid soap. You can apply CHG directly to the skin and wash gently with a scrungie or a clean washcloth.   5. Apply the CHG Soap to your body ONLY FROM THE NECK DOWN.  Do not use on open wounds or open sores. Avoid contact with your eyes, ears, mouth  and genitals (private parts). Wash Face and genitals (private parts)  with your normal soap.  6. Wash thoroughly, paying special attention to the area where your surgery will be performed.  7. Thoroughly rinse your body with warm water from the neck down.  8. DO NOT shower/wash with your normal soap after using and rinsing off the CHG Soap.  9. Pat yourself dry with a CLEAN TOWEL.  10. Wear CLEAN PAJAMAS to bed the night before surgery, wear comfortable clothes the morning of surgery  11. Place CLEAN SHEETS on your bed the night of your first shower and DO NOT SLEEP WITH PETS.  Day of Surgery:  Do not  apply any deodorants/lotions.  Please wear clean clothes to the hospital/surgery center.   Remember to brush your teeth WITH YOUR REGULAR TOOTHPASTE.  Please read over the following fact sheets that you were given. Pain Booklet, Coughing and Deep Breathing, MRSA Information and Surgical Site Infection Prevention

## 2018-06-20 ENCOUNTER — Other Ambulatory Visit (HOSPITAL_COMMUNITY)
Admission: RE | Admit: 2018-06-20 | Discharge: 2018-06-20 | Disposition: A | Discharge status: Home / Self Care | Payer: Managed Care, Other (non HMO) | Source: Ambulatory Visit | Attending: Orthopedic Surgery | Admitting: Orthopedic Surgery

## 2018-06-20 ENCOUNTER — Other Ambulatory Visit: Payer: Self-pay

## 2018-06-20 ENCOUNTER — Encounter (HOSPITAL_COMMUNITY)
Admission: RE | Admit: 2018-06-20 | Discharge: 2018-06-20 | Disposition: A | Discharge status: Home / Self Care | Payer: Managed Care, Other (non HMO) | Source: Ambulatory Visit | Attending: Orthopedic Surgery | Admitting: Orthopedic Surgery

## 2018-06-20 DIAGNOSIS — Z1159 Encounter for screening for other viral diseases: Secondary | ICD-10-CM | POA: Insufficient documentation

## 2018-06-20 DIAGNOSIS — Z01812 Encounter for preprocedural laboratory examination: Secondary | ICD-10-CM | POA: Insufficient documentation

## 2018-06-20 LAB — COMPREHENSIVE METABOLIC PANEL
ALT: 18 U/L (ref 0–44)
AST: 20 U/L (ref 15–41)
Albumin: 3.5 g/dL (ref 3.5–5.0)
Alkaline Phosphatase: 68 U/L (ref 38–126)
Anion gap: 7 (ref 5–15)
BUN: 8 mg/dL (ref 6–20)
CO2: 25 mmol/L (ref 22–32)
Calcium: 8.4 mg/dL — ABNORMAL LOW (ref 8.9–10.3)
Chloride: 105 mmol/L (ref 98–111)
Creatinine, Ser: 0.95 mg/dL (ref 0.44–1.00)
GFR calc Af Amer: >60 mL/min (ref >60)
GFR calc non Af Amer: >60 mL/min (ref >60)
Glucose, Bld: 110 mg/dL — ABNORMAL HIGH (ref 70–99)
Potassium: 3.8 mmol/L (ref 3.5–5.1)
Sodium: 137 mmol/L (ref 135–145)
Total Bilirubin: 0.9 mg/dL (ref 0.3–1.2)
Total Protein: 6.6 g/dL (ref 6.5–8.1)

## 2018-06-20 LAB — TYPE AND SCREEN
ABO/RH(D): O POS
Antibody Screen: NEGATIVE

## 2018-06-20 LAB — CBC WITH DIFFERENTIAL/PLATELET
Abs Immature Granulocytes: 0.01 10*3/uL (ref 0.00–0.07)
Basophils Absolute: 0 10*3/uL (ref 0.0–0.1)
Basophils Relative: 0 %
Eosinophils Absolute: 0.2 10*3/uL (ref 0.0–0.5)
Eosinophils Relative: 4 %
HCT: 39.6 % (ref 36.0–46.0)
Hemoglobin: 12.6 g/dL (ref 12.0–15.0)
Immature Granulocytes: 0 %
Lymphocytes Relative: 45 %
Lymphs Abs: 2.8 10*3/uL (ref 0.7–4.0)
MCH: 30.1 pg (ref 26.0–34.0)
MCHC: 31.8 g/dL (ref 30.0–36.0)
MCV: 94.5 fL (ref 80.0–100.0)
Monocytes Absolute: 0.6 10*3/uL (ref 0.1–1.0)
Monocytes Relative: 9 %
Neutro Abs: 2.6 10*3/uL (ref 1.7–7.7)
Neutrophils Relative %: 42 %
Platelets: 284 10*3/uL (ref 150–400)
RBC: 4.19 MIL/uL (ref 3.87–5.11)
RDW: 13.5 % (ref 11.5–15.5)
WBC: 6.2 10*3/uL (ref 4.0–10.5)
nRBC: 0 % (ref 0.0–0.2)

## 2018-06-20 LAB — URINALYSIS, ROUTINE W REFLEX MICROSCOPIC
Bilirubin Urine: NEGATIVE
Glucose, UA: NEGATIVE mg/dL
Hgb urine dipstick: NEGATIVE
Ketones, ur: NEGATIVE mg/dL
Leukocytes,Ua: NEGATIVE
Nitrite: NEGATIVE
Protein, ur: NEGATIVE mg/dL
Specific Gravity, Urine: 1.023 (ref 1.005–1.030)
pH: 5 (ref 5.0–8.0)

## 2018-06-20 LAB — APTT: aPTT: 26 seconds (ref 24–36)

## 2018-06-20 LAB — SURGICAL PCR SCREEN
MRSA, PCR: NEGATIVE
Staphylococcus aureus: NEGATIVE

## 2018-06-20 LAB — PROTIME-INR
INR: 1.1 (ref 0.8–1.2)
Prothrombin Time: 13.6 seconds (ref 11.4–15.2)

## 2018-06-20 NOTE — Progress Notes (Addendum)
PCP - Dr. Lysle Rubens [place] Cardiologist - denies  Chest x-ray - denies EKG - denies  Stress Test - denies ECHO - denies Cardiac Cath - denies Sleep Study - denies CPAP - denies  Blood Thinner Instructions: N/A Aspirin Instructions: N/A  Anesthesia review:NO  Coronavirus Screening  Have you experienced the following symptoms:  Cough yes/no: No Fever (>100.54F)  yes/no: No Runny nose yes/no: No Sore throat yes/no: No Difficulty breathing/shortness of breath  yes/no: No  Have you or a family member traveled in the last 14 days and where? yes/no: No  If the patient indicates "YES" to the above questions, their PAT will be rescheduled to limit the exposure to others and, the surgeon will be notified. THE PATIENT WILL NEED TO BE ASYMPTOMATIC FOR 14 DAYS.   If the patient is not experiencing any of these symptoms, the PAT nurse will instruct them to NOT bring anyone with them to their appointment since they may have these symptoms or traveled as well.   Please remind your patients and families that hospital visitation restrictions are in effect and the importance of the restrictions.   Patient denies shortness of breath, fever, cough and chest pain at PAT appointment  Patient verbalized understanding of instructions that were given to them at the PAT appointment. Patient was also instructed that they will need to review over the PAT instructions again at home before surgery.   *Pre- Surgery Ensure drink given

## 2018-06-21 LAB — NOVEL CORONAVIRUS, NAA (HOSP ORDER, SEND-OUT TO REF LAB; TAT 18-24 HRS): SARS-CoV-2, NAA: NOT DETECTED

## 2018-06-24 MED ORDER — DEXTROSE 5 % IV SOLN
3.0000 g | INTRAVENOUS | Status: AC
  Administered 2018-06-25 (×2): 3 g via INTRAVENOUS
  Filled 2018-06-24 (×2): qty 3000

## 2018-06-25 ENCOUNTER — Inpatient Hospital Stay (HOSPITAL_COMMUNITY)
Admission: RE | Disposition: A | Discharge status: Home Health | Payer: Self-pay | Source: Home / Self Care | Attending: Orthopedic Surgery

## 2018-06-25 ENCOUNTER — Inpatient Hospital Stay (HOSPITAL_COMMUNITY): Payer: Managed Care, Other (non HMO) | Admitting: Certified Registered Nurse Anesthetist

## 2018-06-25 ENCOUNTER — Encounter (HOSPITAL_COMMUNITY): Payer: Self-pay | Admitting: *Deleted

## 2018-06-25 ENCOUNTER — Inpatient Hospital Stay (HOSPITAL_COMMUNITY): Payer: Managed Care, Other (non HMO)

## 2018-06-25 ENCOUNTER — Inpatient Hospital Stay (HOSPITAL_COMMUNITY)
Admission: RE | Admit: 2018-06-25 | Discharge: 2018-06-27 | DRG: 454 | Disposition: A | Discharge status: Home Health | Payer: Managed Care, Other (non HMO) | Attending: Orthopedic Surgery | Admitting: Orthopedic Surgery

## 2018-06-25 DIAGNOSIS — Z800 Family history of malignant neoplasm of digestive organs: Secondary | ICD-10-CM | POA: Diagnosis not present

## 2018-06-25 DIAGNOSIS — M5117 Intervertebral disc disorders with radiculopathy, lumbosacral region: Secondary | ICD-10-CM | POA: Diagnosis present

## 2018-06-25 DIAGNOSIS — M48061 Spinal stenosis, lumbar region without neurogenic claudication: Secondary | ICD-10-CM | POA: Diagnosis present

## 2018-06-25 DIAGNOSIS — Z808 Family history of malignant neoplasm of other organs or systems: Secondary | ICD-10-CM

## 2018-06-25 DIAGNOSIS — Z9049 Acquired absence of other specified parts of digestive tract: Secondary | ICD-10-CM | POA: Diagnosis not present

## 2018-06-25 DIAGNOSIS — M5137 Other intervertebral disc degeneration, lumbosacral region: Secondary | ICD-10-CM | POA: Diagnosis not present

## 2018-06-25 DIAGNOSIS — M5116 Intervertebral disc disorders with radiculopathy, lumbar region: Secondary | ICD-10-CM | POA: Diagnosis present

## 2018-06-25 DIAGNOSIS — Z79899 Other long term (current) drug therapy: Secondary | ICD-10-CM | POA: Diagnosis not present

## 2018-06-25 DIAGNOSIS — Z91013 Allergy to seafood: Secondary | ICD-10-CM

## 2018-06-25 DIAGNOSIS — Z419 Encounter for procedure for purposes other than remedying health state, unspecified: Secondary | ICD-10-CM

## 2018-06-25 DIAGNOSIS — Z6841 Body Mass Index (BMI) 40.0 and over, adult: Secondary | ICD-10-CM | POA: Diagnosis not present

## 2018-06-25 DIAGNOSIS — M5410 Radiculopathy, site unspecified: Secondary | ICD-10-CM | POA: Diagnosis present

## 2018-06-25 HISTORY — PX: ABDOMINAL EXPOSURE: SHX5708

## 2018-06-25 HISTORY — PX: ANTERIOR LUMBAR FUSION: SHX1170

## 2018-06-25 LAB — URINALYSIS, ROUTINE W REFLEX MICROSCOPIC
Bacteria, UA: NONE SEEN
Bilirubin Urine: NEGATIVE
Glucose, UA: NEGATIVE mg/dL
Ketones, ur: NEGATIVE mg/dL
Nitrite: NEGATIVE
Protein, ur: NEGATIVE mg/dL
Specific Gravity, Urine: 1.02 (ref 1.005–1.030)
pH: 7 (ref 5.0–8.0)

## 2018-06-25 LAB — COMPREHENSIVE METABOLIC PANEL
ALT: 14 U/L (ref 0–44)
AST: 24 U/L (ref 15–41)
Albumin: 3.5 g/dL (ref 3.5–5.0)
Alkaline Phosphatase: 66 U/L (ref 38–126)
Anion gap: 9 (ref 5–15)
BUN: <5 mg/dL — ABNORMAL LOW (ref 6–20)
CO2: 25 mmol/L (ref 22–32)
Calcium: 8.5 mg/dL — ABNORMAL LOW (ref 8.9–10.3)
Chloride: 103 mmol/L (ref 98–111)
Creatinine, Ser: 0.82 mg/dL (ref 0.44–1.00)
GFR calc Af Amer: >60 mL/min (ref >60)
GFR calc non Af Amer: >60 mL/min (ref >60)
Glucose, Bld: 123 mg/dL — ABNORMAL HIGH (ref 70–99)
Potassium: 4.3 mmol/L (ref 3.5–5.1)
Sodium: 137 mmol/L (ref 135–145)
Total Bilirubin: 0.8 mg/dL (ref 0.3–1.2)
Total Protein: 6.3 g/dL — ABNORMAL LOW (ref 6.5–8.1)

## 2018-06-25 LAB — CBC WITH DIFFERENTIAL/PLATELET
Abs Immature Granulocytes: 0.05 10*3/uL (ref 0.00–0.07)
Basophils Absolute: 0 10*3/uL (ref 0.0–0.1)
Basophils Relative: 0 %
Eosinophils Absolute: 0 10*3/uL (ref 0.0–0.5)
Eosinophils Relative: 0 %
HCT: 36.5 % (ref 36.0–46.0)
Hemoglobin: 11.9 g/dL — ABNORMAL LOW (ref 12.0–15.0)
Immature Granulocytes: 0 %
Lymphocytes Relative: 12 %
Lymphs Abs: 1.4 10*3/uL (ref 0.7–4.0)
MCH: 30.3 pg (ref 26.0–34.0)
MCHC: 32.6 g/dL (ref 30.0–36.0)
MCV: 92.9 fL (ref 80.0–100.0)
Monocytes Absolute: 0.6 10*3/uL (ref 0.1–1.0)
Monocytes Relative: 5 %
Neutro Abs: 9.7 10*3/uL — ABNORMAL HIGH (ref 1.7–7.7)
Neutrophils Relative %: 83 %
Platelets: 248 10*3/uL (ref 150–400)
RBC: 3.93 MIL/uL (ref 3.87–5.11)
RDW: 13.4 % (ref 11.5–15.5)
WBC: 11.7 10*3/uL — ABNORMAL HIGH (ref 4.0–10.5)
nRBC: 0 % (ref 0.0–0.2)

## 2018-06-25 LAB — APTT: aPTT: 26 seconds (ref 24–36)

## 2018-06-25 LAB — PROTIME-INR
INR: 1.1 (ref 0.8–1.2)
Prothrombin Time: 13.8 seconds (ref 11.4–15.2)

## 2018-06-25 SURGERY — ANTERIOR LUMBAR FUSION 2 LEVELS
Anesthesia: General

## 2018-06-25 MED ORDER — BUPIVACAINE-EPINEPHRINE (PF) 0.25% -1:200000 IJ SOLN
INTRAMUSCULAR | Status: AC
  Filled 2018-06-25: qty 30

## 2018-06-25 MED ORDER — BISACODYL 5 MG PO TBEC: 5.0000 mg | DELAYED_RELEASE_TABLET | Freq: Every day | ORAL | Status: DC | PRN

## 2018-06-25 MED ORDER — PHENYLEPHRINE 40 MCG/ML (10ML) SYRINGE FOR IV PUSH (FOR BLOOD PRESSURE SUPPORT)
PREFILLED_SYRINGE | INTRAVENOUS | Status: AC
  Filled 2018-06-25: qty 10

## 2018-06-25 MED ORDER — THROMBIN 20000 UNITS EX SOLR
CUTANEOUS | Status: DC | PRN
  Administered 2018-06-25: 20000 [IU] via TOPICAL

## 2018-06-25 MED ORDER — ACETAMINOPHEN 500 MG PO TABS
ORAL_TABLET | ORAL | Status: AC
  Administered 2018-06-25: 1000 mg via ORAL
  Filled 2018-06-25: qty 2

## 2018-06-25 MED ORDER — ONDANSETRON HCL 4 MG/2ML IJ SOLN
INTRAMUSCULAR | Status: AC
  Filled 2018-06-25: qty 4

## 2018-06-25 MED ORDER — HYDROMORPHONE HCL 1 MG/ML IJ SOLN
INTRAMUSCULAR | Status: AC
  Filled 2018-06-25: qty 1

## 2018-06-25 MED ORDER — FLEET ENEMA 7-19 GM/118ML RE ENEM: 1.0000 | ENEMA | Freq: Once | RECTAL | Status: DC | PRN

## 2018-06-25 MED ORDER — FENTANYL CITRATE (PF) 250 MCG/5ML IJ SOLN
INTRAMUSCULAR | Status: DC | PRN
  Administered 2018-06-25 (×2): 50 ug via INTRAVENOUS
  Administered 2018-06-25: 100 ug via INTRAVENOUS
  Administered 2018-06-25: 50 ug via INTRAVENOUS

## 2018-06-25 MED ORDER — POTASSIUM CHLORIDE IN NACL 20-0.9 MEQ/L-% IV SOLN
INTRAVENOUS | Status: DC
  Administered 2018-06-26: 12:00:00 via INTRAVENOUS
  Filled 2018-06-25: qty 1000

## 2018-06-25 MED ORDER — 0.9 % SODIUM CHLORIDE (POUR BTL) OPTIME
TOPICAL | Status: DC | PRN
  Administered 2018-06-25 (×2): 1000 mL

## 2018-06-25 MED ORDER — SODIUM CHLORIDE 0.9% FLUSH
3.0000 mL | Freq: Two times a day (BID) | INTRAVENOUS | Status: DC
  Administered 2018-06-25: 10 mL via INTRAVENOUS
  Administered 2018-06-26 – 2018-06-27 (×2): 3 mL via INTRAVENOUS

## 2018-06-25 MED ORDER — POVIDONE-IODINE 7.5 % EX SOLN
Freq: Once | CUTANEOUS | Status: DC
  Filled 2018-06-25: qty 118

## 2018-06-25 MED ORDER — THROMBIN (RECOMBINANT) 20000 UNITS EX SOLR
CUTANEOUS | Status: AC
  Filled 2018-06-25: qty 20000

## 2018-06-25 MED ORDER — MIDAZOLAM HCL 2 MG/2ML IJ SOLN
INTRAMUSCULAR | Status: AC
  Filled 2018-06-25: qty 2

## 2018-06-25 MED ORDER — OXYCODONE HCL 5 MG/5ML PO SOLN: 5.0000 mg | Freq: Once | ORAL | Status: DC | PRN

## 2018-06-25 MED ORDER — LIDOCAINE 2% (20 MG/ML) 5 ML SYRINGE
INTRAMUSCULAR | Status: AC
  Filled 2018-06-25: qty 5

## 2018-06-25 MED ORDER — POVIDONE-IODINE 7.5 % EX SOLN
Freq: Once | CUTANEOUS | Status: DC
  Filled 2018-06-25 (×2): qty 118

## 2018-06-25 MED ORDER — BUPIVACAINE-EPINEPHRINE (PF) 0.5% -1:200000 IJ SOLN
INTRAMUSCULAR | Status: AC
  Filled 2018-06-25: qty 30

## 2018-06-25 MED ORDER — PROMETHAZINE HCL 25 MG/ML IJ SOLN: 6.2500 mg | INTRAMUSCULAR | Status: DC | PRN

## 2018-06-25 MED ORDER — ALBUMIN HUMAN 5 % IV SOLN
INTRAVENOUS | Status: DC | PRN
  Administered 2018-06-25: 11:00:00 via INTRAVENOUS

## 2018-06-25 MED ORDER — PROPOFOL 10 MG/ML IV BOLUS
INTRAVENOUS | Status: AC
  Filled 2018-06-25: qty 20

## 2018-06-25 MED ORDER — DEXAMETHASONE SODIUM PHOSPHATE 10 MG/ML IJ SOLN
INTRAMUSCULAR | Status: AC
  Filled 2018-06-25: qty 1

## 2018-06-25 MED ORDER — ZOLPIDEM TARTRATE 5 MG PO TABS: 5.0000 mg | ORAL_TABLET | Freq: Every evening | ORAL | Status: DC | PRN

## 2018-06-25 MED ORDER — ACETAMINOPHEN 325 MG PO TABS: 650.0000 mg | ORAL_TABLET | ORAL | Status: DC | PRN

## 2018-06-25 MED ORDER — SUCCINYLCHOLINE CHLORIDE 200 MG/10ML IV SOSY
PREFILLED_SYRINGE | INTRAVENOUS | Status: AC
  Filled 2018-06-25: qty 10

## 2018-06-25 MED ORDER — SUGAMMADEX SODIUM 200 MG/2ML IV SOLN
INTRAVENOUS | Status: DC | PRN
  Administered 2018-06-25: 262.2 mg via INTRAVENOUS

## 2018-06-25 MED ORDER — PHENOL 1.4 % MT LIQD: 1.0000 | OROMUCOSAL | Status: DC | PRN

## 2018-06-25 MED ORDER — MIDAZOLAM HCL 5 MG/5ML IJ SOLN
INTRAMUSCULAR | Status: DC | PRN
  Administered 2018-06-25: 1 mg via INTRAVENOUS

## 2018-06-25 MED ORDER — DIAZEPAM 5 MG PO TABS: 5.0000 mg | ORAL_TABLET | Freq: Four times a day (QID) | ORAL | Status: DC | PRN

## 2018-06-25 MED ORDER — OXYCODONE HCL 5 MG PO TABS: 5.0000 mg | ORAL_TABLET | Freq: Once | ORAL | Status: DC | PRN

## 2018-06-25 MED ORDER — OXYCODONE-ACETAMINOPHEN 5-325 MG PO TABS
1.0000 | ORAL_TABLET | ORAL | Status: DC | PRN
  Administered 2018-06-25 – 2018-06-27 (×8): 2 via ORAL
  Filled 2018-06-25 (×8): qty 2

## 2018-06-25 MED ORDER — ACETAMINOPHEN 650 MG RE SUPP: 650.0000 mg | RECTAL | Status: DC | PRN

## 2018-06-25 MED ORDER — MORPHINE SULFATE (PF) 2 MG/ML IV SOLN
1.0000 mg | INTRAVENOUS | Status: DC | PRN
  Administered 2018-06-25 – 2018-06-26 (×2): 2 mg via INTRAVENOUS
  Filled 2018-06-25 (×2): qty 1

## 2018-06-25 MED ORDER — ACETAMINOPHEN 500 MG PO TABS
1000.0000 mg | ORAL_TABLET | Freq: Once | ORAL | Status: AC
  Administered 2018-06-25: 1000 mg via ORAL

## 2018-06-25 MED ORDER — ALUM & MAG HYDROXIDE-SIMETH 200-200-20 MG/5ML PO SUSP: 30.0000 mL | Freq: Four times a day (QID) | ORAL | Status: DC | PRN

## 2018-06-25 MED ORDER — HYDROMORPHONE HCL 1 MG/ML IJ SOLN
0.2500 mg | INTRAMUSCULAR | Status: DC | PRN
  Administered 2018-06-25 (×4): 0.5 mg via INTRAVENOUS

## 2018-06-25 MED ORDER — LACTATED RINGERS IV SOLN
INTRAVENOUS | Status: DC | PRN
  Administered 2018-06-25 (×2): via INTRAVENOUS

## 2018-06-25 MED ORDER — ROCURONIUM BROMIDE 10 MG/ML (PF) SYRINGE
PREFILLED_SYRINGE | INTRAVENOUS | Status: AC
  Filled 2018-06-25: qty 20

## 2018-06-25 MED ORDER — PHENYLEPHRINE HCL (PRESSORS) 10 MG/ML IV SOLN
INTRAVENOUS | Status: DC | PRN
  Administered 2018-06-25: 120 ug via INTRAVENOUS
  Administered 2018-06-25 (×2): 80 ug via INTRAVENOUS
  Administered 2018-06-25: 120 ug via INTRAVENOUS

## 2018-06-25 MED ORDER — ROCURONIUM BROMIDE 50 MG/5ML IV SOSY
PREFILLED_SYRINGE | INTRAVENOUS | Status: DC | PRN
  Administered 2018-06-25: 20 mg via INTRAVENOUS
  Administered 2018-06-25: 50 mg via INTRAVENOUS
  Administered 2018-06-25: 10 mg via INTRAVENOUS
  Administered 2018-06-25 (×2): 20 mg via INTRAVENOUS

## 2018-06-25 MED ORDER — BUPIVACAINE-EPINEPHRINE (PF) 0.25% -1:200000 IJ SOLN
INTRAMUSCULAR | Status: DC | PRN
  Administered 2018-06-25: 30 mL

## 2018-06-25 MED ORDER — PROPOFOL 500 MG/50ML IV EMUL
INTRAVENOUS | Status: DC | PRN
  Administered 2018-06-25: 15 ug/kg/min via INTRAVENOUS

## 2018-06-25 MED ORDER — SENNOSIDES-DOCUSATE SODIUM 8.6-50 MG PO TABS: 1.0000 | ORAL_TABLET | Freq: Every evening | ORAL | Status: DC | PRN

## 2018-06-25 MED ORDER — ONDANSETRON HCL 4 MG/2ML IJ SOLN: 4.0000 mg | Freq: Four times a day (QID) | INTRAMUSCULAR | Status: DC | PRN

## 2018-06-25 MED ORDER — SODIUM CHLORIDE 0.9 % IV SOLN: 250.0000 mL | INTRAVENOUS | Status: DC

## 2018-06-25 MED ORDER — CEFAZOLIN SODIUM-DEXTROSE 2-4 GM/100ML-% IV SOLN
2.0000 g | Freq: Three times a day (TID) | INTRAVENOUS | Status: AC
  Administered 2018-06-25 – 2018-06-26 (×2): 2 g via INTRAVENOUS
  Filled 2018-06-25 (×3): qty 100

## 2018-06-25 MED ORDER — LIDOCAINE 2% (20 MG/ML) 5 ML SYRINGE
INTRAMUSCULAR | Status: DC | PRN
  Administered 2018-06-25: 60 mg via INTRAVENOUS

## 2018-06-25 MED ORDER — LACTATED RINGERS IV SOLN
INTRAVENOUS | Status: DC | PRN
  Administered 2018-06-25: 08:00:00 via INTRAVENOUS

## 2018-06-25 MED ORDER — ONDANSETRON HCL 4 MG PO TABS: 4.0000 mg | ORAL_TABLET | Freq: Four times a day (QID) | ORAL | Status: DC | PRN

## 2018-06-25 MED ORDER — DOCUSATE SODIUM 100 MG PO CAPS
100.0000 mg | ORAL_CAPSULE | Freq: Two times a day (BID) | ORAL | Status: DC
  Administered 2018-06-25 – 2018-06-27 (×3): 100 mg via ORAL
  Filled 2018-06-25 (×3): qty 1

## 2018-06-25 MED ORDER — SUCCINYLCHOLINE CHLORIDE 200 MG/10ML IV SOSY
PREFILLED_SYRINGE | INTRAVENOUS | Status: DC | PRN
  Administered 2018-06-25: 120 mg via INTRAVENOUS

## 2018-06-25 MED ORDER — FENTANYL CITRATE (PF) 250 MCG/5ML IJ SOLN
INTRAMUSCULAR | Status: AC
  Filled 2018-06-25: qty 5

## 2018-06-25 MED ORDER — SODIUM CHLORIDE 0.9% FLUSH
3.0000 mL | INTRAVENOUS | Status: DC | PRN
  Administered 2018-06-26: 3 mL via INTRAVENOUS
  Filled 2018-06-25: qty 3

## 2018-06-25 MED ORDER — SODIUM CHLORIDE 0.9 % IV SOLN
INTRAVENOUS | Status: DC | PRN
  Administered 2018-06-25: 25 ug/min via INTRAVENOUS

## 2018-06-25 MED ORDER — ONDANSETRON HCL 4 MG/2ML IJ SOLN
INTRAMUSCULAR | Status: DC | PRN
  Administered 2018-06-25: 4 mg via INTRAVENOUS

## 2018-06-25 MED ORDER — PROPOFOL 10 MG/ML IV BOLUS
INTRAVENOUS | Status: DC | PRN
  Administered 2018-06-25: 200 mg via INTRAVENOUS

## 2018-06-25 MED ORDER — SODIUM CHLORIDE 0.9 % IV SOLN
INTRAVENOUS | Status: AC
  Filled 2018-06-25: qty 1.2

## 2018-06-25 MED ORDER — MENTHOL 3 MG MT LOZG: 1.0000 | LOZENGE | OROMUCOSAL | Status: DC | PRN

## 2018-06-25 SURGICAL SUPPLY — 97 items
ADH SKN CLS APL DERMABOND .7 (GAUZE/BANDAGES/DRESSINGS) ×1
APL SKNCLS STERI-STRIP NONHPOA (GAUZE/BANDAGES/DRESSINGS) ×1
APPLIER CLIP 11 MED OPEN (CLIP) ×4
APR CLP MED 11 20 MLT OPN (CLIP) ×2
BENZOIN TINCTURE PRP APPL 2/3 (GAUZE/BANDAGES/DRESSINGS) ×1 IMPLANT
BLADE CLIPPER SURG (BLADE) IMPLANT
BLADE SURG 10 STRL SS (BLADE) ×2 IMPLANT
BONE VIVIGEN FORMABLE 10CC (Bone Implant) ×2 IMPLANT
CLIP APPLIE 11 MED OPEN (CLIP) ×2 IMPLANT
CLIP LIGATING EXTRA MED SLVR (CLIP) ×2 IMPLANT
CLIP LIGATING EXTRA SM BLUE (MISCELLANEOUS) ×2 IMPLANT
CLSR STERI-STRIP ANTIMIC 1/2X4 (GAUZE/BANDAGES/DRESSINGS) ×1 IMPLANT
CORDS BIPOLAR (ELECTRODE) ×2 IMPLANT
COVER SURGICAL LIGHT HANDLE (MISCELLANEOUS) ×2 IMPLANT
COVER WAND RF STERILE (DRAPES) ×4 IMPLANT
DERMABOND ADVANCED (GAUZE/BANDAGES/DRESSINGS) ×1
DERMABOND ADVANCED .7 DNX12 (GAUZE/BANDAGES/DRESSINGS) ×1 IMPLANT
DRAPE C-ARM 42X72 X-RAY (DRAPES) ×4 IMPLANT
DRAPE POUCH INSTRU U-SHP 10X18 (DRAPES) ×2 IMPLANT
DRAPE SURG 17X23 STRL (DRAPES) ×6 IMPLANT
DRSG MEPILEX BORDER 4X12 (GAUZE/BANDAGES/DRESSINGS) ×2 IMPLANT
DRSG MEPILEX BORDER 4X8 (GAUZE/BANDAGES/DRESSINGS) ×1 IMPLANT
DURAPREP 26ML APPLICATOR (WOUND CARE) ×2 IMPLANT
ELECT BLADE 4.0 EZ CLEAN MEGAD (MISCELLANEOUS) ×4
ELECT CAUTERY BLADE 6.4 (BLADE) ×2 IMPLANT
ELECT REM PT RETURN 9FT ADLT (ELECTROSURGICAL) ×2
ELECTRODE BLDE 4.0 EZ CLN MEGD (MISCELLANEOUS) ×2 IMPLANT
ELECTRODE REM PT RTRN 9FT ADLT (ELECTROSURGICAL) ×1 IMPLANT
FILTER STRAW FLUID ASPIR (MISCELLANEOUS) ×1 IMPLANT
GAUZE 4X4 16PLY RFD (DISPOSABLE) IMPLANT
GAUZE SPONGE 4X4 12PLY STRL LF (GAUZE/BANDAGES/DRESSINGS) ×1 IMPLANT
GLOVE BIO SURGEON STRL SZ7 (GLOVE) ×3 IMPLANT
GLOVE BIO SURGEON STRL SZ8 (GLOVE) ×2 IMPLANT
GLOVE BIOGEL PI IND STRL 7.0 (GLOVE) ×1 IMPLANT
GLOVE BIOGEL PI IND STRL 8 (GLOVE) ×2 IMPLANT
GLOVE BIOGEL PI INDICATOR 7.0 (GLOVE) ×1
GLOVE BIOGEL PI INDICATOR 8 (GLOVE) ×2
GLOVE SS BIOGEL STRL SZ 7.5 (GLOVE) ×1 IMPLANT
GLOVE SUPERSENSE BIOGEL SZ 7.5 (GLOVE) ×1
GOWN STRL REUS W/ TWL LRG LVL3 (GOWN DISPOSABLE) ×3 IMPLANT
GOWN STRL REUS W/ TWL XL LVL3 (GOWN DISPOSABLE) ×1 IMPLANT
GOWN STRL REUS W/TWL LRG LVL3 (GOWN DISPOSABLE) ×6
GOWN STRL REUS W/TWL XL LVL3 (GOWN DISPOSABLE) ×2
GRAFT BNE MATRIX VG FRMBL L 10 (Bone Implant) IMPLANT
HEMOSTAT SURGICEL 2X14 (HEMOSTASIS) IMPLANT
INSERT FOGARTY 61MM (MISCELLANEOUS) IMPLANT
INSERT FOGARTY SM (MISCELLANEOUS) IMPLANT
KIT BASIN OR (CUSTOM PROCEDURE TRAY) ×2 IMPLANT
KIT TURNOVER KIT B (KITS) ×2 IMPLANT
LOOP VESSEL MAXI BLUE (MISCELLANEOUS) IMPLANT
LOOP VESSEL MINI RED (MISCELLANEOUS) IMPLANT
NDL HYPO 25GX1X1/2 BEV (NEEDLE) ×1 IMPLANT
NDL SPNL 18GX3.5 QUINCKE PK (NEEDLE) ×1 IMPLANT
NEEDLE HYPO 25GX1X1/2 BEV (NEEDLE) ×2 IMPLANT
NEEDLE SPNL 18GX3.5 QUINCKE PK (NEEDLE) ×2 IMPLANT
NS IRRIG 1000ML POUR BTL (IV SOLUTION) ×2 IMPLANT
PACK LAMINECTOMY ORTHO (CUSTOM PROCEDURE TRAY) ×2 IMPLANT
PACK UNIVERSAL I (CUSTOM PROCEDURE TRAY) ×2 IMPLANT
PAD ARMBOARD 7.5X6 YLW CONV (MISCELLANEOUS) ×8 IMPLANT
PATTIES SURGICAL .5 X1 (DISPOSABLE) ×2 IMPLANT
PLATE AEGIS LUMBAR 21MM (Plate) ×1 IMPLANT
SCREW AEGIS 24MM (Screw) ×4 IMPLANT
SCREW CANCELLOUS 6.5 32MM (Screw) ×1 IMPLANT
SPONGE INTESTINAL PEANUT (DISPOSABLE) ×6 IMPLANT
SPONGE LAP 18X18 RF (DISPOSABLE) IMPLANT
SPONGE LAP 4X18 RFD (DISPOSABLE) IMPLANT
SPONGE SURGIFOAM ABS GEL 100 (HEMOSTASIS) ×4 IMPLANT
STAPLER VISISTAT 35W (STAPLE) IMPLANT
STRIP CLOSURE SKIN 1/2X4 (GAUZE/BANDAGES/DRESSINGS) IMPLANT
SURGIFLO W/THROMBIN 8M KIT (HEMOSTASIS) IMPLANT
SUT MNCRL AB 4-0 PS2 18 (SUTURE) ×2 IMPLANT
SUT PDS AB 1 CTX 36 (SUTURE) ×4 IMPLANT
SUT PROLENE 4 0 RB 1 (SUTURE)
SUT PROLENE 4-0 RB1 .5 CRCL 36 (SUTURE) IMPLANT
SUT PROLENE 5 0 C 1 24 (SUTURE) IMPLANT
SUT PROLENE 5 0 CC1 (SUTURE) IMPLANT
SUT PROLENE 6 0 C 1 30 (SUTURE) ×2 IMPLANT
SUT PROLENE 6 0 CC (SUTURE) IMPLANT
SUT SILK 0 TIES 10X30 (SUTURE) ×2 IMPLANT
SUT SILK 2 0 TIES 10X30 (SUTURE) ×4 IMPLANT
SUT SILK 2 0SH CR/8 30 (SUTURE) IMPLANT
SUT SILK 3 0 TIES 10X30 (SUTURE) ×4 IMPLANT
SUT SILK 3 0SH CR/8 30 (SUTURE) IMPLANT
SUT VIC AB 1 CT1 27 (SUTURE) ×4
SUT VIC AB 1 CT1 27XBRD ANBCTR (SUTURE) ×2 IMPLANT
SUT VIC AB 1 CTX 36 (SUTURE) ×4
SUT VIC AB 1 CTX36XBRD ANBCTR (SUTURE) ×2 IMPLANT
SUT VIC AB 2-0 CT2 18 VCP726D (SUTURE) ×3 IMPLANT
SYNCAGE EVOL MD 12X8.5 10D (Spacer) ×1 IMPLANT
SYR BULB IRRIGATION 50ML (SYRINGE) ×2 IMPLANT
TOWEL GREEN STERILE (TOWEL DISPOSABLE) ×2 IMPLANT
TOWEL OR 17X24 6PK STRL BLUE (TOWEL DISPOSABLE) ×2 IMPLANT
TOWEL OR 17X26 10 PK STRL BLUE (TOWEL DISPOSABLE) ×2 IMPLANT
TRAY FOLEY CATH SILVER 16FR (SET/KITS/TRAYS/PACK) ×2 IMPLANT
WASHER 13.0MM (Orthopedic Implant) ×1 IMPLANT
WATER STERILE IRR 1000ML POUR (IV SOLUTION) ×2 IMPLANT
YANKAUER SUCT BULB TIP NO VENT (SUCTIONS) ×2 IMPLANT

## 2018-06-25 NOTE — Anesthesia Procedure Notes (Addendum)
Procedure Name: Intubation Date/Time: 06/25/2018 8:46 AM Performed by: Glynda Jaeger, CRNA Pre-anesthesia Checklist: Patient identified, Patient being monitored, Timeout performed, Emergency Drugs available and Suction available Patient Re-evaluated:Patient Re-evaluated prior to induction Oxygen Delivery Method: Circle System Utilized Preoxygenation: Pre-oxygenation with 100% oxygen Induction Type: IV induction Laryngoscope Size: Mac and 4 Grade View: Grade II Tube type: Oral Tube size: 7.5 mm Number of attempts: 1 Airway Equipment and Method: Stylet Placement Confirmation: ETT inserted through vocal cords under direct vision,  positive ETCO2 and breath sounds checked- equal and bilateral Secured at: 23 cm Tube secured with: Tape Dental Injury: Teeth and Oropharynx as per pre-operative assessment  Comments: RSI

## 2018-06-25 NOTE — Anesthesia Postprocedure Evaluation (Signed)
Anesthesia Post Note  Patient: Tracy Meyer  Procedure(s) Performed: ANTERIOR LUMBAR INTERBODY FUSION LUMBAR 5 - SACRUM 1 WITH INSTRUMENTATION AND ALLOGRAFT (N/A ) ABDOMINAL EXPOSURE (N/A )     Patient location during evaluation: PACU Anesthesia Type: General Level of consciousness: awake and alert Pain management: pain level controlled Vital Signs Assessment: post-procedure vital signs reviewed and stable Respiratory status: spontaneous breathing, nonlabored ventilation, respiratory function stable and patient connected to nasal cannula oxygen Cardiovascular status: blood pressure returned to baseline and stable Postop Assessment: no apparent nausea or vomiting Anesthetic complications: no    Last Vitals:  Vitals:   06/25/18 1728 06/25/18 1900  BP: 121/68   Pulse: 68   Resp: (!) 21   Temp: 36.4 C 36.8 C  SpO2: 99%     Last Pain:  Vitals:   06/25/18 1900  TempSrc: Oral  PainSc:                  Aamilah Augenstein P Shayn Madole

## 2018-06-25 NOTE — Anesthesia Procedure Notes (Signed)
Arterial Line Insertion Start/End6/03/2018 8:05 AM, 06/25/2018 8:11 AM Performed by: Neldon Newport, CRNA  Patient location: Pre-op. Preanesthetic checklist: patient identified, IV checked, site marked, risks and benefits discussed, surgical consent, monitors and equipment checked, pre-op evaluation, timeout performed and anesthesia consent Right, radial was placed Catheter size: 20 G Hand hygiene performed  and maximum sterile barriers used  Allen's test indicative of satisfactory collateral circulation Attempts: 1 Procedure performed without using ultrasound guided technique. Following insertion, dressing applied and Biopatch. Post procedure assessment: normal  Patient tolerated the procedure well with no immediate complications.

## 2018-06-25 NOTE — H&P (Signed)
PREOPERATIVE H&P  Chief Complaint: Low back pain, bilateral leg pain  HPI: Tracy Meyer is a 53 y.o. female who presents with ongoing pain in the bilateral legs and low back  MRI reveals severe L5/S1 DDD with NF stenosis bilaterally at L5/S1  Patient has failed multiple forms of conservative care and continues to have pain (see office notes for additional details regarding the patient's full course of treatment)  No past medical history on file. Past Surgical History:  Procedure Laterality Date  . BREAST BIOPSY  53 years old   cyst removal  . CHOLECYSTECTOMY     Social History   Socioeconomic History  . Marital status: Single    Spouse name: Not on file  . Number of children: Not on file  . Years of education: Not on file  . Highest education level: Not on file  Occupational History  . Not on file  Social Needs  . Financial resource strain: Not on file  . Food insecurity:    Worry: Not on file    Inability: Not on file  . Transportation needs:    Medical: Not on file    Non-medical: Not on file  Tobacco Use  . Smoking status: Never Smoker  . Smokeless tobacco: Never Used  Substance and Sexual Activity  . Alcohol use: No  . Drug use: No  . Sexual activity: Never    Birth control/protection: None  Lifestyle  . Physical activity:    Days per week: Not on file    Minutes per session: Not on file  . Stress: Not on file  Relationships  . Social connections:    Talks on phone: Not on file    Gets together: Not on file    Attends religious service: Not on file    Active member of club or organization: Not on file    Attends meetings of clubs or organizations: Not on file    Relationship status: Not on file  Other Topics Concern  . Not on file  Social History Narrative  . Not on file   Family History  Problem Relation Age of Onset  . Colon cancer Maternal Grandmother   . Brain cancer Maternal Grandmother    Allergies  Allergen Reactions  .  Shellfish Allergy Anaphylaxis   Prior to Admission medications   Medication Sig Start Date End Date Taking? Authorizing Provider  Aspirin-Acetaminophen-Caffeine (GOODY HEADACHE PO) Take 1-2 packets by mouth daily as needed (headaches).   Yes [provider]  Multiple Vitamin (MULTIVITAMIN WITH MINERALS) TABS tablet Take 1 tablet by mouth daily.   Yes [provider]  cholecalciferol (VITAMIN D3) 25 MCG (1000 UT) tablet Take 1,000 Units by mouth daily.    [provider]  COD LIVER OIL PO Take 100 mg by mouth daily.    [provider]  ECHINACEA PO Take 1 tablet by mouth daily.    [provider]  ELDERBERRY PO Take 50 mg by mouth daily.    [provider]  vitamin B-12 (CYANOCOBALAMIN) 1000 MCG tablet Take 1,000 mcg by mouth daily.    [provider]  vitamin E 100 UNIT capsule Take by mouth daily.    [provider]     All other systems have been reviewed and were otherwise negative with the exception of those mentioned in the HPI and as above.  Physical Exam: Vitals:   06/25/18 0659 06/25/18 0700  BP: 128/75   Pulse: 77   Resp:  20   Temp:  98.3 F (36.8 C)  SpO2: 98%     Body mass index is 45.26 kg/m.  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

## 2018-06-25 NOTE — OR Nursing (Signed)
Dr. Nyoka Cowden called stating abdominal xray is negative for foreign body.

## 2018-06-25 NOTE — Anesthesia Preprocedure Evaluation (Addendum)
Anesthesia Evaluation  Patient identified by MRN, date of birth, ID band Patient awake    Reviewed: Allergy & Precautions, NPO status , Patient's Chart, lab work & pertinent test results  History of Anesthesia Complications Negative for: history of anesthetic complications  Airway Mallampati: II  TM Distance: >3 FB Neck ROM: Full    Dental no notable dental hx.    Pulmonary neg pulmonary ROS,    Pulmonary exam normal        Cardiovascular negative cardio ROS Normal cardiovascular exam     Neuro/Psych negative neurological ROS     GI/Hepatic negative GI ROS, Neg liver ROS,   Endo/Other  Morbid obesity  Renal/GU negative Renal ROS     Musculoskeletal S/P anterior lumbar fusion on 06/25/18   Abdominal   Peds  Hematology negative hematology ROS (+)   Anesthesia Other Findings Day of surgery medications reviewed with the patient.  Reproductive/Obstetrics                           Anesthesia Physical Anesthesia Plan  ASA: III  Anesthesia Plan: General   Post-op Pain Management:    Induction: Intravenous  PONV Risk Score and Plan: 4 or greater and Treatment may vary due to age or medical condition, Ondansetron, Dexamethasone and Midazolam  Airway Management Planned: Oral ETT  Additional Equipment:   Intra-op Plan:   Post-operative Plan: Extubation in OR  Informed Consent: I have reviewed the patients History and Physical, chart, labs and discussed the procedure including the risks, benefits and alternatives for the proposed anesthesia with the patient or authorized representative who has indicated his/her understanding and acceptance.     Dental advisory given  Plan Discussed with: CRNA  Anesthesia Plan Comments:        Anesthesia Quick Evaluation

## 2018-06-25 NOTE — Transfer of Care (Signed)
Immediate Anesthesia Transfer of Care Note  Patient: Tracy Meyer  Procedure(s) Performed: ANTERIOR LUMBAR INTERBODY FUSION LUMBAR 5 - SACRUM 1 WITH INSTRUMENTATION AND ALLOGRAFT (N/A ) ABDOMINAL EXPOSURE (N/A )  Patient Location: PACU  Anesthesia Type:General  Level of Consciousness: drowsy, patient cooperative and responds to stimulation  Airway & Oxygen Therapy: Patient Spontanous Breathing and Patient connected to face mask oxygen  Post-op Assessment: Report given to RN, Post -op Vital signs reviewed and stable and Patient moving all extremities X 4  Post vital signs: Reviewed and stable  Last Vitals:  Vitals Value Taken Time  BP 101/84 06/25/2018  1:15 PM  Temp    Pulse 91 06/25/2018  1:17 PM  Resp 24 06/25/2018  1:17 PM  SpO2 100 % 06/25/2018  1:17 PM  Vitals shown include unvalidated device data.  Last Pain:  Vitals:   06/25/18 0736  TempSrc:   PainSc: 5       Patients Stated Pain Goal: 3 (46/27/03 5009)  Complications: No apparent anesthesia complications

## 2018-06-25 NOTE — Anesthesia Preprocedure Evaluation (Addendum)
Anesthesia Evaluation  Patient identified by MRN, date of birth, ID band Patient awake    Reviewed: Allergy & Precautions, NPO status , Patient's Chart, lab work & pertinent test results  Airway Mallampati: III  TM Distance: >3 FB Neck ROM: Full    Dental no notable dental hx.    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  Reproductive/Obstetrics                            Anesthesia Physical Anesthesia Plan  ASA: III  Anesthesia Plan: General   Post-op Pain Management:    Induction: Intravenous  PONV Risk Score and Plan: 4 or greater and Ondansetron, Midazolam and Treatment may vary due to age or medical condition  Airway Management Planned: Oral ETT  Additional Equipment: Arterial line  Intra-op Plan:   Post-operative Plan: Extubation in OR  Informed Consent: I have reviewed the patients History and Physical, chart, labs and discussed the procedure including the risks, benefits and alternatives for the proposed anesthesia with the patient or authorized representative who has indicated his/her understanding and acceptance.     Dental advisory given  Plan Discussed with: CRNA  Anesthesia Plan Comments:      Anesthesia Quick Evaluation

## 2018-06-25 NOTE — Op Note (Signed)
DATE OF PROCEDURE:  06/25/2018                              OPERATIVE REPORT   PREOPERATIVE DIAGNOSES: 1.  Severe degenerative disc disease, L5-S1 2.  Ongoing severe axial low back pain  3.  Ongoing bilateral leg pain with prominent neuroforaminal narrowing at L5-S1  POSTOPERATIVE DIAGNOSES: 1.  Severe degenerative disc disease, L5-S1 2.  Ongoing severe axial low back pain  3.  Ongoing bilateral leg pain with prominent neuroforaminal narrowing at L5-S1  PROCEDURE: 1. Anterior lumbar interbody fusion, L5-S1 (expsure peformed by Dr. Sherren Mocha Early) 2. Insertion of interbody device x1 (Syncage intervertebral spacer, 77mm, medium, 10 degree lordotic). 3. Placement of anterior instrumentation securing the L5-S1 level. 4. Intraoperative use of fluoroscopy. 5. Use of morselized allograft - Vivigen 6. 1st assistant to Dr. Sherren Mocha Early for retroperitoneal exposure  SURGEON:  Phylliss Bob, MD  ASSISTANT:  Pricilla Holm, PA-C  ANESTHESIA:  General endotracheal anesthesia.  COMPLICATIONS:  None.  DISPOSITION:  Stable.  ESTIMATED BLOOD LOSS:  minimal  INDICATIONS FOR SURGERY:  Briefly, Tracy Meyer is a very pleasant 53 year old female, who has been having progressive debilitating pain in the back and bilateral legs.    The patient was noted to have severe degenerative disc disease at L5-S1, as well as prominent bilateral neuroforaminal narrowing at L5-S1, which I did feel was resulting in her ongoing symptoms.  I did treat her conservatively for almost 3 years, but she did continue to have significant pain. Given her ongoing pain and dysfunction, we did discuss proceeding with the procedure noted above.  The patient was fully aware of the risks and limitations associated with surgery, and did wish to proceed.  The plan was to proceed with stage I of her procedure today, and to return tomorrow for stage II, specifically, a posterior fusion procedure.   OPERATIVE DETAILS:  On 06/25/2018, the  patient was brought to surgery and general endotracheal anesthesia was administered.  The patient was placed supine on the hospital bed.  The patient's abdomen was prepped and draped in the usual sterile fashion.  An anterior retroperitoneal approach was then performed by Dr. Curt Jews.  I did function as his first assistant during the approach.  Once the anterior lumbar spine was noted, we did focus our attention on the L5-S1 intervertebral space.  I then performed a thorough and complete L5-S1 intervertebral diskectomy to the level of the posterior longitudinal ligament.  I was very pleased with the diskectomy that I was able to accomplish. The endplates were then appropriately prepared and the appropriate sized anterior intervertebral spacer was packed with Vivigen and tamped into position. I was very pleased with the press-fit of the implant.  I then proceeded with placement of anterior instrumentation at the L5-S1 intervertebral space.  To accomplish this, I did place a 60mm anterior lumbar plate over the anterior aspect of the lumbar spine, overlying the L5-S1 intervertebral space.  24 mm vertebral body screws were placed into the L5 and S1 vertebral bodies, 2 at each level.  The screws were then locked to the plate using the cam locking mechanism. I did liberally use AP and lateral fluoroscopy to ensure that the implant and anterior fixation was in the appropriate position, and was very pleased with the radiographs. The wound was copiously irrigated.  The fascia was  closed using #1 PDS.  The subcutaneous layer was closed using 0 Vicryl  followed by 2-0 Vicryl, and the skin was closed using 4-0 Monocryl. Benzoin and Steri-Strips were applied followed by sterile dressing.   All instrument counts were correct at the termination of the procedure.  Of note, Pricilla Holm was my assistant throughout surgery, and did aid in retraction, suctioning, and closure for both the anterior  and posterior portions of the procedure.   Phylliss Bob, MD

## 2018-06-25 NOTE — Op Note (Signed)
    OPERATIVE REPORT  DATE OF SURGERY: 06/25/2018  PATIENT: Tracy Meyer, 53 y.o. female MRN: [id number]  DOB: 08-May-1965  PRE-OPERATIVE DIAGNOSIS: Degenerative disc disease  POST-OPERATIVE DIAGNOSIS:  Same  PROCEDURE: Anterior exposure for L5-S1 disc fusion  SURGEON:  Curt Jews, M.D.  Co-surgeon for the exposure Dr. Phylliss Bob  ANESTHESIA: General  EBL: per anesthesia record  Total I/O In: 4235 [I.V.:2000; IV Piggyback:250] Out: 250 [Urine:175; Blood:75]  BLOOD ADMINISTERED: none  DRAINS: none  SPECIMEN: none  COUNTS CORRECT:  YES  PATIENT DISPOSITION:  PACU - hemodynamically stable  PROCEDURE DETAILS: The patient was taken to the operating placed supine position where the area of the abdomen was prepped and draped in usual sterile fashion.  C-arm was brought into the field and a crosstable lateral projection was used to identify the level of the L5-S1 disc on the skin and this was marked.  The abdomen was prepped and draped in usual sterile fashion.  The patient is morbidly obese.  Incision was made from the midline to the left and carried down through the subcutaneous fat.  Fat was mobilized off the anterior rectus sheath and the anterior rectus sheath was opened in line with the skin incision with electrocautery.  The rectus muscle was mobilized circumferentially.  The retroperitoneal space was entered bluntly in the left lower quadrant and the intraperitoneal contents were mobilized to the right.  Blunt dissection was continued over the level of the iliac vessels.  The posterior rectus sheath was opened laterally.  The left ureter was identified and mobilized to the right.  Dissection was tented to the level of the disc and blunt dissection over the L5-S1 disc between the level of the iliac vessels was continued.  Middle sacral vessels were clipped and divided.  Kitner dissector was used to continue to mobilize tissue off the L5-S1 disc superiorly and inferiorly and also  medial and laterally.  The Thompson retractor was brought onto the field and the reverse lip 200 blades were positioned to the right and left of the L5-S1 disc.  190 malleable's were used for superior and inferior retraction.  A needle was placed in the L5-S1 disc and C-arm was brought back onto the field to confirm this was the appropriate level.  The remainder of the procedure will be dictated as a separate note with Dr. Levora Dredge, M.D., [facility] 06/25/2018 1:56 PM

## 2018-06-26 ENCOUNTER — Inpatient Hospital Stay (HOSPITAL_COMMUNITY): Payer: Managed Care, Other (non HMO) | Admitting: Certified Registered"

## 2018-06-26 ENCOUNTER — Inpatient Hospital Stay (HOSPITAL_COMMUNITY): Payer: Managed Care, Other (non HMO)

## 2018-06-26 ENCOUNTER — Encounter (HOSPITAL_COMMUNITY): Payer: Self-pay | Admitting: Certified Registered"

## 2018-06-26 ENCOUNTER — Encounter (HOSPITAL_COMMUNITY)
Admission: RE | Disposition: A | Discharge status: Home Health | Payer: Self-pay | Source: Home / Self Care | Attending: Orthopedic Surgery

## 2018-06-26 ENCOUNTER — Inpatient Hospital Stay (HOSPITAL_COMMUNITY)
Admission: RE | Admit: 2018-06-26 | Payer: Managed Care, Other (non HMO) | Source: Home / Self Care | Admitting: Orthopedic Surgery

## 2018-06-26 SURGERY — POSTERIOR LUMBAR FUSION 2 LEVEL
Anesthesia: General

## 2018-06-26 MED ORDER — ONDANSETRON HCL 4 MG/2ML IJ SOLN
INTRAMUSCULAR | Status: DC | PRN
  Administered 2018-06-26: 4 mg via INTRAVENOUS

## 2018-06-26 MED ORDER — PROMETHAZINE HCL 25 MG/ML IJ SOLN: 6.2500 mg | INTRAMUSCULAR | Status: DC | PRN

## 2018-06-26 MED ORDER — SUFENTANIL CITRATE 50 MCG/ML IV SOLN
INTRAVENOUS | Status: DC | PRN
  Administered 2018-06-26: 10 ug via INTRAVENOUS

## 2018-06-26 MED ORDER — 0.9 % SODIUM CHLORIDE (POUR BTL) OPTIME
TOPICAL | Status: DC | PRN
  Administered 2018-06-26: 1000 mL

## 2018-06-26 MED ORDER — SODIUM CHLORIDE (PF) 0.9 % IJ SOLN
INTRAMUSCULAR | Status: AC
  Filled 2018-06-26: qty 10

## 2018-06-26 MED ORDER — CEFAZOLIN SODIUM-DEXTROSE 1-4 GM/50ML-% IV SOLN
INTRAVENOUS | Status: AC
  Filled 2018-06-26: qty 50

## 2018-06-26 MED ORDER — MIDAZOLAM HCL 2 MG/2ML IJ SOLN
INTRAMUSCULAR | Status: AC
  Filled 2018-06-26: qty 2

## 2018-06-26 MED ORDER — BUPIVACAINE LIPOSOME 1.3 % IJ SUSP
20.0000 mL | INTRAMUSCULAR | Status: AC
  Administered 2018-06-26: 08:00:00 20 mL
  Filled 2018-06-26: qty 20

## 2018-06-26 MED ORDER — SUFENTANIL CITRATE 50 MCG/ML IV SOLN
INTRAVENOUS | Status: AC
  Filled 2018-06-26: qty 1

## 2018-06-26 MED ORDER — DEXAMETHASONE SODIUM PHOSPHATE 10 MG/ML IJ SOLN
INTRAMUSCULAR | Status: AC
  Filled 2018-06-26: qty 1

## 2018-06-26 MED ORDER — ONDANSETRON HCL 4 MG/2ML IJ SOLN
INTRAMUSCULAR | Status: AC
  Filled 2018-06-26: qty 2

## 2018-06-26 MED ORDER — PROPOFOL 500 MG/50ML IV EMUL
INTRAVENOUS | Status: DC | PRN
  Administered 2018-06-26: 50 ug/kg/min via INTRAVENOUS

## 2018-06-26 MED ORDER — SUCCINYLCHOLINE CHLORIDE 200 MG/10ML IV SOSY
PREFILLED_SYRINGE | INTRAVENOUS | Status: AC
  Filled 2018-06-26: qty 10

## 2018-06-26 MED ORDER — LIDOCAINE 2% (20 MG/ML) 5 ML SYRINGE
INTRAMUSCULAR | Status: DC | PRN
  Administered 2018-06-26: 100 mg via INTRAVENOUS

## 2018-06-26 MED ORDER — CEFAZOLIN SODIUM-DEXTROSE 2-4 GM/100ML-% IV SOLN
INTRAVENOUS | Status: AC
  Filled 2018-06-26: qty 100

## 2018-06-26 MED ORDER — PROPOFOL 10 MG/ML IV BOLUS
INTRAVENOUS | Status: DC | PRN
  Administered 2018-06-26: 200 mg via INTRAVENOUS

## 2018-06-26 MED ORDER — OXYCODONE HCL 5 MG PO TABS: 5.0000 mg | ORAL_TABLET | Freq: Once | ORAL | Status: DC | PRN

## 2018-06-26 MED ORDER — METHYLENE BLUE 0.5 % INJ SOLN: INTRAVENOUS | Status: DC | PRN

## 2018-06-26 MED ORDER — BUPIVACAINE LIPOSOME 1.3 % IJ SUSP: INTRAMUSCULAR | Status: DC | PRN

## 2018-06-26 MED ORDER — MIDAZOLAM HCL 5 MG/5ML IJ SOLN
INTRAMUSCULAR | Status: DC | PRN
  Administered 2018-06-26: 2 mg via INTRAVENOUS

## 2018-06-26 MED ORDER — ROCURONIUM BROMIDE 10 MG/ML (PF) SYRINGE
PREFILLED_SYRINGE | INTRAVENOUS | Status: DC | PRN
  Administered 2018-06-26: 10 mg via INTRAVENOUS
  Administered 2018-06-26: 40 mg via INTRAVENOUS

## 2018-06-26 MED ORDER — PROPOFOL 10 MG/ML IV BOLUS
INTRAVENOUS | Status: AC
  Filled 2018-06-26: qty 20

## 2018-06-26 MED ORDER — OXYCODONE HCL 5 MG/5ML PO SOLN: 5.0000 mg | Freq: Once | ORAL | Status: DC | PRN

## 2018-06-26 MED ORDER — ACETAMINOPHEN 10 MG/ML IV SOLN: 1000.0000 mg | Freq: Once | INTRAVENOUS | Status: DC | PRN

## 2018-06-26 MED ORDER — BUPIVACAINE-EPINEPHRINE 0.25% -1:200000 IJ SOLN
INTRAMUSCULAR | Status: DC | PRN
  Administered 2018-06-26: 30 mL

## 2018-06-26 MED ORDER — DEXTROSE 5 % IV SOLN
INTRAVENOUS | Status: DC | PRN
  Administered 2018-06-26: 3 g via INTRAVENOUS

## 2018-06-26 MED ORDER — LIDOCAINE 2% (20 MG/ML) 5 ML SYRINGE
INTRAMUSCULAR | Status: AC
  Filled 2018-06-26: qty 5

## 2018-06-26 MED ORDER — HYDROMORPHONE HCL 1 MG/ML IJ SOLN: 0.2500 mg | INTRAMUSCULAR | Status: DC | PRN

## 2018-06-26 MED ORDER — LACTATED RINGERS IV SOLN
INTRAVENOUS | Status: DC | PRN
  Administered 2018-06-26: 07:00:00 via INTRAVENOUS

## 2018-06-26 MED ORDER — SUGAMMADEX SODIUM 200 MG/2ML IV SOLN
INTRAVENOUS | Status: DC | PRN
  Administered 2018-06-26: 100 mg via INTRAVENOUS

## 2018-06-26 MED ORDER — ROCURONIUM BROMIDE 10 MG/ML (PF) SYRINGE
PREFILLED_SYRINGE | INTRAVENOUS | Status: AC
  Filled 2018-06-26: qty 10

## 2018-06-26 MED ORDER — DEXAMETHASONE SODIUM PHOSPHATE 10 MG/ML IJ SOLN
INTRAMUSCULAR | Status: DC | PRN
  Administered 2018-06-26: 5 mg via INTRAVENOUS

## 2018-06-26 MED ORDER — THROMBIN 20000 UNITS EX SOLR
CUTANEOUS | Status: DC | PRN
  Administered 2018-06-26: 20000 [IU] via TOPICAL

## 2018-06-26 MED ORDER — SODIUM CHLORIDE 0.9 % IV SOLN
INTRAVENOUS | Status: DC | PRN
  Administered 2018-06-26: 50 ug/min via INTRAVENOUS

## 2018-06-26 MED ORDER — SUCCINYLCHOLINE CHLORIDE 20 MG/ML IJ SOLN
INTRAMUSCULAR | Status: DC | PRN
  Administered 2018-06-26: 140 mg via INTRAVENOUS

## 2018-06-26 SURGICAL SUPPLY — 90 items
APL SKNCLS STERI-STRIP NONHPOA (GAUZE/BANDAGES/DRESSINGS) ×1
BENZOIN TINCTURE PRP APPL 2/3 (GAUZE/BANDAGES/DRESSINGS) ×1 IMPLANT
BLADE CLIPPER SURG (BLADE) IMPLANT
BONE VIVIGEN FORMABLE 1.3CC (Bone Implant) ×2 IMPLANT
BUR PRESCISION 1.7 ELITE (BURR) IMPLANT
BUR ROUND PRECISION 4.0 (BURR) IMPLANT
BUR SABER RD CUTTING 3.0 (BURR) IMPLANT
CARTRIDGE OIL MAESTRO DRILL (MISCELLANEOUS) ×2 IMPLANT
CLSR STERI-STRIP ANTIMIC 1/2X4 (GAUZE/BANDAGES/DRESSINGS) ×1 IMPLANT
CONT SPEC 4OZ CLIKSEAL STRL BL (MISCELLANEOUS) ×2 IMPLANT
COVER SURGICAL LIGHT HANDLE (MISCELLANEOUS) ×2 IMPLANT
COVER WAND RF STERILE (DRAPES) ×2 IMPLANT
DIFFUSER DRILL AIR PNEUMATIC (MISCELLANEOUS) ×4 IMPLANT
DRAIN CHANNEL 15F RND FF W/TCR (WOUND CARE) ×2 IMPLANT
DRAPE C-ARM 42X72 X-RAY (DRAPES) ×2 IMPLANT
DRAPE C-ARMOR (DRAPES) ×1 IMPLANT
DRAPE POUCH INSTRU U-SHP 10X18 (DRAPES) ×2 IMPLANT
DRAPE SURG 17X23 STRL (DRAPES) ×8 IMPLANT
DRSG MEPILEX BORDER 4X12 (GAUZE/BANDAGES/DRESSINGS) IMPLANT
DRSG MEPILEX BORDER 4X8 (GAUZE/BANDAGES/DRESSINGS) IMPLANT
DURAPREP 26ML APPLICATOR (WOUND CARE) ×2 IMPLANT
ELECT BLADE 4.0 EZ CLEAN MEGAD (MISCELLANEOUS) ×2
ELECT CAUTERY BLADE 6.4 (BLADE) ×4 IMPLANT
ELECT REM PT RETURN 9FT ADLT (ELECTROSURGICAL) ×2
ELECTRODE BLDE 4.0 EZ CLN MEGD (MISCELLANEOUS) ×1 IMPLANT
ELECTRODE REM PT RTRN 9FT ADLT (ELECTROSURGICAL) ×1 IMPLANT
EVACUATOR SILICONE 100CC (DRAIN) ×2 IMPLANT
FEE INTRAOP MONITOR IMPULS NCS (MISCELLANEOUS) IMPLANT
GAUZE 4X4 16PLY RFD (DISPOSABLE) ×4 IMPLANT
GAUZE SPONGE 4X4 12PLY STRL (GAUZE/BANDAGES/DRESSINGS) ×2 IMPLANT
GLOVE BIO SURGEON STRL SZ7 (GLOVE) ×2 IMPLANT
GLOVE BIO SURGEON STRL SZ8 (GLOVE) ×2 IMPLANT
GLOVE BIOGEL PI IND STRL 7.0 (GLOVE) ×1 IMPLANT
GLOVE BIOGEL PI IND STRL 8 (GLOVE) ×1 IMPLANT
GLOVE BIOGEL PI INDICATOR 7.0 (GLOVE) ×1
GLOVE BIOGEL PI INDICATOR 8 (GLOVE) ×1
GOWN STRL REUS W/ TWL LRG LVL3 (GOWN DISPOSABLE) ×2 IMPLANT
GOWN STRL REUS W/ TWL XL LVL3 (GOWN DISPOSABLE) ×1 IMPLANT
GOWN STRL REUS W/TWL LRG LVL3 (GOWN DISPOSABLE) ×4
GOWN STRL REUS W/TWL XL LVL3 (GOWN DISPOSABLE) ×2
GRAFT BNE MATRIX VG FRMBL SM 1 (Bone Implant) IMPLANT
GUIDEWIRE BLUNT VIPER II 1.45 (WIRE) ×1 IMPLANT
GUIDEWIRE SHARP VIPER II (WIRE) ×4 IMPLANT
INTRAOP MONITOR FEE IMPULS NCS (MISCELLANEOUS) ×1
INTRAOP MONITOR FEE IMPULSE (MISCELLANEOUS) ×1
IV CATH 14GX2 1/4 (CATHETERS) ×2 IMPLANT
KIT ALARA NEURO ACCESS (KITS) ×2 IMPLANT
KIT BASIN OR (CUSTOM PROCEDURE TRAY) ×2 IMPLANT
KIT POSITION SURG JACKSON T1 (MISCELLANEOUS) ×2 IMPLANT
KIT TURNOVER KIT B (KITS) ×2 IMPLANT
MARKER SKIN DUAL TIP RULER LAB (MISCELLANEOUS) ×2 IMPLANT
NDL HYPO 25GX1X1/2 BEV (NEEDLE) ×1 IMPLANT
NDL SPNL 18GX3.5 QUINCKE PK (NEEDLE) ×2 IMPLANT
NEEDLE HYPO 25GX1X1/2 BEV (NEEDLE) ×2 IMPLANT
NEEDLE SPNL 18GX3.5 QUINCKE PK (NEEDLE) ×4 IMPLANT
NS IRRIG 1000ML POUR BTL (IV SOLUTION) ×2 IMPLANT
OIL CARTRIDGE MAESTRO DRILL (MISCELLANEOUS) ×4
PACK LAMINECTOMY ORTHO (CUSTOM PROCEDURE TRAY) ×2 IMPLANT
PACK UNIVERSAL I (CUSTOM PROCEDURE TRAY) ×2 IMPLANT
PAD ARMBOARD 7.5X6 YLW CONV (MISCELLANEOUS) ×4 IMPLANT
PATTIES SURGICAL .5 X1 (DISPOSABLE) ×2 IMPLANT
PATTIES SURGICAL .5 X3 (DISPOSABLE) IMPLANT
PATTIES SURGICAL .5X1.5 (GAUZE/BANDAGES/DRESSINGS) ×2 IMPLANT
PATTIES SURGICAL .75X.75 (GAUZE/BANDAGES/DRESSINGS) ×2 IMPLANT
PROBE PEDCLE PROBE MAGSTM DISP (MISCELLANEOUS) ×1 IMPLANT
ROD VIPER II LORDOSED 5.5X40 (Rod) ×2 IMPLANT
SCREW SET SINGLE INNER MIS (Screw) ×4 IMPLANT
SCREW XTAB POLY VIPER  7X45 (Screw) ×4 IMPLANT
SCREW XTAB POLY VIPER 7X45 (Screw) IMPLANT
SPONGE INTESTINAL PEANUT (DISPOSABLE) IMPLANT
SPONGE SURGIFOAM ABS GEL 100 (HEMOSTASIS) IMPLANT
STRIP CLOSURE SKIN 1/2X4 (GAUZE/BANDAGES/DRESSINGS) IMPLANT
SURGIFLO W/THROMBIN 8M KIT (HEMOSTASIS) IMPLANT
SUT MNCRL AB 4-0 PS2 18 (SUTURE) ×2 IMPLANT
SUT VIC AB 0 CT1 18XCR BRD 8 (SUTURE) ×2 IMPLANT
SUT VIC AB 0 CT1 8-18 (SUTURE) ×4
SUT VIC AB 1 CT1 18XCR BRD 8 (SUTURE) ×2 IMPLANT
SUT VIC AB 1 CT1 8-18 (SUTURE) ×4
SUT VIC AB 2-0 CT2 18 VCP726D (SUTURE) ×4 IMPLANT
SYR 20CC LL (SYRINGE) ×2 IMPLANT
SYR BULB IRRIGATION 50ML (SYRINGE) ×2 IMPLANT
SYR CONTROL 10ML LL (SYRINGE) ×4 IMPLANT
SYR TB 1ML LUER SLIP (SYRINGE) ×2 IMPLANT
TAP CANN VIPER2 DL 6.0 (TAP) ×2 IMPLANT
TAPE CLOTH SURG 6X10 WHT LF (GAUZE/BANDAGES/DRESSINGS) ×1 IMPLANT
TOWEL OR 17X24 6PK STRL BLUE (TOWEL DISPOSABLE) ×2 IMPLANT
TOWEL OR 17X26 10 PK STRL BLUE (TOWEL DISPOSABLE) ×2 IMPLANT
TRAY FOLEY MTR SLVR 16FR STAT (SET/KITS/TRAYS/PACK) ×2 IMPLANT
WATER STERILE IRR 1000ML POUR (IV SOLUTION) ×2 IMPLANT
YANKAUER SUCT BULB TIP NO VENT (SUCTIONS) ×2 IMPLANT

## 2018-06-26 NOTE — H&P (Signed)
Patient tolerated stage 1 of her procedure well. Will proceed with stage 2 today, as scheduled.

## 2018-06-26 NOTE — Progress Notes (Signed)
Orthopedic Tech Progress Note Patient Details:  Tracy Meyer 1965-06-29 [id number]  Ortho Devices Ortho Device/Splint Location: Called [employer] for TLSO brace.       [name] 06/26/2018, 10:38 AM

## 2018-06-26 NOTE — Anesthesia Procedure Notes (Signed)
Procedure Name: Intubation Date/Time: 06/26/2018 7:39 AM Performed by: Moshe Salisbury, CRNA Pre-anesthesia Checklist: Patient identified, Emergency Drugs available, Suction available and Patient being monitored Patient Re-evaluated:Patient Re-evaluated prior to induction Oxygen Delivery Method: Circle System Utilized Preoxygenation: Pre-oxygenation with 100% oxygen Induction Type: IV induction Ventilation: Mask ventilation without difficulty Laryngoscope Size: Mac and 3 Grade View: Grade II Tube type: Oral Tube size: 7.5 mm Number of attempts: 1 Airway Equipment and Method: Stylet Placement Confirmation: ETT inserted through vocal cords under direct vision,  positive ETCO2 and breath sounds checked- equal and bilateral Secured at: 21 cm Tube secured with: Tape Dental Injury: Teeth and Oropharynx as per pre-operative assessment

## 2018-06-26 NOTE — Anesthesia Postprocedure Evaluation (Signed)
Anesthesia Post Note  Patient: Tracy Meyer  Procedure(s) Performed: LUMBAR 5 - SACRUM 1 POSTERIOR SPINAL FUSION WITH INSTRUMENTATION AND ALLOGRAFT (N/A )     Patient location during evaluation: PACU Anesthesia Type: General Level of consciousness: awake and alert Pain management: pain level controlled Vital Signs Assessment: post-procedure vital signs reviewed and stable Respiratory status: spontaneous breathing, nonlabored ventilation and respiratory function stable Cardiovascular status: blood pressure returned to baseline and stable Postop Assessment: no apparent nausea or vomiting Anesthetic complications: no    Last Vitals:  Vitals:   06/26/18 0953 06/26/18 1019  BP: (!) 123/58 134/73  Pulse:  88  Resp:  (!) 27  Temp: 37.2 C 37.1 C  SpO2:  94%    Last Pain:  Vitals:   06/26/18 1019  TempSrc: Oral  PainSc: 0-No pain    LLE Motor Response: (P) Purposeful movement (06/26/18 1015)   RLE Motor Response: (P) Purposeful movement (06/26/18 1015)        Brennan Bailey

## 2018-06-26 NOTE — Transfer of Care (Signed)
Immediate Anesthesia Transfer of Care Note  Patient: Tracy Meyer  Procedure(s) Performed: LUMBAR 5 - SACRUM 1 POSTERIOR SPINAL FUSION WITH INSTRUMENTATION AND ALLOGRAFT (N/A )  Patient Location: PACU  Anesthesia Type:General  Level of Consciousness: drowsy and patient cooperative  Airway & Oxygen Therapy: Patient Spontanous Breathing and Patient connected to nasal cannula oxygen  Post-op Assessment: Report given to RN, Post -op Vital signs reviewed and stable and Patient moving all extremities  Post vital signs: Reviewed and stable  Last Vitals:  Vitals Value Taken Time  BP 123/58 06/26/2018  9:53 AM  Temp    Pulse 93 06/26/2018  9:54 AM  Resp 13 06/26/2018  9:54 AM  SpO2 100 % 06/26/2018  9:54 AM  Vitals shown include unvalidated device data.  Last Pain:  Vitals:   06/26/18 0618  TempSrc:   PainSc: 0-No pain      Patients Stated Pain Goal: 3 (61/68/37 2902)  Complications: No apparent anesthesia complications

## 2018-06-26 NOTE — Op Note (Signed)
PATIENT NAME: Tracy Meyer   MEDICAL RECORD NO.:   [id number]    PHYSICIAN:  Phylliss Bob, MD      DATE OF BIRTH: 11-Dec-1965   DATE OF PROCEDURE: 06/26/2018                              OPERATIVE REPORT   PREOPERATIVE DIAGNOSES: 1.  L5/S1 DDD 2.  Bilateral lumbar radiculopathy. 3.  Low back pain. 4.  S/p L5/S1 Anterior Lumbar fusion requiring posterior fusion and instrumentation  POSTOPERATIVE DIAGNOSES:   1.  L5/S1 DDD 2.  Bilateral lumbar radiculopathy. 3.  Low back pain. 4.  S/p L5/S1 Anterior Lumbar fusion requiring posterior fusion and instrumentation   PROCEDURE (Stage 2): 1. Posterior spinal fusion, L5-S1. 2. Placement of posterior segmental instrumentation, L5, S1. 3. Use of morselized allograft-Vivagen. 4. Intraoperative use of floroscopy  SURGEON:  Phylliss Bob, MD  ASSISTANT:  Pricilla Holm, PA-C  ANESTHESIA:  General endotracheal anesthesia.  COMPLICATIONS:  None.  DISPOSITION:  Stable.  ESTIMATED BLOOD LOSS:  Minimal  INDICATIONS FOR SURGERY: Briefly, Ms. Downs is one day status post an anterior lumbar fusion at the L5-S1 level.  Please refer to my operative report dated 06/25/2018, for a full account of the patient's preoperative history and indications for surgery.  The patient did present today for stage 2 of what was to be a 2-staged procedure.  OPERATIVE DETAILS:  On 06/26/2018, the patient was brought to surgery and general endotracheal anesthesia was administered.  The patient was placed prone onto a Jackson spinal bed.  The back was then prepped and draped in the usual sterile fashion.  I then made paramedian incisions on the right and left sides, just lateral to the lateral borders of the pedicles from L5-S1.  On the left side, the posterolateral gutter and posterior elements were identified and exposed and decorticated.  Vivigen was packed into the posterolateral gutter on the left side to aid in the success of the posterior fusion.  I then  tapped the L5, and S1 pedicles bilaterally using a 6 mm tap.  Of note, I did use neurologic monitoring and I did test each of the taps using triggered EMG.  There was no tap that tested below 12 milliamps.  I then placed 7 x 45 mm screws bilaterally at S1 and 7 x 45 mm screws bilaterally at L5.  Rods were then secured into the tulip heads of the screws bilaterally.  Caps were then placed and a final locking procedure was performed.  I was very pleased with the final AP and lateral fluoroscopic images.  The wound was then copiously irrigated.  On the right and left sides, the fascia was closed using #1 Vicryl.  The subcutaneous layer was closed using 0 Vicryl followed by 2-0 Vicryl, and the skin was then closed using 4-0 Monocryl. Benzoin and Steri-Strips were applied followed by sterile dressing.  All instrument counts were correct at the termination of the procedure. Again, I did use neurologic monitoring throughout the surgery, and there was no abnormal EMG activity noted throughout the surgery.  Of note, Pricilla Holm was my assistant throughout surgery, and did aid in retraction, suctioning, and closure for both the anterior and posterior portions of the procedure.     Phylliss Bob, MD

## 2018-06-27 MED ORDER — DIAZEPAM 5 MG PO TABS: 5.0000 mg | ORAL_TABLET | Freq: Four times a day (QID) | ORAL | 0 refills | Status: DC | PRN

## 2018-06-27 MED ORDER — OXYCODONE-ACETAMINOPHEN 5-325 MG PO TABS: 1.0000 | ORAL_TABLET | ORAL | 0 refills | Status: DC | PRN

## 2018-06-27 MED FILL — Thrombin (Recombinant) For Soln 20000 Unit: CUTANEOUS | Qty: 1 | Status: AC

## 2018-06-27 NOTE — Progress Notes (Signed)
Patient ID: Tracy Meyer, female   DOB: [birth year], 53 y.o.   MRN: [id number] Doing well.  Reports abdominal and back soreness but tolerable.  Potential discharge today.  No nausea or vomiting

## 2018-06-27 NOTE — Progress Notes (Signed)
    Patient doing well  Minimal back pain Reports intermittent right leg ache, much improved vs yesterday AM Has been ambualting   Physical Exam: Vitals:   06/27/18 0032 06/27/18 0331  BP: (!) 153/80 (!) 139/58  Pulse: (!) 115 91  Resp: 18 20  Temp: (!) 100.8 F (38.2 C) 99.2 F (37.3 C)  SpO2:  96%    Dressing in place NVI  POD s/p A/P lumbar fusion, doing well  - up with PT/OT, encourage ambulation - Percocet for pain, Valium for muscle spasms - d/c home today with f/u in 2 weeks

## 2018-06-27 NOTE — Evaluation (Signed)
Occupational Therapy Evaluation and Discharge Patient Details Name: Tracy Meyer MRN: [id number] DOB: 10/05/1965 Today's Date: 06/27/2018    History of Present Illness Patient is a 53 y/o female s/p A/P L5-S1 fusion on 6/3-6/4. No significant PMH on file.    Clinical Impression   This 53 yo female admitted with above presents to acute OT with all education completed, we will D/C from acute OT.     Follow Up Recommendations  No OT follow up;Supervision - Intermittent    Equipment Recommendations  3 in 1 bedside commode       Precautions / Restrictions Precautions Precautions: Back Precaution Booklet Issued: Yes (comment) Required Braces or Orthoses: Spinal Brace Spinal Brace: Thoracolumbosacral orthotic;Applied in sitting position Restrictions Weight Bearing Restrictions: No      Mobility Bed Mobility Overal bed mobility: Needs Assistance Bed Mobility: Rolling;Sidelying to Sit Rolling: Supervision Sidelying to sit: Supervision       General bed mobility comments: supervision with bed flat and rails down - cueing for sequencing  Transfers Overall transfer level: Needs assistance Equipment used: Rolling walker (2 wheeled) Transfers: Sit to/from Stand Sit to Stand: Min guard         General transfer comment: min guard for safety with cueing for hand placement    Balance Overall balance assessment: Mild deficits observed, not formally tested                                         ADL either performed or assessed with clinical judgement   ADL Overall ADL's : Needs assistance/impaired Eating/Feeding: Independent;Sitting   Grooming: Supervision/safety;Set up;Standing;Sitting   Upper Body Bathing: Set up;Sitting   Lower Body Bathing: Minimal assistance Lower Body Bathing Details (indicate cue type and reason): min guard A sit<>stand Upper Body Dressing : Set up;Sitting   Lower Body Dressing: Set up;Supervision/safety Lower Body Dressing  Details (indicate cue type and reason): min guard A sit<>stand Toilet Transfer: Min guard;Ambulation;RW;Comfort height toilet;Grab bars   Toileting- Clothing Manipulation and Hygiene: Min guard;Sit to/from stand   Tub/ Shower Transfer: Min guard;Ambulation;Rolling walker;Tub bench     General ADL Comments: Pt will get a tub bench on her own. Pt educated on using wet wipes for back peri care, use of two cups for brushing teeth to avoid bending over the sink, having items she uses often at a good height for her.      Vision Patient Visual Report: No change from baseline              Pertinent Vitals/Pain Pain Assessment: 0-10 Pain Score: 6  Pain Location: back Pain Descriptors / Indicators: Aching;Discomfort;Guarding Pain Intervention(s): Limited activity within patient's tolerance;Monitored during session;Repositioned;Patient requesting pain meds-RN notified     Hand Dominance  right   Extremity/Trunk Assessment Upper Extremity Assessment Upper Extremity Assessment: Overall WFL for tasks assessed   Lower Extremity Assessment Lower Extremity Assessment: Generalized weakness;RLE deficits/detail RLE Deficits / Details: patient reporting numbness of R LE; mild weakness noted in weight bearing   Cervical / Trunk Assessment Cervical / Trunk Assessment: Normal   Communication Communication Communication: No difficulties   Cognition Arousal/Alertness: Awake/alert Behavior During Therapy: WFL for tasks assessed/performed Overall Cognitive Status: Within Functional Limits for tasks assessed  Home Living Family/patient expects to be discharged to:: Private residence Living Arrangements: Children Available Help at Discharge: Family;Available 24 hours/day Type of Home: House Home Access: Stairs to enter [employer] of Steps: 3 Entrance Stairs-Rails: Right;Left Home Layout: One level     Bathroom  Shower/Tub: Teacher, early years/pre: Standard     Home Equipment: None          Prior Functioning/Environment Level of Independence: Independent                 OT Problem List: Decreased range of motion;Impaired balance (sitting and/or standing);Pain;Obesity         OT Goals(Current goals can be found in the care plan section) Acute Rehab OT Goals Patient Stated Goal: home today  OT Frequency:             Co-evaluation PT/OT/SLP Co-Evaluation/Treatment: Yes Reason for Co-Treatment: For patient/therapist safety;To address functional/ADL transfers PT goals addressed during session: Mobility/safety with mobility;Balance;Proper use of DME;Strengthening/ROM OT goals addressed during session: ADL's and self-care;Strengthening/ROM      AM-PAC OT "6 Clicks" Daily Activity     Outcome Measure Help from another person eating meals?: None Help from another person taking care of personal grooming?: A Little Help from another person toileting, which includes using toliet, bedpan, or urinal?: A Little Help from another person bathing (including washing, rinsing, drying)?: A Little Help from another person to put on and taking off regular upper body clothing?: A Little Help from another person to put on and taking off regular lower body clothing?: A Little 6 Click Score: 19   End of Session Equipment Utilized During Treatment: Gait belt;Rolling walker;Back brace Nurse Communication: Mobility status(need for a 3n1 and RW)  Activity Tolerance: Patient tolerated treatment well Patient left: in chair;with call bell/phone within reach  OT Visit Diagnosis: Unsteadiness on feet (R26.81);Other abnormalities of gait and mobility (R26.89);Muscle weakness (generalized) (M62.81);Pain Pain - part of body: (incisional)                Time: 0826-0920 OT Time Calculation (min): 54 min Charges:  OT General Charges $OT Visit: 1 Visit OT Evaluation $OT Eval Moderate Complexity:  1 Mod OT Treatments $Self Care/Home Management : 8-22 mins  Tracy Meyer, OTR/L Acute [employer] Pager [phone] Office [phone]     Tracy Meyer 06/27/2018, 11:52 AM

## 2018-06-27 NOTE — Addendum Note (Signed)
Addendum  created 06/27/18 1216 by Glynda Jaeger, CRNA   Clinical Note Signed, Intraprocedure Blocks edited

## 2018-06-27 NOTE — Care Management (Addendum)
ED CM received call from [place] on 4NP concerning  Still awaiting DME delivery from [place]. CM contacted [employer] rep  Jeneen Rinks , he confirmed DME will be delivered to room prior to discharge. Updated Charge Nurse, No further CM needs.identified.  Laurena Slimmer RN, BSN  ED Nurse Care Manager [phone]

## 2018-06-27 NOTE — Care Management (Signed)
Case manager received call back from [facility] representative stating patient has been setup with [facility]. CM faxed orders for Home Health PT to [phone].     Ricki Miller, RN Case Manager [phone]

## 2018-06-27 NOTE — TOC Transition Note (Signed)
Transition of Care [facility]) - CM/SW Discharge Note   Patient Details  Name: Tracy Meyer MRN: [id number] Date of Birth: [birth year]  [facility]) CM/SW Contact:  Ninfa Meeker, RN Phone Number: [phone] (working remotely) 06/27/2018, 2:29 PM   Clinical Narrative:  53 yr old female s/p A/P L5-S1. Case manager spoke with patient concerning discharge plan and DME. Patient is insured through [employer], [place] explained process for submitting request for Home Health and equipment. Referral was called to [facility], patient Intake ID# [id number]. CM will fax demographics, orders, H&P to [facility] [phone]. Patient will be notified by the assigned Home Health agency. DME will be delivered to room by [facility] Romans, CM was informed of this by [facility] representative.     Final next level of care: [place] Barriers to Discharge: No Barriers Identified   Patient Goals and CMS Choice Patient states their goals for this hospitalization and ongoing recovery are:: (to get better)   Choice offered to / list presented to : (Pt has [employer] they arrange [facility] )  Discharge Placement                       Discharge Plan and Services   Discharge Planning Services: CM Consult Post Acute Care Choice: Durable Medical Equipment, Home Health          DME Arranged: 3-N-1, Walker rolling DME Agency: (To be determined by [facility] )       HH Arranged: PT HH Agency: (to be determined by [facility]. CM faxed orders ) Date HH Agency Contacted: 06/27/18 Time [place]: [id number]    Social Determinants of Health (SDOH) Interventions     Readmission Risk Interventions No flowsheet data found.

## 2018-06-27 NOTE — Evaluation (Signed)
Physical Therapy Evaluation Patient Details Name: Tracy Meyer MRN: [id number] DOB: Dec 16, 1965 Today's Date: 06/27/2018   History of Present Illness  Patient is a 53 y/o female s/p A/P L5-S1 fusion on 6/3-6/4. No significant PMH on file.   Clinical Impression  Patient admitted s/p above listed procedure. Patient reporting Mod I with mobility prior to admission. Patient today requiring supervision to min guard assist for functional mobility and safety. Cueing for hand placement, back precautions and safety throughout session with good carryover. Patient ambulating in hallway and up/down 2 steps with good stability and no LOB. Will continue to follow. Patient will need RW, 3in1 at discharge.      Follow Up Recommendations Home health PT;Supervision - Intermittent    Equipment Recommendations  Rolling walker with 5" wheels;3in1 (PT)    Recommendations for Other Services       Precautions / Restrictions Precautions Precautions: Back Precaution Booklet Issued: Yes (comment) Required Braces or Orthoses: Spinal Brace Spinal Brace: Thoracolumbosacral orthotic;Applied in sitting position Restrictions Weight Bearing Restrictions: No      Mobility  Bed Mobility Overal bed mobility: Needs Assistance Bed Mobility: Rolling;Sidelying to Sit Rolling: Supervision Sidelying to sit: Supervision       General bed mobility comments: supervision with bed flat and rails down - cueing for sequencing  Transfers Overall transfer level: Needs assistance Equipment used: Rolling walker (2 wheeled) Transfers: Sit to/from Stand Sit to Stand: Min guard         General transfer comment: min guard for safety with cueing for hand placement  Ambulation/Gait Ambulation/Gait assistance: Min guard Gait Distance (Feet): 200 Feet(x2) Assistive device: Rolling walker (2 wheeled) Gait Pattern/deviations: Step-through pattern;Decreased stance time - right;Decreased weight shift to right;Drifts  right/left Gait velocity: decreased   General Gait Details: mild instabiltiy - will require RW at discharge  Stairs            Wheelchair Mobility    Modified Rankin (Stroke Patients Only)       Balance Overall balance assessment: Mild deficits observed, not formally tested                                           Pertinent Vitals/Pain Pain Assessment: 0-10 Pain Score: 6  Pain Location: back Pain Descriptors / Indicators: Aching;Discomfort;Guarding Pain Intervention(s): Limited activity within patient's tolerance;Monitored during session;Repositioned;Patient requesting pain meds-RN notified    Home Living Family/patient expects to be discharged to:: Private residence Living Arrangements: Children Available Help at Discharge: Family;Available 24 hours/day Type of Home: House Home Access: Stairs to enter Entrance Stairs-Rails: Psychiatric nurse of Steps: 3 Home Layout: One level Home Equipment: None      Prior Function Level of Independence: Independent               Hand Dominance        Extremity/Trunk Assessment   Upper Extremity Assessment Upper Extremity Assessment: Defer to OT evaluation    Lower Extremity Assessment Lower Extremity Assessment: Generalized weakness;RLE deficits/detail RLE Deficits / Details: patient reporting numbness of R LE; mild weakness noted in weight bearing    Cervical / Trunk Assessment Cervical / Trunk Assessment: Normal  Communication   Communication: No difficulties  Cognition Arousal/Alertness: Awake/alert Behavior During Therapy: WFL for tasks assessed/performed Overall Cognitive Status: Within Functional Limits for tasks assessed  General Comments      Exercises     Assessment/Plan    PT Assessment Patient needs continued PT services  PT Problem List Decreased strength;Decreased activity tolerance;Decreased  balance;Decreased mobility;Decreased knowledge of use of DME;Decreased safety awareness       PT Treatment Interventions DME instruction;Gait training;Stair training;Functional mobility training;Therapeutic activities;Therapeutic exercise;Balance training;Patient/family education    PT Goals (Current goals can be found in the Care Plan section)  Acute Rehab PT Goals Patient Stated Goal: return home PT Goal Formulation: With patient Time For Goal Achievement: 07/11/18 Potential to Achieve Goals: Good    Frequency Min 5X/week   Barriers to discharge        Co-evaluation PT/OT/SLP Co-Evaluation/Treatment: Yes Reason for Co-Treatment: For patient/therapist safety;To address functional/ADL transfers PT goals addressed during session: Mobility/safety with mobility;Balance;Proper use of DME;Strengthening/ROM         AM-PAC PT "6 Clicks" Mobility  Outcome Measure Help needed turning from your back to your side while in a flat bed without using bedrails?: A Little Help needed moving from lying on your back to sitting on the side of a flat bed without using bedrails?: A Little Help needed moving to and from a bed to a chair (including a wheelchair)?: A Little Help needed standing up from a chair using your arms (e.g., wheelchair or bedside chair)?: A Little Help needed to walk in hospital room?: A Little Help needed climbing 3-5 steps with a railing? : A Little 6 Click Score: 18    End of Session Equipment Utilized During Treatment: Gait belt;Back brace Activity Tolerance: Patient tolerated treatment well Patient left: in chair;with call bell/phone within reach Nurse Communication: Mobility status;Patient requests pain meds PT Visit Diagnosis: Unsteadiness on feet (R26.81);Other abnormalities of gait and mobility (R26.89);Muscle weakness (generalized) (M62.81)    Time: 0826-0920 PT Time Calculation (min) (ACUTE ONLY): 54 min   Charges:   PT Evaluation $PT Eval Moderate  Complexity: 1 Mod PT Treatments $Gait Training: 8-22 mins      Lanney Gins, PT, DPT Supplemental Physical Therapist 06/27/18 11:43 AM Pager: [phone] Office: [phone]

## 2018-06-30 MED FILL — Sodium Chloride IV Soln 0.9%: INTRAVENOUS | Qty: 1000 | Status: AC

## 2018-06-30 MED FILL — Heparin Sodium (Porcine) Inj 1000 Unit/ML: INTRAMUSCULAR | Qty: 30 | Status: AC

## 2018-07-03 ENCOUNTER — Encounter (HOSPITAL_COMMUNITY): Payer: Self-pay | Admitting: Orthopedic Surgery

## 2018-07-10 NOTE — Discharge Summary (Signed)
Patient ID: Tracy Meyer MRN: [id number] DOB/AGE: 1965/09/07 53 y.o.  Admit date: 06/25/2018 Discharge date: 06/27/2018  Admission Diagnoses:  Active Problems:   Radiculopathy   Discharge Diagnoses:  Same  History reviewed. No pertinent past medical history.  Surgeries: Procedure(s): LUMBAR 5 - SACRUM 1 POSTERIOR SPINAL FUSION WITH INSTRUMENTATION AND ALLOGRAFT on 06/26/2018   Consultants: Dr Donnetta Hutching Vascular exposure  Discharged Condition: Improved  Hospital Course: Tracy Meyer is an 53 y.o. female who was admitted 06/25/2018 for operative treatment of DDD and radiculopathy. Patient has severe unremitting pain that affects sleep, daily activities, and work/hobbies. After pre-op clearance the patient was taken to the operating room on 06/26/2018 and underwent  Procedure(s): LUMBAR 5 - SACRUM 1 POSTERIOR SPINAL FUSION WITH INSTRUMENTATION AND ALLOGRAFT.    Patient was given perioperative antibiotics:  Anti-infectives (From admission, onward)   Start     Dose/Rate Route Frequency Ordered Stop   06/26/18 0725  ceFAZolin (ANCEF) 2-4 GM/100ML-% IVPB    Note to Pharmacy: [facility]   : cabinet override      06/26/18 0725 06/26/18 1929   06/26/18 0724  ceFAZolin (ANCEF) 1-4 GM/50ML-% IVPB    Note to Pharmacy: [facility]   : cabinet override      06/26/18 0724 06/26/18 1929   06/25/18 2030  ceFAZolin (ANCEF) IVPB 2g/100 mL premix     2 g 200 mL/hr over 30 Minutes Intravenous Every 8 hours 06/25/18 1746 06/26/18 0536   06/25/18 0600  ceFAZolin (ANCEF) 3 g in dextrose 5 % 50 mL IVPB     3 g 100 mL/hr over 30 Minutes Intravenous On call to O.R. 06/24/18 0844 06/25/18 1228       Patient was given sequential compression devices, early ambulation to prevent DVT.  Patient benefited maximally from hospital stay and there were no complications.    Recent vital signs: BP (!) 144/64 (BP Location: Left Arm)   Pulse 89   Temp 98.7 F (37.1 C) (Oral)   Resp 18   Ht 5\' 7"  (1.702  m)   Wt 131.1 kg   LMP 04/15/2014   SpO2 97%   BMI 45.26 kg/m    Discharge Medications:   Allergies as of 06/27/2018      Reactions   Shellfish Allergy Anaphylaxis      Medication List    TAKE these medications   cholecalciferol 25 MCG (1000 UT) tablet Commonly known as: VITAMIN D3 Take 1,000 Units by mouth daily.   diazepam 5 MG tablet Commonly known as: VALIUM Take 1 tablet (5 mg total) by mouth every 6 (six) hours as needed for muscle spasms.   ECHINACEA PO Take 1 tablet by mouth daily.   ELDERBERRY PO Take 50 mg by mouth daily.   multivitamin with minerals Tabs tablet Take 1 tablet by mouth daily.   oxyCODONE-acetaminophen 5-325 MG tablet Commonly known as: PERCOCET/ROXICET Take 1-2 tablets by mouth every 4 (four) hours as needed for moderate pain or severe pain.   vitamin B-12 1000 MCG tablet Commonly known as: CYANOCOBALAMIN Take 1,000 mcg by mouth daily.   vitamin E 100 UNIT capsule Take by mouth daily.       Diagnostic Studies: Dg Lumbar Spine 2-3 Views  Result Date: 06/26/2018 CLINICAL DATA:  L5-S1 surgical fusion. EXAM: LUMBAR SPINE - 2-3 VIEW; DG C-ARM 61-120 MIN FLUOROSCOPY TIME:  3 minutes 23 seconds. COMPARISON:  Fluoroscopic images of June 25, 2018. FINDINGS: Two intraoperative fluoroscopic images demonstrate interval surgical posterior fusion of L5-S1  with bilateral intrapedicular screw placement. IMPRESSION: Fluoroscopic guidance provided during surgical posterior fusion of L5-S1. Electronically Signed   By: Marijo Conception M.D.   On: 06/26/2018 09:50   Dg Lumbar Spine 2-3 Views  Result Date: 06/25/2018 CLINICAL DATA:  Surgical fusion of L5-S1. EXAM: DG C-ARM 61-120 MIN; LUMBAR SPINE - 2-3 VIEW FLUOROSCOPY TIME:  1 minutes 13 seconds. COMPARISON:  MRI of November 08, 2017. FINDINGS: Two intraoperative fluoroscopic images demonstrate the patient be status post surgical anterior fusion of L5-S1. Good alignment of vertebral bodies is noted. IMPRESSION:  Status post surgical anterior fusion of L5-S1. Electronically Signed   By: Marijo Conception M.D.   On: 06/25/2018 13:37   Ct Lumbar Spine Wo Contrast  Result Date: 06/26/2018 CLINICAL DATA:  Back pain after lumbar surgery EXAM: CT LUMBAR SPINE WITHOUT CONTRAST TECHNIQUE: Multidetector CT imaging of the lumbar spine was performed without intravenous contrast administration. Multiplanar CT image reconstructions were also generated. COMPARISON:  Fluoroscopy from earlier today.  11/08/2017 FINDINGS: Segmentation: Standard lumbar numbering based on 2019 MRI Alignment: Normal Vertebrae: L5-S1 anterior lumbar interbody fusion with well-positioned hardware. There is also posterior rod and pedicle screws at L5-S1 which are in expected position. Residual foraminal narrowing from disc bulge and ridging, greater on the right. No evidence of canal impingement. No gross canal hematoma, although limited by CT. Paraspinal and other soft tissues: Expected retroperitoneal stranding related to recent surgery. Right renal cyst. Cholecystectomy. Lower lobe atelectasis Disc levels: No new finding when compared to preoperative MRI. IMPRESSION: No hardware complication or fracture after L5-S1 ALIF and posterolateral fusion. Electronically Signed   By: Monte Fantasia M.D.   On: 06/26/2018 11:39   Dg C-arm 1-60 Min  Result Date: 06/26/2018 CLINICAL DATA:  L5-S1 surgical fusion. EXAM: LUMBAR SPINE - 2-3 VIEW; DG C-ARM 61-120 MIN FLUOROSCOPY TIME:  3 minutes 23 seconds. COMPARISON:  Fluoroscopic images of June 25, 2018. FINDINGS: Two intraoperative fluoroscopic images demonstrate interval surgical posterior fusion of L5-S1 with bilateral intrapedicular screw placement. IMPRESSION: Fluoroscopic guidance provided during surgical posterior fusion of L5-S1. Electronically Signed   By: Marijo Conception M.D.   On: 06/26/2018 09:50   Dg C-arm 1-60 Min  Result Date: 06/25/2018 CLINICAL DATA:  Surgical fusion of L5-S1. EXAM: DG C-ARM 61-120 MIN;  LUMBAR SPINE - 2-3 VIEW FLUOROSCOPY TIME:  1 minutes 13 seconds. COMPARISON:  MRI of November 08, 2017. FINDINGS: Two intraoperative fluoroscopic images demonstrate the patient be status post surgical anterior fusion of L5-S1. Good alignment of vertebral bodies is noted. IMPRESSION: Status post surgical anterior fusion of L5-S1. Electronically Signed   By: Marijo Conception M.D.   On: 06/25/2018 13:37   Dg Or Local Abdomen  Result Date: 06/25/2018 CLINICAL DATA:  Anterior lumbar fusion. EXAM: OR LOCAL ABDOMEN COMPARISON:  None. FINDINGS: The bowel gas pattern is normal. Status post surgical anterior fusion of L4-5. No other radiopaque foreign body is noted. No radio-opaque calculi or other significant radiographic abnormality are seen. IMPRESSION: Status post surgical anterior fusion of L4-5. No other radiopaque foreign body is noted. These results were called by telephone at the time of interpretation on 06/25/2018 at 12:39 pm to Virginia Rochester, who verbally acknowledged these results. Electronically Signed   By: Marijo Conception M.D.   On: 06/25/2018 12:40    Disposition:    PO s/p A/P lumbar fusion, doing well  -Written scripts for pain seny to pharm -D/C instructions sheet printed and in chart -D/C today  -F/U  in office 2 weeks   Signed: Justice Britain 07/10/2018, 1:17 PM

## 2019-02-17 ENCOUNTER — Other Ambulatory Visit: Payer: Self-pay | Admitting: Orthopedic Surgery

## 2019-02-18 NOTE — Progress Notes (Signed)
[facility][id number] [place], [place] [id number] [phone] [phone]      Your procedure is scheduled on Wednesday, 02/25/19.  Report to [facility] Main Entrance "A" at 5:30 A.M., and check in at the Admitting office.  Call this number if you have problems the morning of surgery:  [phone]  Call [phone] if you have any questions prior to your surgery date Monday-Friday 8am-4pm    Remember:  Do not eat after midnight the night before your surgery (Tuesday)    You may drink clear liquids until 5:30 am the morning of your surgery.   Clear liquids allowed are: Water, Non-Citrus Juices (without pulp), Carbonated Beverages, Clear Tea, Black Coffee Only, and Gatorade  Enhanced Recovery after Surgery for Orthopedics Enhanced Recovery after Surgery is a protocol used to improve the stress on your body and your recovery after surgery.  Patient Instructions  . The night before surgery:  o No food after midnight. ONLY clear liquids after midnight til 5:30 am day of surgery.  .  Please complete your PRE-SURGERY ENSURE that was provided to you by ... the morning of surgery.  Please, if able, drink it in one setting. DO NOT SIP.  Nothing else to drink after completing the Pre-Surgery Clear Ensure.         Take these medicines the morning of surgery with A SIP OF WATER: None  7 days prior to surgery STOP taking any Aspirin (unless otherwise instructed by your surgeon), Aleve, Naproxen, Ibuprofen, Motrin, Advil, Goody's, BC's, all herbal medications, fish oil, and all vitamins.    The Morning of Surgery  Do not wear jewelry, make-up or nail polish.  Do not wear lotions, powders,  Perfumes, or deodorant  Do not shave 48 hours prior to surgery. .  Do not bring valuables to the hospital.  [facility] is not responsible for any belongings or valuables.  If you are a smoker, DO NOT Smoke 24 hours prior to surgery  If  you wear a CPAP at night please bring your mask the morning of surgery   Remember that you must have someone to transport you home after your surgery, and remain with you for 24 hours if you are discharged the same day.  Please bring cases for contacts, glasses, hearing aids, dentures or bridgework because it cannot be worn into surgery.   Leave your suitcase in the car.  After surgery it may be brought to your room.  For patients admitted to the hospital, discharge time will be determined by your treatment team.  Patients discharged the day of surgery will not be allowed to drive home.    Special instructions:   [facility]- Preparing For Surgery  Before surgery, you can play an important role. Because skin is not sterile, your skin needs to be as free of germs as possible. You can reduce the number of germs on your skin by washing with CHG (chlorahexidine gluconate) Soap before surgery.  CHG is an antiseptic cleaner which kills germs and bonds with the skin to continue killing germs even after washing.    Oral Hygiene is also important to reduce your risk of infection.  Remember - BRUSH YOUR TEETH THE MORNING OF SURGERY WITH YOUR REGULAR TOOTHPASTE  Please do not use if you have an allergy to CHG or antibacterial soaps. If your skin becomes reddened/irritated stop using the CHG.  Do not shave (including legs and underarms) for at least 48  hours prior to first CHG shower. It is OK to shave your face.  Please follow these instructions carefully.   1. Shower the NIGHT BEFORE SURGERY Tues and the MORNING OF SURGERY Wed with CHG Soap.   2. If you chose to wash your hair, wash your hair first as usual with your normal shampoo.  3. After you shampoo, rinse your hair and body thoroughly to remove the shampoo.  4. Use CHG as you would any other liquid soap. You can apply CHG directly to the skin and wash gently with a scrungie or a clean washcloth.   5. Apply the CHG Soap to your body ONLY  FROM THE NECK DOWN.  Do not use on open wounds or open sores. Avoid contact with your eyes, ears, mouth and genitals (private parts). Wash Face and genitals (private parts)  with your normal soap.   6. Wash thoroughly, paying special attention to the area where your surgery will be performed.  7. Thoroughly rinse your body with warm water from the neck down.  8. DO NOT shower/wash with your normal soap after using and rinsing off the CHG Soap.  9. Pat yourself dry with a CLEAN TOWEL.  10. Wear CLEAN PAJAMAS to bed the night before surgery, wear comfortable clothes the morning of surgery  11. Place CLEAN SHEETS on your bed the night of your first shower and DO NOT SLEEP WITH PETS.    Day of Surgery:  Please shower the morning of surgery with the CHG soap Do not apply any deodorants/lotions. Please wear clean clothes to the hospital/surgery center.   Remember to brush your teeth WITH YOUR REGULAR TOOTHPASTE.   Please read over the following fact sheets that you were given.

## 2019-02-19 ENCOUNTER — Other Ambulatory Visit: Payer: Self-pay

## 2019-02-19 ENCOUNTER — Encounter (HOSPITAL_COMMUNITY)
Admission: RE | Admit: 2019-02-19 | Discharge: 2019-02-19 | Disposition: A | Discharge status: Home / Self Care | Payer: Managed Care, Other (non HMO) | Source: Ambulatory Visit | Attending: Orthopedic Surgery | Admitting: Orthopedic Surgery

## 2019-02-19 ENCOUNTER — Encounter (HOSPITAL_COMMUNITY): Payer: Self-pay

## 2019-02-19 DIAGNOSIS — Z01812 Encounter for preprocedural laboratory examination: Secondary | ICD-10-CM | POA: Diagnosis not present

## 2019-02-19 HISTORY — DX: Other specified postprocedural states: R11.2

## 2019-02-19 HISTORY — DX: Pneumonia, unspecified organism: J18.9

## 2019-02-19 HISTORY — DX: Other specified postprocedural states: Z98.890

## 2019-02-19 HISTORY — DX: Nausea with vomiting, unspecified: R11.2

## 2019-02-19 LAB — TYPE AND SCREEN
ABO/RH(D): O POS
Antibody Screen: NEGATIVE

## 2019-02-19 LAB — COMPREHENSIVE METABOLIC PANEL
ALT: 19 U/L (ref 0–44)
AST: 20 U/L (ref 15–41)
Albumin: 3.6 g/dL (ref 3.5–5.0)
Alkaline Phosphatase: 83 U/L (ref 38–126)
Anion gap: 10 (ref 5–15)
BUN: 13 mg/dL (ref 6–20)
CO2: 28 mmol/L (ref 22–32)
Calcium: 8.9 mg/dL (ref 8.9–10.3)
Chloride: 103 mmol/L (ref 98–111)
Creatinine, Ser: 0.98 mg/dL (ref 0.44–1.00)
GFR calc Af Amer: >60 mL/min (ref >60)
GFR calc non Af Amer: >60 mL/min (ref >60)
Glucose, Bld: 150 mg/dL — ABNORMAL HIGH (ref 70–99)
Potassium: 3.8 mmol/L (ref 3.5–5.1)
Sodium: 141 mmol/L (ref 135–145)
Total Bilirubin: 1.3 mg/dL — ABNORMAL HIGH (ref 0.3–1.2)
Total Protein: 6.6 g/dL (ref 6.5–8.1)

## 2019-02-19 LAB — URINALYSIS, ROUTINE W REFLEX MICROSCOPIC
Bilirubin Urine: NEGATIVE
Glucose, UA: NEGATIVE mg/dL
Hgb urine dipstick: NEGATIVE
Ketones, ur: NEGATIVE mg/dL
Leukocytes,Ua: NEGATIVE
Nitrite: NEGATIVE
Protein, ur: NEGATIVE mg/dL
Specific Gravity, Urine: 1.021 (ref 1.005–1.030)
pH: 5 (ref 5.0–8.0)

## 2019-02-19 LAB — PROTIME-INR
INR: 1 (ref 0.8–1.2)
Prothrombin Time: 12.8 seconds (ref 11.4–15.2)

## 2019-02-19 LAB — CBC WITH DIFFERENTIAL/PLATELET
Abs Immature Granulocytes: 0.02 10*3/uL (ref 0.00–0.07)
Basophils Absolute: 0 10*3/uL (ref 0.0–0.1)
Basophils Relative: 0 %
Eosinophils Absolute: 0.2 10*3/uL (ref 0.0–0.5)
Eosinophils Relative: 3 %
HCT: 40.8 % (ref 36.0–46.0)
Hemoglobin: 13 g/dL (ref 12.0–15.0)
Immature Granulocytes: 0 %
Lymphocytes Relative: 39 %
Lymphs Abs: 2.3 10*3/uL (ref 0.7–4.0)
MCH: 30 pg (ref 26.0–34.0)
MCHC: 31.9 g/dL (ref 30.0–36.0)
MCV: 94.2 fL (ref 80.0–100.0)
Monocytes Absolute: 0.4 10*3/uL (ref 0.1–1.0)
Monocytes Relative: 8 %
Neutro Abs: 2.8 10*3/uL (ref 1.7–7.7)
Neutrophils Relative %: 50 %
Platelets: 287 10*3/uL (ref 150–400)
RBC: 4.33 MIL/uL (ref 3.87–5.11)
RDW: 14 % (ref 11.5–15.5)
WBC: 5.8 10*3/uL (ref 4.0–10.5)
nRBC: 0 % (ref 0.0–0.2)

## 2019-02-19 LAB — APTT: aPTT: 27 seconds (ref 24–36)

## 2019-02-19 LAB — SURGICAL PCR SCREEN
MRSA, PCR: NEGATIVE
Staphylococcus aureus: NEGATIVE

## 2019-02-19 NOTE — Progress Notes (Signed)
PCP - Benita Stabile, MD Cardiologist - denies  PPM/ICD - denies  Chest x-ray - N/A EKG - N/A Stress Test - Denies ECHO - Denies Cardiac Cath - Denies  Sleep Study - Denies  Patient denies being a diabetic   Blood Thinner Instructions: N/A Aspirin Instructions: N/A  ERAS Protcol - Yes PRE-SURGERY Ensure or G2-  Ensure given  COVID TEST- 02/21/19   Anesthesia review: No  Patient denies shortness of breath, fever, cough and chest pain at PAT appointment  Coronavirus Screening  Have you experienced the following symptoms:  Cough yes/no: No Fever (>100.75F)  yes/no: No Runny nose yes/no: No Sore throat yes/no: No Difficulty breathing/shortness of breath  yes/no: No  Have you or a family member traveled in the last 14 days and where? yes/no: No   If the patient indicates "YES" to the above questions, their PAT will be rescheduled to limit the exposure to others and, the surgeon will be notified. THE PATIENT WILL NEED TO BE ASYMPTOMATIC FOR 14 DAYS.   If the patient is not experiencing any of these symptoms, the PAT nurse will instruct them to NOT bring anyone with them to their appointment since they may have these symptoms or traveled as well.   Please remind your patients and families that hospital visitation restrictions are in effect and the importance of the restrictions.    All instructions explained to the patient, with a verbal understanding of the material. Patient agrees to go over the instructions while at home for a better understanding. Patient also instructed to self quarantine after being tested for COVID-19. The opportunity to ask questions was provided.

## 2019-02-21 ENCOUNTER — Other Ambulatory Visit (HOSPITAL_COMMUNITY)
Admission: RE | Admit: 2019-02-21 | Discharge: 2019-02-21 | Disposition: A | Discharge status: Home / Self Care | Payer: Managed Care, Other (non HMO) | Source: Ambulatory Visit | Attending: Orthopedic Surgery | Admitting: Orthopedic Surgery

## 2019-02-21 DIAGNOSIS — Z20822 Contact with and (suspected) exposure to covid-19: Secondary | ICD-10-CM | POA: Diagnosis not present

## 2019-02-21 DIAGNOSIS — Z01812 Encounter for preprocedural laboratory examination: Secondary | ICD-10-CM | POA: Diagnosis present

## 2019-02-21 LAB — SARS CORONAVIRUS 2 (TAT 6-24 HRS): SARS Coronavirus 2: NEGATIVE

## 2019-02-24 ENCOUNTER — Encounter (HOSPITAL_COMMUNITY): Payer: Self-pay | Admitting: Orthopedic Surgery

## 2019-02-24 MED ORDER — DEXTROSE 5 % IV SOLN
3.0000 g | INTRAVENOUS | Status: AC
  Administered 2019-02-25: 3 g via INTRAVENOUS
  Filled 2019-02-24: qty 3000
  Filled 2019-02-24: qty 3

## 2019-02-25 ENCOUNTER — Ambulatory Visit (HOSPITAL_COMMUNITY): Payer: Managed Care, Other (non HMO)

## 2019-02-25 ENCOUNTER — Encounter (HOSPITAL_COMMUNITY): Payer: Self-pay | Admitting: Orthopedic Surgery

## 2019-02-25 ENCOUNTER — Ambulatory Visit (HOSPITAL_COMMUNITY)
Admission: RE | Disposition: A | Discharge status: Home / Self Care | Payer: Self-pay | Source: Home / Self Care | Attending: Orthopedic Surgery

## 2019-02-25 ENCOUNTER — Ambulatory Visit (HOSPITAL_COMMUNITY)
Admission: RE | Admit: 2019-02-25 | Discharge: 2019-02-25 | Disposition: A | Discharge status: Home / Self Care | Payer: Managed Care, Other (non HMO) | Attending: Orthopedic Surgery | Admitting: Orthopedic Surgery

## 2019-02-25 ENCOUNTER — Ambulatory Visit (HOSPITAL_COMMUNITY): Payer: Managed Care, Other (non HMO) | Admitting: Certified Registered Nurse Anesthetist

## 2019-02-25 ENCOUNTER — Ambulatory Visit (HOSPITAL_COMMUNITY): Payer: Managed Care, Other (non HMO) | Admitting: Vascular Surgery

## 2019-02-25 ENCOUNTER — Other Ambulatory Visit: Payer: Self-pay

## 2019-02-25 DIAGNOSIS — Z981 Arthrodesis status: Secondary | ICD-10-CM | POA: Diagnosis not present

## 2019-02-25 DIAGNOSIS — M5416 Radiculopathy, lumbar region: Secondary | ICD-10-CM | POA: Insufficient documentation

## 2019-02-25 DIAGNOSIS — Z6841 Body Mass Index (BMI) 40.0 and over, adult: Secondary | ICD-10-CM | POA: Insufficient documentation

## 2019-02-25 DIAGNOSIS — M4606 Spinal enthesopathy, lumbar region: Secondary | ICD-10-CM | POA: Insufficient documentation

## 2019-02-25 DIAGNOSIS — M4807 Spinal stenosis, lumbosacral region: Secondary | ICD-10-CM | POA: Insufficient documentation

## 2019-02-25 DIAGNOSIS — Z419 Encounter for procedure for purposes other than remedying health state, unspecified: Secondary | ICD-10-CM

## 2019-02-25 HISTORY — PX: LUMBAR LAMINECTOMY/DECOMPRESSION MICRODISCECTOMY: SHX5026

## 2019-02-25 HISTORY — PX: HARDWARE REMOVAL: SHX979

## 2019-02-25 SURGERY — LUMBAR LAMINECTOMY/DECOMPRESSION MICRODISCECTOMY
Anesthesia: General

## 2019-02-25 MED ORDER — PHENYLEPHRINE 40 MCG/ML (10ML) SYRINGE FOR IV PUSH (FOR BLOOD PRESSURE SUPPORT)
PREFILLED_SYRINGE | INTRAVENOUS | Status: AC
  Filled 2019-02-25: qty 10

## 2019-02-25 MED ORDER — DEXAMETHASONE SODIUM PHOSPHATE 10 MG/ML IJ SOLN
INTRAMUSCULAR | Status: DC | PRN
  Administered 2019-02-25: 10 mg via INTRAVENOUS

## 2019-02-25 MED ORDER — LACTATED RINGERS IV SOLN: INTRAVENOUS | Status: DC | PRN

## 2019-02-25 MED ORDER — PHENYLEPHRINE 40 MCG/ML (10ML) SYRINGE FOR IV PUSH (FOR BLOOD PRESSURE SUPPORT)
PREFILLED_SYRINGE | INTRAVENOUS | Status: DC | PRN
  Administered 2019-02-25: 80 ug via INTRAVENOUS
  Administered 2019-02-25: 40 ug via INTRAVENOUS
  Administered 2019-02-25: 80 ug via INTRAVENOUS
  Administered 2019-02-25: 40 ug via INTRAVENOUS
  Administered 2019-02-25 (×2): 80 ug via INTRAVENOUS

## 2019-02-25 MED ORDER — KETOROLAC TROMETHAMINE 30 MG/ML IJ SOLN
INTRAMUSCULAR | Status: DC | PRN
  Administered 2019-02-25: 30 mg via INTRAVENOUS

## 2019-02-25 MED ORDER — POVIDONE-IODINE 7.5 % EX SOLN: Freq: Once | CUTANEOUS | Status: DC

## 2019-02-25 MED ORDER — OXYCODONE-ACETAMINOPHEN 5-325 MG PO TABS: 1.0000 | ORAL_TABLET | ORAL | 0 refills | Status: AC | PRN

## 2019-02-25 MED ORDER — THROMBIN 20000 UNITS EX KIT
PACK | CUTANEOUS | Status: AC
  Filled 2019-02-25: qty 1

## 2019-02-25 MED ORDER — ONDANSETRON HCL 4 MG/2ML IJ SOLN: 4.0000 mg | Freq: Once | INTRAMUSCULAR | Status: DC | PRN

## 2019-02-25 MED ORDER — EPINEPHRINE PF 1 MG/ML IJ SOLN
INTRAMUSCULAR | Status: DC | PRN
  Administered 2019-02-25: .15 mL

## 2019-02-25 MED ORDER — 0.9 % SODIUM CHLORIDE (POUR BTL) OPTIME
TOPICAL | Status: DC | PRN
  Administered 2019-02-25: 1000 mL

## 2019-02-25 MED ORDER — ONDANSETRON HCL 4 MG/2ML IJ SOLN
INTRAMUSCULAR | Status: AC
  Filled 2019-02-25: qty 2

## 2019-02-25 MED ORDER — FENTANYL CITRATE (PF) 250 MCG/5ML IJ SOLN
INTRAMUSCULAR | Status: AC
  Filled 2019-02-25: qty 5

## 2019-02-25 MED ORDER — PROPOFOL 10 MG/ML IV BOLUS
INTRAVENOUS | Status: DC | PRN
  Administered 2019-02-25: 200 mg via INTRAVENOUS

## 2019-02-25 MED ORDER — MIDAZOLAM HCL 2 MG/2ML IJ SOLN
INTRAMUSCULAR | Status: AC
  Filled 2019-02-25: qty 2

## 2019-02-25 MED ORDER — ONDANSETRON HCL 4 MG/2ML IJ SOLN
INTRAMUSCULAR | Status: DC | PRN
  Administered 2019-02-25: 4 mg via INTRAVENOUS

## 2019-02-25 MED ORDER — BUPIVACAINE HCL (PF) 0.25 % IJ SOLN
INTRAMUSCULAR | Status: DC | PRN
  Administered 2019-02-25: 30 mL

## 2019-02-25 MED ORDER — METHOCARBAMOL 500 MG PO TABS: 500.0000 mg | ORAL_TABLET | Freq: Four times a day (QID) | ORAL | 0 refills | Status: DC | PRN

## 2019-02-25 MED ORDER — PROPOFOL 10 MG/ML IV BOLUS
INTRAVENOUS | Status: AC
  Filled 2019-02-25: qty 40

## 2019-02-25 MED ORDER — BUPIVACAINE HCL (PF) 0.25 % IJ SOLN
INTRAMUSCULAR | Status: AC
  Filled 2019-02-25: qty 30

## 2019-02-25 MED ORDER — HEMOSTATIC AGENTS (NO CHARGE) OPTIME
TOPICAL | Status: DC | PRN
  Administered 2019-02-25: 1 via TOPICAL

## 2019-02-25 MED ORDER — EPINEPHRINE PF 1 MG/ML IJ SOLN
INTRAMUSCULAR | Status: AC
  Filled 2019-02-25: qty 1

## 2019-02-25 MED ORDER — METHYLENE BLUE 0.5 % INJ SOLN
INTRAVENOUS | Status: AC
  Filled 2019-02-25: qty 10

## 2019-02-25 MED ORDER — KETOROLAC TROMETHAMINE 30 MG/ML IJ SOLN
INTRAMUSCULAR | Status: AC
  Filled 2019-02-25: qty 1

## 2019-02-25 MED ORDER — SUGAMMADEX SODIUM 200 MG/2ML IV SOLN
INTRAVENOUS | Status: DC | PRN
  Administered 2019-02-25: 250 mg via INTRAVENOUS

## 2019-02-25 MED ORDER — PHENYLEPHRINE HCL-NACL 10-0.9 MG/250ML-% IV SOLN
INTRAVENOUS | Status: DC | PRN
  Administered 2019-02-25: 25 ug/min via INTRAVENOUS

## 2019-02-25 MED ORDER — BUPIVACAINE LIPOSOME 1.3 % IJ SUSP
20.0000 mL | Freq: Once | INTRAMUSCULAR | Status: DC
  Filled 2019-02-25: qty 20

## 2019-02-25 MED ORDER — LIDOCAINE 2% (20 MG/ML) 5 ML SYRINGE
INTRAMUSCULAR | Status: AC
  Filled 2019-02-25: qty 5

## 2019-02-25 MED ORDER — THROMBIN 20000 UNITS EX SOLR
CUTANEOUS | Status: DC | PRN
  Administered 2019-02-25: 20 mL via TOPICAL

## 2019-02-25 MED ORDER — BUPIVACAINE LIPOSOME 1.3 % IJ SUSP
INTRAMUSCULAR | Status: DC | PRN
  Administered 2019-02-25: 20 mL

## 2019-02-25 MED ORDER — OXYCODONE HCL 5 MG PO TABS: 5.0000 mg | ORAL_TABLET | Freq: Once | ORAL | Status: DC | PRN

## 2019-02-25 MED ORDER — MIDAZOLAM HCL 5 MG/5ML IJ SOLN
INTRAMUSCULAR | Status: DC | PRN
  Administered 2019-02-25: 2 mg via INTRAVENOUS

## 2019-02-25 MED ORDER — FENTANYL CITRATE (PF) 100 MCG/2ML IJ SOLN
INTRAMUSCULAR | Status: DC | PRN
  Administered 2019-02-25 (×3): 50 ug via INTRAVENOUS
  Administered 2019-02-25: 100 ug via INTRAVENOUS
  Administered 2019-02-25: 50 ug via INTRAVENOUS

## 2019-02-25 MED ORDER — DEXAMETHASONE SODIUM PHOSPHATE 10 MG/ML IJ SOLN
INTRAMUSCULAR | Status: AC
  Filled 2019-02-25: qty 1

## 2019-02-25 MED ORDER — ROCURONIUM BROMIDE 100 MG/10ML IV SOLN
INTRAVENOUS | Status: DC | PRN
  Administered 2019-02-25: 20 mg via INTRAVENOUS
  Administered 2019-02-25: 80 mg via INTRAVENOUS
  Administered 2019-02-25 (×2): 20 mg via INTRAVENOUS

## 2019-02-25 MED ORDER — METHYLPREDNISOLONE ACETATE 40 MG/ML IJ SUSP
INTRAMUSCULAR | Status: DC | PRN
  Administered 2019-02-25: 40 mg

## 2019-02-25 MED ORDER — OXYCODONE HCL 5 MG/5ML PO SOLN: 5.0000 mg | Freq: Once | ORAL | Status: DC | PRN

## 2019-02-25 MED ORDER — LIDOCAINE 2% (20 MG/ML) 5 ML SYRINGE
INTRAMUSCULAR | Status: DC | PRN
  Administered 2019-02-25: 100 mg via INTRAVENOUS

## 2019-02-25 MED ORDER — FENTANYL CITRATE (PF) 100 MCG/2ML IJ SOLN: 25.0000 ug | INTRAMUSCULAR | Status: DC | PRN

## 2019-02-25 MED ORDER — METHYLPREDNISOLONE ACETATE 40 MG/ML IJ SUSP
INTRAMUSCULAR | Status: AC
  Filled 2019-02-25: qty 1

## 2019-02-25 MED ORDER — ROCURONIUM BROMIDE 10 MG/ML (PF) SYRINGE
PREFILLED_SYRINGE | INTRAVENOUS | Status: AC
  Filled 2019-02-25: qty 10

## 2019-02-25 SURGICAL SUPPLY — 84 items
AGENT HMST KT MTR STRL THRMB (HEMOSTASIS)
APL SKNCLS STERI-STRIP NONHPOA (GAUZE/BANDAGES/DRESSINGS) ×1
BENZOIN TINCTURE PRP APPL 2/3 (GAUZE/BANDAGES/DRESSINGS) ×2 IMPLANT
BUR PRECISION FLUTE 5.0 (BURR) ×1 IMPLANT
BUR ROUND PRECISION 4.0 (BURR) ×1 IMPLANT
BUR ROUND PRECISION 4.0MM (BURR) ×1
CABLE BIPOLOR RESECTION CORD (MISCELLANEOUS) ×3 IMPLANT
CANISTER SUCT 3000ML PPV (MISCELLANEOUS) ×3 IMPLANT
CARTRIDGE OIL MAESTRO DRILL (MISCELLANEOUS) ×1 IMPLANT
CLOSURE WOUND 1/2 X4 (GAUZE/BANDAGES/DRESSINGS) ×1
COVER SURGICAL LIGHT HANDLE (MISCELLANEOUS) ×3 IMPLANT
COVER WAND RF STERILE (DRAPES) ×1 IMPLANT
DIFFUSER DRILL AIR PNEUMATIC (MISCELLANEOUS) ×3 IMPLANT
DRAIN CHANNEL 15F RND FF W/TCR (WOUND CARE) IMPLANT
DRAPE C-ARM 42X72 X-RAY (DRAPES) IMPLANT
DRAPE INCISE IOBAN 66X45 STRL (DRAPES) ×2 IMPLANT
DRAPE POUCH INSTRU U-SHP 10X18 (DRAPES) ×6 IMPLANT
DRAPE SURG 17X23 STRL (DRAPES) ×12 IMPLANT
DURAPREP 26ML APPLICATOR (WOUND CARE) ×3 IMPLANT
ELECT BLADE 4.0 EZ CLEAN MEGAD (MISCELLANEOUS) ×3
ELECT CAUTERY BLADE 6.4 (BLADE) ×3 IMPLANT
ELECT REM PT RETURN 9FT ADLT (ELECTROSURGICAL) ×3
ELECTRODE BLDE 4.0 EZ CLN MEGD (MISCELLANEOUS) ×1 IMPLANT
ELECTRODE REM PT RTRN 9FT ADLT (ELECTROSURGICAL) ×1 IMPLANT
EVACUATOR SILICONE 100CC (DRAIN) IMPLANT
FILTER STRAW FLUID ASPIR (MISCELLANEOUS) ×3 IMPLANT
GAUZE 4X4 16PLY RFD (DISPOSABLE) ×6 IMPLANT
GAUZE SPONGE 4X4 12PLY STRL (GAUZE/BANDAGES/DRESSINGS) ×3 IMPLANT
GLOVE BIO SURGEON STRL SZ7 (GLOVE) ×3 IMPLANT
GLOVE BIO SURGEON STRL SZ8 (GLOVE) ×3 IMPLANT
GLOVE BIOGEL PI IND STRL 7.0 (GLOVE) ×1 IMPLANT
GLOVE BIOGEL PI IND STRL 8 (GLOVE) ×1 IMPLANT
GLOVE BIOGEL PI INDICATOR 7.0 (GLOVE) ×2
GLOVE BIOGEL PI INDICATOR 8 (GLOVE) ×2
GOWN STRL REUS W/ TWL LRG LVL3 (GOWN DISPOSABLE) ×2 IMPLANT
GOWN STRL REUS W/ TWL XL LVL3 (GOWN DISPOSABLE) ×2 IMPLANT
GOWN STRL REUS W/TWL LRG LVL3 (GOWN DISPOSABLE) ×6
GOWN STRL REUS W/TWL XL LVL3 (GOWN DISPOSABLE) ×6
IV CATH 14GX2 1/4 (CATHETERS) ×3 IMPLANT
KIT BASIN OR (CUSTOM PROCEDURE TRAY) ×3 IMPLANT
KIT POSITION SURG JACKSON T1 (MISCELLANEOUS) ×3 IMPLANT
KIT TURNOVER KIT B (KITS) ×3 IMPLANT
MANIFOLD NEPTUNE II (INSTRUMENTS) ×1 IMPLANT
NDL 18GX1X1/2 (RX/OR ONLY) (NEEDLE) ×1 IMPLANT
NDL HYPO 25GX1X1/2 BEV (NEEDLE) ×1 IMPLANT
NDL SPNL 18GX3.5 QUINCKE PK (NEEDLE) ×2 IMPLANT
NEEDLE 18GX1X1/2 (RX/OR ONLY) (NEEDLE) ×3 IMPLANT
NEEDLE 22X1 1/2 (OR ONLY) (NEEDLE) ×3 IMPLANT
NEEDLE HYPO 25GX1X1/2 BEV (NEEDLE) ×3 IMPLANT
NEEDLE SPNL 18GX3.5 QUINCKE PK (NEEDLE) ×6 IMPLANT
NS IRRIG 1000ML POUR BTL (IV SOLUTION) ×3 IMPLANT
OIL CARTRIDGE MAESTRO DRILL (MISCELLANEOUS) ×3
PACK LAMINECTOMY ORTHO (CUSTOM PROCEDURE TRAY) ×3 IMPLANT
PACK ORTHO EXTREMITY (CUSTOM PROCEDURE TRAY) ×3 IMPLANT
PACK UNIVERSAL I (CUSTOM PROCEDURE TRAY) ×3 IMPLANT
PAD ARMBOARD 7.5X6 YLW CONV (MISCELLANEOUS) ×6 IMPLANT
PATTIES SURGICAL .5 X.5 (GAUZE/BANDAGES/DRESSINGS) ×2 IMPLANT
PATTIES SURGICAL .5 X1 (DISPOSABLE) ×1 IMPLANT
SPONGE INTESTINAL PEANUT (DISPOSABLE) ×3 IMPLANT
SPONGE SURGIFOAM ABS GEL 100 (HEMOSTASIS) ×3 IMPLANT
SPONGE SURGIFOAM ABS GEL SZ50 (HEMOSTASIS) ×3 IMPLANT
STRIP CLOSURE SKIN 1/2X4 (GAUZE/BANDAGES/DRESSINGS) ×1 IMPLANT
SURGIFLO W/THROMBIN 8M KIT (HEMOSTASIS) IMPLANT
SUT BONE WAX W31G (SUTURE) ×2 IMPLANT
SUT MNCRL AB 4-0 PS2 18 (SUTURE) ×3 IMPLANT
SUT VIC AB 0 CT1 18XCR BRD 8 (SUTURE) IMPLANT
SUT VIC AB 0 CT1 27 (SUTURE)
SUT VIC AB 0 CT1 27XBRD ANBCTR (SUTURE) IMPLANT
SUT VIC AB 0 CT1 8-18 (SUTURE)
SUT VIC AB 1 CT1 18XCR BRD 8 (SUTURE) ×1 IMPLANT
SUT VIC AB 1 CT1 8-18 (SUTURE) ×3
SUT VIC AB 2-0 CT1 27 (SUTURE)
SUT VIC AB 2-0 CT1 TAPERPNT 27 (SUTURE) IMPLANT
SUT VIC AB 2-0 CT2 18 VCP726D (SUTURE) ×5 IMPLANT
SYR 20ML LL LF (SYRINGE) ×3 IMPLANT
SYR BULB IRRIGATION 50ML (SYRINGE) ×3 IMPLANT
SYR CONTROL 10ML LL (SYRINGE) ×6 IMPLANT
SYR TB 1ML 25GX5/8 (SYRINGE) ×6 IMPLANT
SYR TB 1ML LUER SLIP (SYRINGE) ×6 IMPLANT
TAPE CLOTH SURG 4X10 WHT LF (GAUZE/BANDAGES/DRESSINGS) ×2 IMPLANT
TOWEL GREEN STERILE (TOWEL DISPOSABLE) ×3 IMPLANT
TOWEL GREEN STERILE FF (TOWEL DISPOSABLE) ×3 IMPLANT
WATER STERILE IRR 1000ML POUR (IV SOLUTION) ×3 IMPLANT
YANKAUER SUCT BULB TIP NO VENT (SUCTIONS) ×3 IMPLANT

## 2019-02-25 NOTE — Anesthesia Preprocedure Evaluation (Addendum)
Anesthesia Evaluation  Patient identified by MRN, date of birth, ID band Patient awake    Reviewed: Allergy & Precautions, NPO status , Patient's Chart, lab work & pertinent test results  History of Anesthesia Complications (+) PONVNegative for: history of anesthetic complications  Airway Mallampati: IV  TM Distance: >3 FB Neck ROM: Full  Mouth opening: Limited Mouth Opening  Dental  (+) Teeth Intact   Pulmonary neg pulmonary ROS,    Pulmonary exam normal        Cardiovascular negative cardio ROS Normal cardiovascular exam     Neuro/Psych negative neurological ROS  negative psych ROS   GI/Hepatic negative GI ROS, Neg liver ROS,   Endo/Other  Morbid obesity  Renal/GU negative Renal ROS  negative genitourinary   Musculoskeletal negative musculoskeletal ROS (+)   Abdominal   Peds  Hematology negative hematology ROS (+)   Anesthesia Other Findings   Reproductive/Obstetrics                            Anesthesia Physical Anesthesia Plan  ASA: III  Anesthesia Plan: General   Post-op Pain Management:    Induction: Intravenous  PONV Risk Score and Plan: 4 or greater and Ondansetron, Dexamethasone, Treatment may vary due to age or medical condition and Midazolam  Airway Management Planned: Oral ETT  Additional Equipment: None  Intra-op Plan:   Post-operative Plan: Extubation in OR  Informed Consent: I have reviewed the patients History and Physical, chart, labs and discussed the procedure including the risks, benefits and alternatives for the proposed anesthesia with the patient or authorized representative who has indicated his/her understanding and acceptance.     Dental advisory given  Plan Discussed with:   Anesthesia Plan Comments:        Anesthesia Quick Evaluation

## 2019-02-25 NOTE — Transfer of Care (Signed)
Immediate Anesthesia Transfer of Care Note  Patient: Tracy Meyer  Procedure(s) Performed: LUMBAR 5 - SACRUM 1 DECOMPRESSION WITH REMOVAL OF HARDWARE (N/A ) HARDWARE REMOVAL (N/A )  Patient Location: PACU  Anesthesia Type:General  Level of Consciousness: drowsy  Airway & Oxygen Therapy: Patient Spontanous Breathing and Patient connected to face mask oxygen  Post-op Assessment: Report given to RN and Post -op Vital signs reviewed and stable  Post vital signs: Reviewed and stable  Last Vitals:  Vitals Value Taken Time  BP    Temp    Pulse    Resp    SpO2      Last Pain:  Vitals:   02/25/19 0650  TempSrc:   PainSc: 4       Patients Stated Pain Goal: 0 ([id number])  Complications: No apparent anesthesia complications

## 2019-02-25 NOTE — H&P (Signed)
PREOPERATIVE H&P  Chief Complaint: Right leg pain and weakness  HPI: Tracy Meyer is a 54 y.o. female who presents with ongoing pain and weakness in the right leg.  Her pain and weakness has been progressive.  She does feel very unsteady on her feet and has fallen occasionally.  Her work-up was consistent with right L5 radiculopathy.  Her MRI does reveal severe neuroforaminal stenosis on the right at L5-S1.  She is status post an L5-S1 fusion, but has been having ongoing right leg pain, very much consistent with neuroforaminal stenosis, severe, at the L5-S1 level.  Patient has failed multiple forms of conservative care and continues to have pain (see office notes for additional details regarding the patient's full course of treatment)  Past Medical History:  Diagnosis Date  . Pneumonia   . PONV (postoperative nausea and vomiting)    Past Surgical History:  Procedure Laterality Date  . ABDOMINAL EXPOSURE N/A 06/25/2018   Procedure: ABDOMINAL EXPOSURE;  Surgeon: Rosetta Posner, MD;  Location: [facility] OR;  Service: Vascular;  Laterality: N/A;  . ANTERIOR LUMBAR FUSION N/A 06/25/2018   Procedure: ANTERIOR LUMBAR INTERBODY FUSION LUMBAR 5 - SACRUM 1 WITH INSTRUMENTATION AND ALLOGRAFT;  Surgeon: Phylliss Bob, MD;  Location: [place];  Service: Orthopedics;  Laterality: N/A;  . BREAST BIOPSY  54 years old   cyst removal  . CHOLECYSTECTOMY     Social History   Socioeconomic History  . Marital status: Single    Spouse name: Not on file  . Number of children: Not on file  . Years of education: Not on file  . Highest education level: Not on file  Occupational History  . Occupation: Database administrator: [employer]    Comment: [employer]  Tobacco Use  . Smoking status: Never Smoker  . Smokeless tobacco: Never Used  Substance and Sexual Activity  . Alcohol use: No  . Drug use: No  . Sexual activity: Never    Birth control/protection: None  Other Topics Concern  . Not on file  Social  History Narrative  . Not on file   Social Determinants of Health   Financial Resource Strain:   . Difficulty of Paying Living Expenses: Not on file  Food Insecurity:   . Worried About Charity fundraiser in the Last Year: Not on file  . Ran Out of Food in the Last Year: Not on file  Transportation Needs:   . Lack of Transportation (Medical): Not on file  . Lack of Transportation (Non-Medical): Not on file  Physical Activity:   . Days of Exercise per Week: Not on file  . Minutes of Exercise per Session: Not on file  Stress:   . Feeling of Stress : Not on file  Social Connections:   . Frequency of Communication with Friends and Family: Not on file  . Frequency of Social Gatherings with Friends and Family: Not on file  . Attends Religious Services: Not on file  . Active Member of Clubs or Organizations: Not on file  . Attends Archivist Meetings: Not on file  . Marital Status: Not on file   Family History  Problem Relation Age of Onset  . Colon cancer Maternal Grandmother   . Brain cancer Maternal Grandmother    Allergies  Allergen Reactions  . Shellfish Allergy Anaphylaxis   Prior to Admission medications   Medication Sig Start Date End Date Taking? Authorizing Provider  Ascorbic Acid (VITAMIN C PO) Take 1  tablet by mouth daily.   Yes [provider]  Multiple Vitamin (MULTIVITAMIN WITH MINERALS) TABS tablet Take 1 tablet by mouth daily.   Yes [provider]  diazepam (VALIUM) 5 MG tablet Take 1 tablet (5 mg total) by mouth every 6 (six) hours as needed for muscle spasms. Patient not taking: Reported on 02/17/2019 06/27/18   Phylliss Bob, MD  oxyCODONE-acetaminophen (PERCOCET/ROXICET) 5-325 MG tablet Take 1-2 tablets by mouth every 4 (four) hours as needed for moderate pain or severe pain. Patient not taking: Reported on 02/17/2019 06/27/18   Phylliss Bob, MD     All other systems have been reviewed and were otherwise negative with the exception  of those mentioned in the HPI and as above.  Physical Exam: Vitals:   02/25/19 0628  BP: (!) 154/72  Pulse: 89  Resp: 18  Temp: 98.4 F (36.9 C)  SpO2: 97%    Body mass index is 45.19 kg/m.  General: Alert, no acute distress Cardiovascular: No pedal edema Respiratory: No cyanosis, no use of accessory musculature Skin: No lesions in the area of chief complaint Neurologic: Sensation intact distally Psychiatric: Patient is competent for consent with normal mood and affect Lymphatic: No axillary or cervical lymphadenopathy  MUSCULOSKELETAL: Positive straight leg raise on the right.  The patient does have dorsiflexion weakness on the right as well.  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 Plan for Procedure(s): LUMBAR 5 - SACRUM 1 DECOMPRESSION WITH REMOVAL OF HARDWARE HARDWARE REMOVAL  Of note, the patient's surgery noted above is being performed at a time when the only surgeries appropriate and allowable are surgeries that are considered urgent.  Specifically, the surgeries being performed presently are only surgeries where additional harm may result if the surgery is delayed by an additional 4 weeks.  Given the patient's progressive pain and weakness and severe nerve compression, I do feel that this does applied to this patient.   Norva Karvonen, MD 02/25/2019 8:13 AM

## 2019-02-25 NOTE — Anesthesia Procedure Notes (Signed)
Procedure Name: Intubation Date/Time: 02/25/2019 8:39 AM Performed by: Candis Shine, CRNA Pre-anesthesia Checklist: Patient identified, Emergency Drugs available, Suction available and Patient being monitored Patient Re-evaluated:Patient Re-evaluated prior to induction Oxygen Delivery Method: Circle System Utilized Preoxygenation: Pre-oxygenation with 100% oxygen Induction Type: IV induction Ventilation: Mask ventilation without difficulty and Oral airway inserted - appropriate to patient size Laryngoscope Size: Glidescope and 3 Grade View: Grade I Tube type: Oral Tube size: 7.0 mm Number of attempts: 2 Airway Equipment and Method: Oral airway,  Video-laryngoscopy and Rigid stylet Placement Confirmation: ETT inserted through vocal cords under direct vision,  positive ETCO2 and breath sounds checked- equal and bilateral Secured at: 22 cm Tube secured with: Tape Dental Injury: Teeth and Oropharynx as per pre-operative assessment  Comments: DL x 1 with Mac 3, grade III view. Subsequent glidescope intubation.

## 2019-02-25 NOTE — Op Note (Signed)
PATIENT NAME: Tracy Meyer   MEDICAL RECORD NO.:   [id number]   DATE OF BIRTH: [birth year]   DATE OF PROCEDURE: 02/25/2019                               OPERATIVE REPORT   PREOPERATIVE DIAGNOSES: 1.  Severe right L5 radiculopathy manifesting as progressive right leg pain and weakness 2.  Severe right L5-S1 neuroforaminal stenosis 3.  Status post anterior/posterior L5-S1 fusion with instrumentation  POSTOPERATIVE DIAGNOSES: 1.  Severe right L5 radiculopathy manifesting as progressive right leg pain and weakness 2.  Severe right L5-S1 neuroforaminal stenosis 3.  Status post anterior/posterior L5-S1 fusion with instrumentation  PROCEDURE:   1.  Removal of right-sided L5-S1 instrumentation, including L5 and S1 pedicle screws 2.  L5-S1 decompression, including right-sided full facetectomy 3.  Exploration of L5-S1 spinal fusion  SURGEON:  Phylliss Bob, MD.  ASSISTANTPricilla Holm, PA-C.  ANESTHESIA:  General endotracheal anesthesia.  COMPLICATIONS:  None.  DISPOSITION:  Stable.  ESTIMATED BLOOD LOSS:  Minimal.  INDICATIONS FOR SURGERY:  Briefly, Tracy Meyer is a very pleasant 54- year-old female, who is status post an anterior/posterior L5-S1 fusion procedure.  The patient initially did well, but went on to have progressive right leg pain and weakness.  A postoperative CAT scan and an MRI did reveal appropriate positioning of her hardware, but did reveal ongoing severe right-sided neuroforaminal stenosis, correlating to her symptoms.  Given her progressive pain and weakness, we did discuss proceeding with removal of her instrumentation in combination with a full facetectomy on the right side, with the goal of entirely and thoroughly decompressing the exiting right L5 nerve.  After full understanding of the risks and benefits of surgery, the patient did wish to proceed.  OPERATIVE DETAILS:  On 02/25/2019, the patient was brought to surgery and general endotracheal anesthesia was  administered.  The patient was placed prone on a well-padded flat Jackson bed with a spinal frame. Antibiotics were given and the back was prepped and draped in the usual sterile fashion.  A time-out procedure was performed.  I then made an incision just to the right of the midline, overlying the patient's previous incision.  The fascia was then incised.  A Wiltsie approach was utilized and the patient's previously noted hardware was identified.  The interconnecting rod and caps spanning L5-S1 were removed.  At this point, I subperiosteally exposed the L5-S1 facet joint.  I then proceeded with exploring the spinal fusion.  Using a rigid clamp, I did grab hold of the L5 and S1 pedicle screws, and did perform a pushing and pulling maneuver, carefully evaluating for any motion with the screws or the facet joint.  There was no motion noted.  This did confirm a successful L5-S1 fusion.  At this point, the L5 and S1 pedicle screws were removed. A self-retaining retractor was then placed.  At this point, using a high-speed bur, I did remove the inferior articular process of L5.  Then, meticulously using a Kerrison punch, I did remove the superior articular process of S1. The exiting right L5 nerve was visualized.  I did very thoroughly ensure complete decompression of the exiting right L5 nerve, as it exited beneath the pedicle and lateral to the pedicle.  In order to accomplish this, the L5-S1 facet joint was liberally removed, in addition, per the patient's preoperative CAT scan, there was a bony spur emanating from the superior endplate of  L5 into the region of the foramen.  Using a reverse angled Epstein curette, I did displace these fragments into the intervertebral space, after which point they were removed.  This did result in complete decompression of the exiting right L5 nerve. A Woodson probe was used to confirm thorough and complete decompression of the exiting right L5 nerve.  At this point, the wound was  copiously irrigated.  40 mg of Depo-Medrol was introduced in the region of the exiting right L5 nerve.  At this point, the fascia was closed using #1 Vicryl, and the subcutaneous tissue was closed using 2-0 Vicryl, and the skin was closed using 4-0 Monocryl.  Benzoin and Steri-Strips were applied, followed by a sterile dressing.  All instrument counts were correct at the termination of the procedure.  Of note, Pricilla Holm, PA-C, was my assistant throughout surgery, and did aid in retraction, suctioning, removal of the hardware, and closure from start to finish.   Phylliss Bob, MD

## 2019-02-25 NOTE — Anesthesia Postprocedure Evaluation (Signed)
Anesthesia Post Note  Patient: Tracy Meyer  Procedure(s) Performed: LUMBAR 5 - SACRUM 1 DECOMPRESSION WITH REMOVAL OF HARDWARE (N/A ) HARDWARE REMOVAL (N/A )     Patient location during evaluation: PACU Anesthesia Type: General Level of consciousness: sedated Pain management: pain level controlled Vital Signs Assessment: post-procedure vital signs reviewed and stable Respiratory status: spontaneous breathing Cardiovascular status: stable Postop Assessment: no apparent nausea or vomiting Anesthetic complications: no    Last Vitals:  Vitals:   02/25/19 1208 02/25/19 1236  BP: (!) 171/72 (!) 153/85  Pulse: 84 84  Resp: (!) 22 (!) 22  Temp:    SpO2: 93% 91%    Last Pain:  Vitals:   02/25/19 1236  TempSrc:   PainSc: Asleep   Pain Goal: Patients Stated Pain Goal: 0 (02/25/19 0650)                 Huston Foley

## 2019-02-26 MED FILL — Thrombin For Soln Kit 20000 Unit: CUTANEOUS | Qty: 1 | Status: AC

## 2019-03-23 ENCOUNTER — Ambulatory Visit: Payer: Managed Care, Other (non HMO) | Attending: Internal Medicine

## 2019-03-23 ENCOUNTER — Other Ambulatory Visit: Payer: Managed Care, Other (non HMO)

## 2019-03-23 DIAGNOSIS — Z20822 Contact with and (suspected) exposure to covid-19: Secondary | ICD-10-CM

## 2019-03-24 LAB — NOVEL CORONAVIRUS, NAA: SARS-CoV-2, NAA: NOT DETECTED

## 2019-04-24 ENCOUNTER — Ambulatory Visit: Payer: Managed Care, Other (non HMO) | Attending: Internal Medicine

## 2019-04-24 DIAGNOSIS — Z20822 Contact with and (suspected) exposure to covid-19: Secondary | ICD-10-CM

## 2019-04-25 LAB — NOVEL CORONAVIRUS, NAA: SARS-CoV-2, NAA: NOT DETECTED

## 2019-04-25 LAB — SARS-COV-2, NAA 2 DAY TAT

## 2019-04-29 ENCOUNTER — Ambulatory Visit: Payer: Managed Care, Other (non HMO) | Attending: Internal Medicine

## 2019-05-21 ENCOUNTER — Other Ambulatory Visit: Payer: Self-pay | Admitting: Internal Medicine

## 2019-05-21 DIAGNOSIS — Z1231 Encounter for screening mammogram for malignant neoplasm of breast: Secondary | ICD-10-CM

## 2019-06-10 ENCOUNTER — Ambulatory Visit
Admission: RE | Admit: 2019-06-10 | Discharge: 2019-06-10 | Disposition: A | Discharge status: Home / Self Care | Payer: Managed Care, Other (non HMO) | Source: Ambulatory Visit | Attending: Internal Medicine | Admitting: Internal Medicine

## 2019-06-10 ENCOUNTER — Other Ambulatory Visit: Payer: Self-pay

## 2019-06-10 DIAGNOSIS — Z1231 Encounter for screening mammogram for malignant neoplasm of breast: Secondary | ICD-10-CM

## 2019-08-12 ENCOUNTER — Other Ambulatory Visit: Payer: Self-pay | Admitting: Gastroenterology

## 2019-10-01 NOTE — Progress Notes (Signed)
Attempted to obtain medical history via telephone, unable to reach at this time. I left a voicemail to return pre surgical testing department's phone call.  

## 2019-10-04 ENCOUNTER — Emergency Department (HOSPITAL_COMMUNITY)
Admission: EM | Admit: 2019-10-04 | Discharge: 2019-10-05 | Disposition: A | Discharge status: Home / Self Care | Payer: Managed Care, Other (non HMO) | Attending: Emergency Medicine | Admitting: Emergency Medicine

## 2019-10-04 ENCOUNTER — Other Ambulatory Visit: Payer: Self-pay

## 2019-10-04 ENCOUNTER — Encounter (HOSPITAL_COMMUNITY): Payer: Self-pay

## 2019-10-04 DIAGNOSIS — U071 COVID-19: Secondary | ICD-10-CM

## 2019-10-04 DIAGNOSIS — R42 Dizziness and giddiness: Secondary | ICD-10-CM | POA: Insufficient documentation

## 2019-10-04 DIAGNOSIS — R509 Fever, unspecified: Secondary | ICD-10-CM | POA: Diagnosis present

## 2019-10-04 LAB — CBC
HCT: 40.7 % (ref 36.0–46.0)
Hemoglobin: 13.2 g/dL (ref 12.0–15.0)
MCH: 30.7 pg (ref 26.0–34.0)
MCHC: 32.4 g/dL (ref 30.0–36.0)
MCV: 94.7 fL (ref 80.0–100.0)
Platelets: 202 10*3/uL (ref 150–400)
RBC: 4.3 MIL/uL (ref 3.87–5.11)
RDW: 14.2 % (ref 11.5–15.5)
WBC: 6.7 10*3/uL (ref 4.0–10.5)
nRBC: 0 % (ref 0.0–0.2)

## 2019-10-04 LAB — BASIC METABOLIC PANEL
Anion gap: 10 (ref 5–15)
BUN: 7 mg/dL (ref 6–20)
CO2: 27 mmol/L (ref 22–32)
Calcium: 8.3 mg/dL — ABNORMAL LOW (ref 8.9–10.3)
Chloride: 100 mmol/L (ref 98–111)
Creatinine, Ser: 1.04 mg/dL — ABNORMAL HIGH (ref 0.44–1.00)
GFR calc Af Amer: >60 mL/min (ref >60)
GFR calc non Af Amer: >60 mL/min (ref >60)
Glucose, Bld: 116 mg/dL — ABNORMAL HIGH (ref 70–99)
Potassium: 3.7 mmol/L (ref 3.5–5.1)
Sodium: 137 mmol/L (ref 135–145)

## 2019-10-04 LAB — I-STAT BETA HCG BLOOD, ED (MC, WL, AP ONLY): I-stat hCG, quantitative: <5 m[IU]/mL (ref <5)

## 2019-10-04 LAB — CBG MONITORING, ED: Glucose-Capillary: 122 mg/dL — ABNORMAL HIGH (ref 70–99)

## 2019-10-04 MED ORDER — ACETAMINOPHEN 325 MG PO TABS
650.0000 mg | ORAL_TABLET | Freq: Once | ORAL | Status: AC | PRN
  Administered 2019-10-04: 650 mg via ORAL
  Filled 2019-10-04: qty 2

## 2019-10-04 NOTE — ED Triage Notes (Signed)
Patient arrived stating she has had a fever that started today. Reports taking OTC medication last at 2pm. Complaints of a headache and lightheadedness.

## 2019-10-04 NOTE — ED Notes (Signed)
Blue and gold top (2) sent to lab.

## 2019-10-05 ENCOUNTER — Emergency Department (HOSPITAL_COMMUNITY): Payer: Managed Care, Other (non HMO)

## 2019-10-05 LAB — URINALYSIS, ROUTINE W REFLEX MICROSCOPIC
Bilirubin Urine: NEGATIVE
Glucose, UA: NEGATIVE mg/dL
Hgb urine dipstick: NEGATIVE
Ketones, ur: NEGATIVE mg/dL
Leukocytes,Ua: NEGATIVE
Nitrite: NEGATIVE
Protein, ur: NEGATIVE mg/dL
Specific Gravity, Urine: 1.01 (ref 1.005–1.030)
pH: 6 (ref 5.0–8.0)

## 2019-10-05 LAB — SARS CORONAVIRUS 2 BY RT PCR (HOSPITAL ORDER, PERFORMED IN ~~LOC~~ HOSPITAL LAB): SARS Coronavirus 2: POSITIVE — AB

## 2019-10-05 MED ORDER — SODIUM CHLORIDE 0.9 % IV SOLN
1200.0000 mg | Freq: Once | INTRAVENOUS | Status: AC
  Administered 2019-10-05: 1200 mg via INTRAVENOUS
  Filled 2019-10-05: qty 10

## 2019-10-05 MED ORDER — ACETAMINOPHEN 325 MG PO TABS
650.0000 mg | ORAL_TABLET | Freq: Once | ORAL | Status: AC | PRN
  Administered 2019-10-05: 650 mg via ORAL
  Filled 2019-10-05: qty 2

## 2019-10-05 MED ORDER — DIPHENHYDRAMINE HCL 50 MG/ML IJ SOLN: 50.0000 mg | Freq: Once | INTRAMUSCULAR | Status: DC | PRN

## 2019-10-05 MED ORDER — ALBUTEROL SULFATE HFA 108 (90 BASE) MCG/ACT IN AERS: 2.0000 | INHALATION_SPRAY | Freq: Once | RESPIRATORY_TRACT | Status: DC | PRN

## 2019-10-05 MED ORDER — EPINEPHRINE 0.3 MG/0.3ML IJ SOAJ: 0.3000 mg | Freq: Once | INTRAMUSCULAR | Status: DC | PRN

## 2019-10-05 MED ORDER — ONDANSETRON HCL 8 MG PO TABS: 8.0000 mg | ORAL_TABLET | Freq: Three times a day (TID) | ORAL | 0 refills | Status: DC | PRN

## 2019-10-05 MED ORDER — FAMOTIDINE IN NACL 20-0.9 MG/50ML-% IV SOLN: 20.0000 mg | Freq: Once | INTRAVENOUS | Status: DC | PRN

## 2019-10-05 MED ORDER — METHYLPREDNISOLONE SODIUM SUCC 125 MG IJ SOLR: 125.0000 mg | Freq: Once | INTRAMUSCULAR | Status: DC | PRN

## 2019-10-05 MED ORDER — SODIUM CHLORIDE 0.9 % IV SOLN: INTRAVENOUS | Status: DC | PRN

## 2019-10-05 NOTE — ED Provider Notes (Signed)
[facility] DEPT Provider Note: Georgena Spurling, MD, FACEP  CSN: [id number] MRN: [id number] ARRIVAL: 10/04/19 at [place]: [facility]  Fever and Near Syncope   HISTORY OF PRESENT ILLNESS  10/05/19 1:35 AM Tracy Meyer is a 54 y.o. female whose daughter was recently diagnosed with Covid.  She is here with 5 days of a cough.  2 days ago she developed a fever and shortness of breath with lightheadedness and headache.  Her fever was noted to be as high as 103 here and she was given acetaminophen.  She has been taking over-the-counter cough/cold medications at home without adequate relief.  Her symptoms are moderate.  She is not hypoxic.  She denies nasal congestion, sore throat, loss of taste or smell, nausea, vomiting or diarrhea.  She rates her headache as a 10 out of 10.  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  She is having postoperative back pain which is no worse than it had been.  Past Medical History:  Diagnosis Date  . Pneumonia   . PONV (postoperative nausea and vomiting)     Past Surgical History:  Procedure Laterality Date  . ABDOMINAL EXPOSURE N/A 06/25/2018   Procedure: ABDOMINAL EXPOSURE;  Surgeon: Rosetta Posner, MD;  Location: [facility] OR;  Service: Vascular;  Laterality: N/A;  . ANTERIOR LUMBAR FUSION N/A 06/25/2018   Procedure: ANTERIOR LUMBAR INTERBODY FUSION LUMBAR 5 - SACRUM 1 WITH INSTRUMENTATION AND ALLOGRAFT;  Surgeon: Phylliss Bob, MD;  Location: [facility];  Service: Orthopedics;  Laterality: N/A;  . BREAST BIOPSY  54 years old   cyst removal  . CHOLECYSTECTOMY    . HARDWARE REMOVAL N/A 02/25/2019   Procedure: HARDWARE REMOVAL;  Surgeon: Phylliss Bob, MD;  Location: [place];  Service: Orthopedics;  Laterality: N/A;  . LUMBAR LAMINECTOMY/DECOMPRESSION MICRODISCECTOMY N/A 02/25/2019   Procedure: LUMBAR 5 - SACRUM 1 DECOMPRESSION WITH REMOVAL OF HARDWARE;  Surgeon: Phylliss Bob, MD;  Location:  [place];  Service: Orthopedics;  Laterality: N/A;    Family History  Problem Relation Age of Onset  . Colon cancer Maternal Grandmother   . Brain cancer Maternal Grandmother     Social History   Tobacco Use  . Smoking status: Never Smoker  . Smokeless tobacco: Never Used  Vaping Use  . Vaping Use: Never used  Substance Use Topics  . Alcohol use: No  . Drug use: No    Prior to Admission medications   Medication Sig Start Date End Date Taking? Authorizing Provider  Ascorbic Acid (VITAMIN C PO) Take 1 tablet by mouth daily.    [provider]  methocarbamol (ROBAXIN) 500 MG tablet Take 1 tablet (500 mg total) by mouth every 6 (six) hours as needed for muscle spasms. 02/25/19   McKenzie, Lennie Muckle, PA-C  Multiple Vitamin (MULTIVITAMIN WITH MINERALS) TABS tablet Take 1 tablet by mouth daily.    [provider]    Allergies Shellfish allergy   REVIEW OF SYSTEMS  Negative except as noted here or in the History of Present Illness.   PHYSICAL EXAMINATION  Initial Vital Signs Blood pressure 132/75, pulse (!) 101, temperature (!) 101.6 F (38.7 C), temperature source Oral, resp. rate (!) 34, height 5\' 6"  (1.676 m), weight 127 kg, last menstrual period 04/15/2014, SpO2 95 %.  Examination General: Well-developed, well-nourished female in no acute distress; appearance consistent with age of record HENT: normocephalic; atraumatic Eyes: pupils equal, round and reactive to light;  extraocular muscles intact Neck: supple Heart: regular rate and rhythm Lungs: clear to auscultation bilaterally; tachypnea Abdomen: soft; nondistended; nontender; bowel sounds present Extremities: No deformity; full range of motion; pulses normal Neurologic: Awake, alert and oriented; motor function intact in all extremities and symmetric; no facial droop Skin: Warm and dry Psychiatric: Normal mood and affect   RESULTS  Summary of this visit's results, reviewed and interpreted by  myself:   EKG Interpretation  Date/Time:  Sunday October 04 2019 20:33:34 EDT Ventricular Rate:  124 PR Interval:    QRS Duration: 129 QT Interval:  331 QTC Calculation: 476 R Axis:   176 Text Interpretation: Sinus tachycardia Right bundle branch block Confirmed by Shiquan Mathieu [phone]) on 10/05/2019 12:31:52 AM      Laboratory Studies: Results for orders placed or performed during the hospital encounter of 10/04/19 (from the past 24 hour(s))  Urinalysis, Routine w reflex microscopic Urine, Clean Catch     Status: None   Collection Time: 10/04/19  8:25 PM  Result Value Ref Range   Color, Urine YELLOW YELLOW   APPearance CLEAR CLEAR   Specific Gravity, Urine 1.010 1.005 - 1.030   pH 6.0 5.0 - 8.0   Glucose, UA NEGATIVE NEGATIVE mg/dL   Hgb urine dipstick NEGATIVE NEGATIVE   Bilirubin Urine NEGATIVE NEGATIVE   Ketones, ur NEGATIVE NEGATIVE mg/dL   Protein, ur NEGATIVE NEGATIVE mg/dL   Nitrite NEGATIVE NEGATIVE   Leukocytes,Ua NEGATIVE NEGATIVE  SARS Coronavirus 2 by RT PCR (hospital order, performed in [place] hospital lab) Nasopharyngeal Nasopharyngeal Swab     Status: Abnormal   Collection Time: 10/04/19  8:33 PM   Specimen: Nasopharyngeal Swab  Result Value Ref Range   SARS Coronavirus 2 POSITIVE (A) NEGATIVE  CBG monitoring, ED     Status: Abnormal   Collection Time: 10/04/19  8:49 PM  Result Value Ref Range   Glucose-Capillary 122 (H) 70 - 99 mg/dL  Basic metabolic panel     Status: Abnormal   Collection Time: 10/04/19  8:51 PM  Result Value Ref Range   Sodium 137 135 - 145 mmol/L   Potassium 3.7 3.5 - 5.1 mmol/L   Chloride 100 98 - 111 mmol/L   CO2 27 22 - 32 mmol/L   Glucose, Bld 116 (H) 70 - 99 mg/dL   BUN 7 6 - 20 mg/dL   Creatinine, Ser 1.04 (H) 0.44 - 1.00 mg/dL   Calcium 8.3 (L) 8.9 - 10.3 mg/dL   GFR calc non Af Amer >60 >60 mL/min   GFR calc Af Amer >60 >60 mL/min   Anion gap 10 5 - 15  CBC     Status: None   Collection Time: 10/04/19  8:51 PM   Result Value Ref Range   WBC 6.7 4.0 - 10.5 K/uL   RBC 4.30 3.87 - 5.11 MIL/uL   Hemoglobin 13.2 12.0 - 15.0 g/dL   HCT 40.7 36 - 46 %   MCV 94.7 80.0 - 100.0 fL   MCH 30.7 26.0 - 34.0 pg   MCHC 32.4 30.0 - 36.0 g/dL   RDW 14.2 11.5 - 15.5 %   Platelets 202 150 - 400 K/uL   nRBC 0.0 0.0 - 0.2 %  I-Stat beta hCG blood, ED     Status: None   Collection Time: 10/04/19  9:02 PM  Result Value Ref Range   I-stat hCG, quantitative <5.0 <5 mIU/mL   Comment 3           Imaging  Studies: DG Chest Port 1 View  Result Date: 10/05/2019 CLINICAL DATA:  COVID positive. EXAM: PORTABLE CHEST 1 VIEW COMPARISON:  March 14, 2016 FINDINGS: There is no evidence of acute infiltrate, pleural effusion or pneumothorax. The heart size and mediastinal contours are within normal limits. There is mild to moderate severity calcification of the aortic arch. The visualized skeletal structures are unremarkable. IMPRESSION: No active cardiopulmonary disease. Electronically Signed   By: Virgina Norfolk M.D.   On: 10/05/2019 01:16    ED COURSE and MDM  Nursing notes, initial and subsequent vitals signs, including pulse oximetry, reviewed and interpreted by myself.  Vitals:   10/05/19 0215 10/05/19 0219 10/05/19 0230 10/05/19 0245  BP: (!) 115/52  131/72   Pulse: 98  98   Resp: (!) 25  (!) 26   Temp:  (!) 100.9 F (38.3 C) (!) 101.4 F (38.6 C) (!) 101.5 F (38.6 C)  TempSrc:  Oral Oral Oral  SpO2: 93%  94%   Weight:      Height:       Medications  0.9 %  sodium chloride infusion (has no administration in time range)  diphenhydrAMINE (BENADRYL) injection 50 mg (has no administration in time range)  famotidine (PEPCID) IVPB 20 mg premix (has no administration in time range)  methylPREDNISolone sodium succinate (SOLU-MEDROL) 125 mg/2 mL injection 125 mg (has no administration in time range)  albuterol (VENTOLIN HFA) 108 (90 Base) MCG/ACT inhaler 2 puff (has no administration in time range)  EPINEPHrine  (EPI-PEN) injection 0.3 mg (has no administration in time range)  acetaminophen (TYLENOL) tablet 650 mg (650 mg Oral Given 10/04/19 2031)  casirivimab-imdevimab (REGEN-COV) 1,200 mg in sodium chloride 0.9 % 110 mL IVPB (0 mg Intravenous Stopped 10/05/19 0245)  acetaminophen (TYLENOL) tablet 650 mg (650 mg Oral Given 10/05/19 0225)   The patient does not meet admission criteria at this time but she does meet criteria for monoclonal antibody infusion.  The patient consents to this infusion in the ED.   PROCEDURES  Procedures   ED DIAGNOSES     ICD-10-CM   1. COVID-19 virus infection  U07.1        Allix Blomquist, MD 10/05/19 [phone]

## 2019-10-05 NOTE — ED Notes (Signed)
Pt reports not itching or discomfort; pt is not showing any signs of a reaction to the casirivimab-imdevimab

## 2019-10-05 NOTE — ED Notes (Signed)
Casirivimab-imdevimab verified by Fredonia Highland, RN for a dual sign off

## 2019-10-11 ENCOUNTER — Encounter (HOSPITAL_COMMUNITY): Payer: Self-pay | Admitting: *Deleted

## 2019-10-11 ENCOUNTER — Emergency Department (HOSPITAL_COMMUNITY)
Admission: EM | Admit: 2019-10-11 | Discharge: 2019-10-11 | Disposition: A | Discharge status: Home / Self Care | Payer: Managed Care, Other (non HMO) | Attending: Emergency Medicine | Admitting: Emergency Medicine

## 2019-10-11 ENCOUNTER — Other Ambulatory Visit: Payer: Self-pay

## 2019-10-11 DIAGNOSIS — H1033 Unspecified acute conjunctivitis, bilateral: Secondary | ICD-10-CM | POA: Diagnosis present

## 2019-10-11 MED ORDER — POLYMYXIN B-TRIMETHOPRIM 10000-0.1 UNIT/ML-% OP SOLN
1.0000 [drp] | Freq: Four times a day (QID) | OPHTHALMIC | Status: DC
  Administered 2019-10-11: 1 [drp] via OPHTHALMIC
  Filled 2019-10-11: qty 10

## 2019-10-11 MED ORDER — TETRACAINE HCL 0.5 % OP SOLN
2.0000 [drp] | Freq: Once | OPHTHALMIC | Status: AC
  Administered 2019-10-11: 2 [drp] via OPHTHALMIC
  Filled 2019-10-11: qty 4

## 2019-10-11 MED ORDER — FLUORESCEIN SODIUM 1 MG OP STRP
1.0000 | ORAL_STRIP | Freq: Once | OPHTHALMIC | Status: AC
  Administered 2019-10-11: 1 via OPHTHALMIC
  Filled 2019-10-11: qty 1

## 2019-10-11 NOTE — ED Triage Notes (Signed)
Pt started to have eye bil eye pain last night, eyes pink. Tested + covid this past week

## 2019-10-11 NOTE — ED Provider Notes (Signed)
[facility] DEPT Provider Note   CSN: [id number] Arrival date & time: 10/11/19  1215     History Chief Complaint  Patient presents with  . Eye Pain  . Covid Positive    Tracy Meyer is a 54 y.o. female.  The history is provided by the patient.  Eye Pain This is a new problem. The current episode started yesterday. The problem occurs constantly. The problem has been gradually worsening. Associated symptoms comments: Pain, itching, redness and crusting of both eyes starting yesterday.  Mild blurry vision more than baseline.  NO photophobia or trauma.  No FB in the eyes.  She does wear contacts but has been out and hasn't worn them for the last 4 months.  No known sick contacts but she tested covid positive last week. Nothing aggravates the symptoms. Nothing relieves the symptoms. She has tried nothing for the symptoms. The treatment provided no relief.       Past Medical History:  Diagnosis Date  . Pneumonia   . PONV (postoperative nausea and vomiting)     Patient Active Problem List   Diagnosis Date Noted  . Radiculopathy 06/25/2018  . Acute respiratory failure ([place]) 04/18/2015  . 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 ABDOMINAL EXPOSURE N/A 06/25/2018   Procedure: ABDOMINAL EXPOSURE;  Surgeon: Rosetta Posner, MD;  Location: [facility] OR;  Service: Vascular;  Laterality: N/A;  . ANTERIOR LUMBAR FUSION N/A 06/25/2018   Procedure: ANTERIOR LUMBAR INTERBODY FUSION LUMBAR 5 - SACRUM 1 WITH INSTRUMENTATION AND ALLOGRAFT;  Surgeon: Phylliss Bob, MD;  Location: [facility];  Service: Orthopedics;  Laterality: N/A;  . BREAST BIOPSY  54 years old   cyst removal  . CHOLECYSTECTOMY    . HARDWARE REMOVAL N/A 02/25/2019   Procedure: HARDWARE REMOVAL;  Surgeon: Phylliss Bob, MD;  Location: [place];  Service: Orthopedics;  Laterality: N/A;  . LUMBAR LAMINECTOMY/DECOMPRESSION MICRODISCECTOMY N/A 02/25/2019    Procedure: LUMBAR 5 - SACRUM 1 DECOMPRESSION WITH REMOVAL OF HARDWARE;  Surgeon: Phylliss Bob, MD;  Location: [place];  Service: Orthopedics;  Laterality: N/A;     OB History   No obstetric history on file.     Family History  Problem Relation Age of Onset  . Colon cancer Maternal Grandmother   . Brain cancer Maternal Grandmother     Social History   Tobacco Use  . Smoking status: Never Smoker  . Smokeless tobacco: Never Used  Vaping Use  . Vaping Use: Never used  Substance Use Topics  . Alcohol use: No  . Drug use: No    Home Medications Prior to Admission medications   Medication Sig Start Date End Date Taking? Authorizing Provider  Ascorbic Acid (VITAMIN C PO) Take 1 tablet by mouth daily.    [provider]  methocarbamol (ROBAXIN) 500 MG tablet Take 1 tablet (500 mg total) by mouth every 6 (six) hours as needed for muscle spasms. 02/25/19   McKenzie, Lennie Muckle, PA-C  Multiple Vitamin (MULTIVITAMIN WITH MINERALS) TABS tablet Take 1 tablet by mouth daily.    [provider]  ondansetron (ZOFRAN) 8 MG tablet Take 1 tablet (8 mg total) by mouth every 8 (eight) hours as needed for nausea or vomiting. 10/05/19   Molpus, Jenny Reichmann, MD    Allergies    Shellfish allergy  Review of Systems   Review of Systems  Eyes: Positive for pain.  All other systems reviewed and are negative.  Physical Exam Updated Vital Signs BP (!) 150/97 (BP Location: Left Arm)   Pulse 71   Temp 98.5 F (36.9 C) (Oral)   Resp 18   Ht 5\' 6"  (1.676 m)   Wt 127 kg   LMP 04/15/2014   SpO2 97%   BMI 45.19 kg/m   Physical Exam Constitutional:      General: She is not in acute distress.    Appearance: Normal appearance. She is obese.  HENT:     Head: Normocephalic.     Nose: Nose normal.     Mouth/Throat:     Mouth: Mucous membranes are moist.  Eyes:     General: Lids are normal. Vision grossly intact. No visual field deficit.       Right eye: Discharge present. No foreign  body.        Left eye: No foreign body or discharge.     Extraocular Movements: Extraocular movements intact.     Conjunctiva/sclera:     Right eye: Right conjunctiva is injected. No exudate.    Left eye: Left conjunctiva is injected. No exudate.    Pupils: Pupils are equal, round, and reactive to light.     Right eye: No corneal abrasion or fluorescein uptake.     Left eye: No corneal abrasion or fluorescein uptake.     Slit lamp exam:    Right eye: Corneal flare present. No corneal ulcer.     Left eye: Corneal flare present. No corneal ulcer.  Cardiovascular:     Rate and Rhythm: Normal rate.  Pulmonary:     Effort: Pulmonary effort is normal.  Skin:    General: Skin is warm.  Neurological:     General: No focal deficit present.     Mental Status: She is alert and oriented to person, place, and time. Mental status is at baseline.  Psychiatric:        Mood and Affect: Mood normal.        Behavior: Behavior normal.        Thought Content: Thought content normal.     ED Results / Procedures / Treatments   Labs (all labs ordered are listed, but only abnormal results are displayed) Labs Reviewed - No data to display  EKG None  Radiology No results found.  Procedures Procedures (including critical care time)  Medications Ordered in ED Medications  trimethoprim-polymyxin b (POLYTRIM) ophthalmic solution 1 drop (has no administration in time range)  tetracaine (PONTOCAINE) 0.5 % ophthalmic solution 2 drop (2 drops Both Eyes Given by Other 10/11/19 1403)  fluorescein ophthalmic strip 1 strip (1 strip Both Eyes Given by Other 10/11/19 1404)    ED Course  I have reviewed the triage vital signs and the nursing notes.  Pertinent labs & imaging results that were available during my care of the patient were reviewed by me and considered in my medical decision making (see chart for details).    MDM Rules/Calculators/A&P                          Patient presenting with  symptoms most suggestive of bilateral conjunctivitis.  This is most likely viral as she tested positive for Covid last week.  She cannot think of any recent exposure that would cause this to be allergic.  She has some mild exudate but no excessive purulent drainage.  No known sick contacts.  Vision is intact.  Patient is a contact lens wearer but has not  worn them in 4 months and no evidence of corneal ulcer or abrasion on fluorescein staining today.  Will give Polytrim in case this is possibly bacterial but cautioned the patient is most likely viral and will get better on its own.  Did recommend she follow-up with her eye doctor Dr. Idolina Primer later this week if symptoms are not improving.  Final Clinical Impression(s) / ED Diagnoses Final diagnoses:  Acute conjunctivitis of both eyes, unspecified acute conjunctivitis type    Rx / DC Orders ED Discharge Orders    None       Blanchie Dessert, MD 10/11/19 1440

## 2019-10-11 NOTE — Discharge Instructions (Signed)
The pinkeye you have today is most likely viral from Covid however there is a possibility that it could be bacterial.  You can use these drops 4 times a day in each eye however if it is viral it should get better on its own.  You can use artificial tears as well to help with some of the itching and burning.  Avoid sharing any type of towels and try not to touch her face.  Do not use any contacts at this time.  If your symptoms are not improving speak with Dr. Trish Fountain he can follow-up for reevaluation.

## 2019-10-12 ENCOUNTER — Other Ambulatory Visit (HOSPITAL_COMMUNITY): Payer: Managed Care, Other (non HMO)

## 2019-10-15 ENCOUNTER — Encounter (HOSPITAL_COMMUNITY): Payer: Self-pay

## 2019-10-15 ENCOUNTER — Ambulatory Visit (HOSPITAL_COMMUNITY): Admit: 2019-10-15 | Payer: Managed Care, Other (non HMO) | Admitting: Gastroenterology

## 2019-10-15 SURGERY — COLONOSCOPY WITH PROPOFOL
Anesthesia: Monitor Anesthesia Care

## 2019-12-29 ENCOUNTER — Other Ambulatory Visit (HOSPITAL_COMMUNITY)
Admission: RE | Admit: 2019-12-29 | Discharge: 2019-12-29 | Disposition: A | Discharge status: Home / Self Care | Payer: Managed Care, Other (non HMO) | Source: Ambulatory Visit | Attending: Gastroenterology | Admitting: Gastroenterology

## 2019-12-29 DIAGNOSIS — Z01812 Encounter for preprocedural laboratory examination: Secondary | ICD-10-CM | POA: Insufficient documentation

## 2019-12-29 DIAGNOSIS — Z20822 Contact with and (suspected) exposure to covid-19: Secondary | ICD-10-CM | POA: Diagnosis not present

## 2019-12-29 LAB — SARS CORONAVIRUS 2 (TAT 6-24 HRS): SARS Coronavirus 2: NEGATIVE

## 2019-12-29 NOTE — Progress Notes (Signed)
Attempted to obtain medical history via telephone, unable to reach at this time. I left a voicemail to return pre surgical testing department's phone call.  

## 2019-12-30 ENCOUNTER — Other Ambulatory Visit: Payer: Self-pay

## 2019-12-30 ENCOUNTER — Encounter (HOSPITAL_COMMUNITY): Payer: Self-pay | Admitting: Gastroenterology

## 2020-01-01 ENCOUNTER — Encounter (HOSPITAL_COMMUNITY): Payer: Self-pay | Admitting: Gastroenterology

## 2020-01-01 ENCOUNTER — Encounter (HOSPITAL_COMMUNITY)
Admission: RE | Disposition: A | Discharge status: Home / Self Care | Payer: Self-pay | Source: Home / Self Care | Attending: Gastroenterology

## 2020-01-01 ENCOUNTER — Ambulatory Visit (HOSPITAL_COMMUNITY)
Admission: RE | Admit: 2020-01-01 | Discharge: 2020-01-01 | Disposition: A | Discharge status: Home / Self Care | Payer: Managed Care, Other (non HMO) | Attending: Gastroenterology | Admitting: Gastroenterology

## 2020-01-01 ENCOUNTER — Ambulatory Visit (HOSPITAL_COMMUNITY): Payer: Managed Care, Other (non HMO) | Admitting: Certified Registered Nurse Anesthetist

## 2020-01-01 ENCOUNTER — Other Ambulatory Visit: Payer: Self-pay

## 2020-01-01 DIAGNOSIS — K5730 Diverticulosis of large intestine without perforation or abscess without bleeding: Secondary | ICD-10-CM | POA: Insufficient documentation

## 2020-01-01 DIAGNOSIS — K648 Other hemorrhoids: Secondary | ICD-10-CM | POA: Insufficient documentation

## 2020-01-01 DIAGNOSIS — Z1211 Encounter for screening for malignant neoplasm of colon: Secondary | ICD-10-CM | POA: Diagnosis present

## 2020-01-01 DIAGNOSIS — K2970 Gastritis, unspecified, without bleeding: Secondary | ICD-10-CM | POA: Insufficient documentation

## 2020-01-01 DIAGNOSIS — K2950 Unspecified chronic gastritis without bleeding: Secondary | ICD-10-CM | POA: Insufficient documentation

## 2020-01-01 DIAGNOSIS — R12 Heartburn: Secondary | ICD-10-CM | POA: Insufficient documentation

## 2020-01-01 DIAGNOSIS — Z9049 Acquired absence of other specified parts of digestive tract: Secondary | ICD-10-CM | POA: Insufficient documentation

## 2020-01-01 DIAGNOSIS — Z800 Family history of malignant neoplasm of digestive organs: Secondary | ICD-10-CM | POA: Diagnosis not present

## 2020-01-01 DIAGNOSIS — Z79899 Other long term (current) drug therapy: Secondary | ICD-10-CM | POA: Insufficient documentation

## 2020-01-01 DIAGNOSIS — K222 Esophageal obstruction: Secondary | ICD-10-CM | POA: Diagnosis not present

## 2020-01-01 DIAGNOSIS — K449 Diaphragmatic hernia without obstruction or gangrene: Secondary | ICD-10-CM | POA: Insufficient documentation

## 2020-01-01 DIAGNOSIS — K219 Gastro-esophageal reflux disease without esophagitis: Secondary | ICD-10-CM | POA: Insufficient documentation

## 2020-01-01 DIAGNOSIS — Z6841 Body Mass Index (BMI) 40.0 and over, adult: Secondary | ICD-10-CM | POA: Insufficient documentation

## 2020-01-01 DIAGNOSIS — D120 Benign neoplasm of cecum: Secondary | ICD-10-CM | POA: Insufficient documentation

## 2020-01-01 HISTORY — PX: COLONOSCOPY WITH PROPOFOL: SHX5780

## 2020-01-01 HISTORY — PX: POLYPECTOMY: SHX5525

## 2020-01-01 HISTORY — PX: ESOPHAGOGASTRODUODENOSCOPY (EGD) WITH PROPOFOL: SHX5813

## 2020-01-01 HISTORY — PX: BIOPSY: SHX5522

## 2020-01-01 HISTORY — DX: Presence of spectacles and contact lenses: Z97.3

## 2020-01-01 SURGERY — COLONOSCOPY WITH PROPOFOL
Anesthesia: Monitor Anesthesia Care

## 2020-01-01 MED ORDER — SODIUM CHLORIDE 0.9 % IV SOLN: INTRAVENOUS | Status: DC

## 2020-01-01 MED ORDER — ONDANSETRON HCL 4 MG/2ML IJ SOLN
INTRAMUSCULAR | Status: DC | PRN
  Administered 2020-01-01: 4 mg via INTRAVENOUS

## 2020-01-01 MED ORDER — LACTATED RINGERS IV SOLN: INTRAVENOUS | Status: DC

## 2020-01-01 MED ORDER — PROPOFOL 10 MG/ML IV BOLUS
INTRAVENOUS | Status: AC
  Filled 2020-01-01: qty 20

## 2020-01-01 MED ORDER — PROPOFOL 500 MG/50ML IV EMUL
INTRAVENOUS | Status: DC | PRN
  Administered 2020-01-01 (×3): 50 mg via INTRAVENOUS

## 2020-01-01 MED ORDER — PROPOFOL 500 MG/50ML IV EMUL
INTRAVENOUS | Status: DC | PRN
  Administered 2020-01-01: 150 ug/kg/min via INTRAVENOUS

## 2020-01-01 SURGICAL SUPPLY — 25 items

## 2020-01-01 NOTE — H&P (Signed)
Primary Care Physician:  Wenda Low, MD Primary Gastroenterologist:  Dr. Alessandra Bevels  Reason for Visit: EGD and colonoscopy  HPI: Tracy Meyer is a 54 y.o. female with past medical history of morbid obesity and chronic reflux here for EGD and screening colonoscopy. Patient with history of chronic reflux. Symptoms improving with diet modification. Denies any other GI symptoms. No previous colonoscopy. No previous EGD.  Past Medical History:  Diagnosis Date  . Pneumonia   . PONV (postoperative nausea and vomiting)   . Wears contact lenses   . Wears glasses     Past Surgical History:  Procedure Laterality Date  . ABDOMINAL EXPOSURE N/A 06/25/2018   Procedure: ABDOMINAL EXPOSURE;  Surgeon: Rosetta Posner, MD;  Location: [facility] OR;  Service: Vascular;  Laterality: N/A;  . ANTERIOR LUMBAR FUSION N/A 06/25/2018   Procedure: ANTERIOR LUMBAR INTERBODY FUSION LUMBAR 5 - SACRUM 1 WITH INSTRUMENTATION AND ALLOGRAFT;  Surgeon: Phylliss Bob, MD;  Location: [facility];  Service: Orthopedics;  Laterality: N/A;  . BREAST BIOPSY  54 years old   cyst removal  . CHOLECYSTECTOMY    . HARDWARE REMOVAL N/A 02/25/2019   Procedure: HARDWARE REMOVAL;  Surgeon: Phylliss Bob, MD;  Location: [facility];  Service: Orthopedics;  Laterality: N/A;  . LUMBAR LAMINECTOMY/DECOMPRESSION MICRODISCECTOMY N/A 02/25/2019   Procedure: LUMBAR 5 - SACRUM 1 DECOMPRESSION WITH REMOVAL OF HARDWARE;  Surgeon: Phylliss Bob, MD;  Location: [facility];  Service: Orthopedics;  Laterality: N/A;    Prior to Admission medications   Medication Sig Start Date End Date Taking? Authorizing Provider  furosemide (LASIX) 20 MG tablet Take 20 mg by mouth daily as needed for fluid.   Yes [provider]  cholecalciferol (VITAMIN D3) 25 MCG (1000 UNIT) tablet Take 1,000 Units by mouth daily.    [provider]  Cod Liver Oil 1000 MG CAPS Take 1,000 mg by mouth daily.    [provider]  cyclobenzaprine (FLEXERIL) 10 MG tablet Take  10 mg by mouth 2 (two) times daily as needed for muscle spasms.    [provider]  Multiple Vitamin (MULTIVITAMIN WITH MINERALS) TABS tablet Take 1 tablet by mouth daily.    [provider]  ondansetron (ZOFRAN) 8 MG tablet Take 1 tablet (8 mg total) by mouth every 8 (eight) hours as needed for nausea or vomiting. 10/05/19   Molpus, Jenny Reichmann, MD  pregabalin (LYRICA) 50 MG capsule Take 50 mg by mouth 2 (two) times daily.    [provider]  vitamin B-12 (CYANOCOBALAMIN) 1000 MCG tablet Take 1,000 mcg by mouth daily.    [provider]    Scheduled Meds: Continuous Infusions: . sodium chloride    . lactated ringers 10 mL/hr at 01/01/20 0757   PRN Meds:.  Allergies as of 11/18/2019 - Review Complete 10/11/2019  Allergen Reaction Noted  . Shellfish allergy Anaphylaxis 06/14/2012    Family History  Problem Relation Age of Onset  . Colon cancer Maternal Grandmother   . Brain cancer Maternal Grandmother     Social History   Socioeconomic History  . Marital status: Single    Spouse name: Not on file  . Number of children: Not on file  . Years of education: Not on file  . Highest education level: Not on file  Occupational History  . Occupation: Database administrator: [employer]    Comment: [employer]  Tobacco Use  . Smoking status: Never Smoker  . Smokeless tobacco: Never Used  Vaping Use  . Vaping  Use: Never used  Substance and Sexual Activity  . Alcohol use: No  . Drug use: No  . Sexual activity: Never    Birth control/protection: None  Other Topics Concern  . Not on file  Social History Narrative  . Not on file   Social Determinants of Health   Financial Resource Strain: Not on file  Food Insecurity: Not on file  Transportation Needs: Not on file  Physical Activity: Not on file  Stress: Not on file  Social Connections: Not on file  Intimate Partner Violence: Not on file    Review of Systems: All negative except as stated above in  HPI.  Physical Exam: Vital signs: Vitals:   01/01/20 0745  BP: (!) 145/66  Pulse: 78  Resp: 12  Temp: 98.2 F (36.8 C)  SpO2: 97%     General:   Alert,  Well-developed, well-nourished, pleasant and cooperative in NAD Lungs:  Clear throughout to auscultation.   No wheezes, crackles, or rhonchi. No acute distress. Heart:  Regular rate and rhythm; no murmurs, clicks, rubs,  or gallops. Abdomen: Soft, nontender, nondistended, bowel sounds present. No peritoneal signs Rectal:  Deferred  GI:  Lab Results: No results for input(s): WBC, HGB, HCT, PLT in the last 72 hours. BMET No results for input(s): NA, K, CL, CO2, GLUCOSE, BUN, CREATININE, CALCIUM in the last 72 hours. LFT No results for input(s): PROT, ALBUMIN, AST, ALT, ALKPHOS, BILITOT, BILIDIR, IBILI in the last 72 hours. PT/INR No results for input(s): LABPROT, INR in the last 72 hours.   Studies/Results: No results found.  Impression/Plan: Chronic GERD Colon cancer screening Morbid obesity  Recommendations ------------------------- -Proceed with EGD and colonoscopy today.  Risks (bleeding, infection, bowel perforation that could require surgery, sedation-related changes in cardiopulmonary systems), benefits (identification and possible treatment of source of symptoms, exclusion of certain causes of symptoms), and alternatives (watchful waiting, radiographic imaging studies, empiric medical treatment)  were explained to patient/family in detail and patient wishes to proceed.    LOS: 0 days   Otis Brace  MD, FACP 01/01/2020, 8:28 AM  Contact #  [phone]

## 2020-01-01 NOTE — Anesthesia Postprocedure Evaluation (Signed)
Anesthesia Post Note  Patient: Tracy Meyer  Procedure(s) Performed: COLONOSCOPY WITH PROPOFOL (N/A ) ESOPHAGOGASTRODUODENOSCOPY (EGD) WITH PROPOFOL (N/A ) BIOPSY POLYPECTOMY     Patient location during evaluation: Endoscopy Anesthesia Type: MAC Level of consciousness: awake and alert Pain management: pain level controlled Vital Signs Assessment: post-procedure vital signs reviewed and stable Respiratory status: spontaneous breathing, nonlabored ventilation, respiratory function stable and patient connected to nasal cannula oxygen Cardiovascular status: blood pressure returned to baseline and stable Postop Assessment: no apparent nausea or vomiting Anesthetic complications: no   No complications documented.  Last Vitals:  Vitals:   01/01/20 0924 01/01/20 0925  BP: (!) 132/109   Pulse: 100   Resp:  16  Temp: 36.7 C   SpO2:      Last Pain:  Vitals:   01/01/20 0940  TempSrc:   PainSc: 0-No pain                 Caylor Tallarico L Lucile Didonato

## 2020-01-01 NOTE — Op Note (Addendum)
[facility] Patient Name: Tracy Meyer Procedure Date: 01/01/2020 MRN: [id number] Attending MD: Otis Brace , MD Date of Birth: 20-Aug-1965 CSN: [id number] Age: 54 Admit Type: Outpatient Procedure:                Upper GI endoscopy Indications:              Heartburn Providers:                Otis Brace, MD, Benay Pillow, RN, Ladona Ridgel, Technician, Margurite Auerbach Referring MD:              Medicines:                Sedation Administered by an Anesthesia Professional Complications:            No immediate complications. Estimated Blood Loss:     Estimated blood loss was minimal. Procedure:                Pre-Anesthesia Assessment:                           - Prior to the procedure, a History and Physical                            was performed, and patient medications and                            allergies were reviewed. The patient's tolerance of                            previous anesthesia was also reviewed. The risks                            and benefits of the procedure and the sedation                            options and risks were discussed with the patient.                            All questions were answered, and informed consent                            was obtained. Prior Anticoagulants: The patient has                            taken no previous anticoagulant or antiplatelet                            agents. ASA Grade Assessment: III - A patient with                            severe systemic disease. After reviewing the risks  and benefits, the patient was deemed in                            satisfactory condition to undergo the procedure.                           After obtaining informed consent, the endoscope was                            passed under direct vision. Throughout the                            procedure, the patient's blood pressure, pulse, and                             oxygen saturations were monitored continuously. The                            [id number]-H190 ([id number]) Olympus gastroscope was                            introduced through the mouth, and advanced to the                            second part of duodenum. The upper GI endoscopy was                            accomplished without difficulty. The patient                            tolerated the procedure well. Scope In: Scope Out: Findings:      A non-obstructing Schatzki ring was found at the gastroesophageal       junction.      A small hiatal hernia was present.      Scattered mild inflammation characterized by congestion (edema) and       erythema was found in the prepyloric region of the stomach. Biopsies       were taken with a cold forceps for histology.      The cardia and gastric fundus were normal on retroflexion.      The duodenal bulb, first portion of the duodenum and second portion of       the duodenum were normal. Impression:               - Non-obstructing Schatzki ring.                           - Small hiatal hernia.                           - Gastritis. Biopsied.                           - Normal duodenal bulb, first portion of the  duodenum and second portion of the duodenum. Moderate Sedation:      Moderate (conscious) sedation was personally administered by an       anesthesia professional. The following parameters were monitored: oxygen       saturation, heart rate, blood pressure, and response to care. Recommendation:           - Patient has a contact number available for                            emergencies. The signs and symptoms of potential                            delayed complications were discussed with the                            patient. Return to normal activities tomorrow.                            Written discharge instructions were provided to the                            patient.                            - Resume previous diet.                           - Continue present medications.                           - Await pathology results.                           - Perform a colonoscopy today. Procedure Code(s):        --- Professional ---                           [phone], Esophagogastroduodenoscopy, flexible,                            transoral; with biopsy, single or multiple Diagnosis Code(s):        --- Professional ---                           K22.2, Esophageal obstruction                           K44.9, Diaphragmatic hernia without obstruction or                            gangrene                           K29.70, Gastritis, unspecified, without bleeding                           R12, Heartburn CPT copyright 2019 [employer]. All rights reserved. The codes documented  in this report are preliminary and upon coder review may  be revised to meet current compliance requirements. Otis Brace, MD Otis Brace, MD 01/01/2020 9:28:59 AM Number of Addenda: 0

## 2020-01-01 NOTE — Transfer of Care (Signed)
Immediate Anesthesia Transfer of Care Note  Patient: Tracy Meyer  Procedure(s) Performed: COLONOSCOPY WITH PROPOFOL (N/A ) ESOPHAGOGASTRODUODENOSCOPY (EGD) WITH PROPOFOL (N/A ) BIOPSY POLYPECTOMY  Patient Location: PACU and Endoscopy Unit  Anesthesia Type:MAC  Level of Consciousness: awake, alert  and oriented  Airway & Oxygen Therapy: Patient Spontanous Breathing and Patient connected to face mask  Post-op Assessment: Report given to RN and Post -op Vital signs reviewed and stable  Post vital signs: Reviewed and stable  Last Vitals:  Vitals Value Taken Time  BP    Temp    Pulse 115 01/01/20 0925  Resp 28 01/01/20 0925  SpO2 100 % 01/01/20 0925  Vitals shown include unvalidated device data.  Last Pain:  Vitals:   01/01/20 0745  TempSrc: Oral  PainSc: 0-No pain         Complications: No complications documented.

## 2020-01-01 NOTE — Anesthesia Preprocedure Evaluation (Addendum)
Anesthesia Evaluation  Patient identified by MRN, date of birth, ID band Patient awake    Reviewed: Allergy & Precautions, NPO status , Patient's Chart, lab work & pertinent test results  History of Anesthesia Complications (+) PONV  Airway Mallampati: IV  TM Distance: >3 FB Neck ROM: Full    Dental no notable dental hx. (+) Teeth Intact, Dental Advisory Given   Pulmonary neg pulmonary ROS,    Pulmonary exam normal breath sounds clear to auscultation       Cardiovascular negative cardio ROS Normal cardiovascular exam Rhythm:Regular Rate:Normal     Neuro/Psych negative neurological ROS  negative psych ROS   GI/Hepatic Neg liver ROS, GERD  Medicated,  Endo/Other  Morbid obesity (BMI 48)  Renal/GU negative Renal ROS  negative genitourinary   Musculoskeletal negative musculoskeletal ROS (+)   Abdominal   Peds  Hematology negative hematology ROS (+)   Anesthesia Other Findings Colonoscopy/EGD for screening/GERD  Reproductive/Obstetrics                            Anesthesia Physical Anesthesia Plan  ASA: III  Anesthesia Plan: MAC   Post-op Pain Management:    Induction: Intravenous  PONV Risk Score and Plan: Propofol infusion and Treatment may vary due to age or medical condition  Airway Management Planned: Natural Airway  Additional Equipment:   Intra-op Plan:   Post-operative Plan:   Informed Consent: I have reviewed the patients History and Physical, chart, labs and discussed the procedure including the risks, benefits and alternatives for the proposed anesthesia with the patient or authorized representative who has indicated his/her understanding and acceptance.     Dental advisory given  Plan Discussed with: CRNA  Anesthesia Plan Comments:         Anesthesia Quick Evaluation

## 2020-01-01 NOTE — Op Note (Addendum)
[facility] Patient Name: Tracy Meyer Procedure Date: 01/01/2020 MRN: [id number] Attending MD: Otis Brace , MD Date of Birth: [birth year] CSN: [id number] Age: 54 Admit Type: Outpatient Procedure:                Colonoscopy Indications:              Screening for colorectal malignant neoplasm, This                            is the patient's first colonoscopy Providers:                Otis Brace, MD, Benay Pillow, RN, Ladona Ridgel, Technician, Margurite Auerbach Referring MD:              Medicines:                Sedation Administered by an Anesthesia Professional Complications:            No immediate complications. Estimated Blood Loss:     Estimated blood loss was minimal. Procedure:                Pre-Anesthesia Assessment:                           - Prior to the procedure, a History and Physical                            was performed, and patient medications and                            allergies were reviewed. The patient's tolerance of                            previous anesthesia was also reviewed. The risks                            and benefits of the procedure and the sedation                            options and risks were discussed with the patient.                            All questions were answered, and informed consent                            was obtained. Prior Anticoagulants: The patient has                            taken no previous anticoagulant or antiplatelet                            agents. ASA Grade Assessment: III - A patient with  severe systemic disease. After reviewing the risks                            and benefits, the patient was deemed in                            satisfactory condition to undergo the procedure.                           - Prior to the procedure, a History and Physical                            was performed, and patient medications  and                            allergies were reviewed. The patient's tolerance of                            previous anesthesia was also reviewed. The risks                            and benefits of the procedure and the sedation                            options and risks were discussed with the patient.                            All questions were answered, and informed consent                            was obtained. Prior Anticoagulants: The patient has                            taken no previous anticoagulant or antiplatelet                            agents. ASA Grade Assessment: III - A patient with                            severe systemic disease. After reviewing the risks                            and benefits, the patient was deemed in                            satisfactory condition to undergo the procedure.                           After obtaining informed consent, the colonoscope                            was passed under direct vision. Throughout the  procedure, the patient's blood pressure, pulse, and                            oxygen saturations were monitored continuously. The                            [id number]-H190DL ([id number] Olympus pediatric colonscope                            was introduced through the anus and advanced to the                            the cecum, identified by appendiceal orifice and                            ileocecal valve. The colonoscopy was performed                            without difficulty. The patient tolerated the                            procedure well. The quality of the bowel                            preparation was adequate to identify polyps 6 mm                            and larger in size. Scope In: 8:51:40 AM Scope Out: 9:14:12 AM Scope Withdrawal Time: 0 hours 14 minutes 10 seconds  Total Procedure Duration: 0 hours 22 minutes 32 seconds  Findings:      The perianal and digital rectal  examinations were normal.      A 3 mm polyp was found in the cecum. The polyp was sessile. The polyp       was removed with a cold snare. Resection and retrieval were complete.      A 4 mm polyp was found in the transverse colon. The polyp was sessile.       The polyp was removed with a cold snare. Resection and retrieval were       complete.      Many small-mouthed diverticula were found in the entire colon.      Internal hemorrhoids were found during retroflexion. The hemorrhoids       were medium-sized. Impression:               - One 3 mm polyp in the cecum, removed with a cold                            snare. Resected and retrieved.                           - One 4 mm polyp in the transverse colon, removed                            with a cold snare. Resected and retrieved.                           -  Diverticulosis in the entire examined colon.                           - Internal hemorrhoids. Moderate Sedation:      Moderate (conscious) sedation was personally administered by an       anesthesia professional. The following parameters were monitored: oxygen       saturation, heart rate, blood pressure, and response to care. Recommendation:           - Patient has a contact number available for                            emergencies. The signs and symptoms of potential                            delayed complications were discussed with the                            patient. Return to normal activities tomorrow.                            Written discharge instructions were provided to the                            patient.                           - Resume previous diet.                           - Continue present medications.                           - Await pathology results.                           - Repeat colonoscopy in 5-10 years for surveillance.                           - Return to my office PRN. Procedure Code(s):        --- Professional ---                            [phone], Colonoscopy, flexible; with removal of                            tumor(s), polyp(s), or other lesion(s) by snare                            technique Diagnosis Code(s):        --- Professional ---                           Z12.11, Encounter for screening for malignant                            neoplasm of colon  K63.5, Polyp of colon                           K64.8, Other hemorrhoids                           K57.30, Diverticulosis of large intestine without                            perforation or abscess without bleeding CPT copyright 2019 [employer]. All rights reserved. The codes documented in this report are preliminary and upon coder review may  be revised to meet current compliance requirements. Otis Brace, MD Otis Brace, MD 01/01/2020 9:35:41 AM Number of Addenda: 0

## 2020-01-01 NOTE — Discharge Instructions (Signed)

## 2020-01-04 ENCOUNTER — Other Ambulatory Visit: Payer: Self-pay

## 2020-01-05 LAB — SURGICAL PATHOLOGY

## 2020-04-15 DIAGNOSIS — M7989 Other specified soft tissue disorders: Secondary | ICD-10-CM | POA: Diagnosis not present

## 2020-04-15 DIAGNOSIS — M519 Unspecified thoracic, thoracolumbar and lumbosacral intervertebral disc disorder: Secondary | ICD-10-CM | POA: Diagnosis not present

## 2020-04-15 DIAGNOSIS — M549 Dorsalgia, unspecified: Secondary | ICD-10-CM | POA: Diagnosis not present

## 2020-05-04 DIAGNOSIS — R7303 Prediabetes: Secondary | ICD-10-CM | POA: Diagnosis not present

## 2020-05-04 DIAGNOSIS — E559 Vitamin D deficiency, unspecified: Secondary | ICD-10-CM | POA: Diagnosis not present

## 2020-05-04 DIAGNOSIS — I10 Essential (primary) hypertension: Secondary | ICD-10-CM | POA: Diagnosis not present

## 2020-05-04 DIAGNOSIS — Z0000 Encounter for general adult medical examination without abnormal findings: Secondary | ICD-10-CM | POA: Diagnosis not present

## 2020-05-04 DIAGNOSIS — Z13220 Encounter for screening for lipoid disorders: Secondary | ICD-10-CM | POA: Diagnosis not present

## 2020-06-09 DIAGNOSIS — Z01419 Encounter for gynecological examination (general) (routine) without abnormal findings: Secondary | ICD-10-CM | POA: Diagnosis not present

## 2020-06-14 ENCOUNTER — Other Ambulatory Visit: Payer: Self-pay | Admitting: Nurse Practitioner

## 2020-06-14 DIAGNOSIS — Z9189 Other specified personal risk factors, not elsewhere classified: Secondary | ICD-10-CM

## 2020-06-30 DIAGNOSIS — I10 Essential (primary) hypertension: Secondary | ICD-10-CM | POA: Diagnosis not present

## 2020-06-30 DIAGNOSIS — R7303 Prediabetes: Secondary | ICD-10-CM | POA: Diagnosis not present

## 2020-07-21 ENCOUNTER — Other Ambulatory Visit: Payer: Self-pay | Admitting: Internal Medicine

## 2020-07-21 DIAGNOSIS — Z1231 Encounter for screening mammogram for malignant neoplasm of breast: Secondary | ICD-10-CM

## 2020-07-26 ENCOUNTER — Other Ambulatory Visit: Payer: Self-pay

## 2020-07-26 ENCOUNTER — Ambulatory Visit
Admission: RE | Admit: 2020-07-26 | Discharge: 2020-07-26 | Disposition: A | Discharge status: Home / Self Care | Payer: BC Managed Care – PPO | Source: Ambulatory Visit | Attending: Nurse Practitioner | Admitting: Nurse Practitioner

## 2020-07-26 DIAGNOSIS — Z9189 Other specified personal risk factors, not elsewhere classified: Secondary | ICD-10-CM

## 2020-07-26 DIAGNOSIS — Z780 Asymptomatic menopausal state: Secondary | ICD-10-CM | POA: Diagnosis not present

## 2020-09-15 ENCOUNTER — Other Ambulatory Visit: Payer: Self-pay

## 2020-09-15 ENCOUNTER — Ambulatory Visit
Admission: RE | Admit: 2020-09-15 | Discharge: 2020-09-15 | Disposition: A | Discharge status: Home / Self Care | Payer: BC Managed Care – PPO | Source: Ambulatory Visit | Attending: Internal Medicine | Admitting: Internal Medicine

## 2020-09-15 DIAGNOSIS — Z1231 Encounter for screening mammogram for malignant neoplasm of breast: Secondary | ICD-10-CM

## 2020-10-08 DIAGNOSIS — Z0289 Encounter for other administrative examinations: Secondary | ICD-10-CM

## 2020-10-18 ENCOUNTER — Ambulatory Visit (INDEPENDENT_AMBULATORY_CARE_PROVIDER_SITE_OTHER): Payer: BC Managed Care – PPO | Admitting: Bariatrics

## 2020-10-18 ENCOUNTER — Encounter (INDEPENDENT_AMBULATORY_CARE_PROVIDER_SITE_OTHER): Payer: Self-pay | Admitting: Bariatrics

## 2020-10-18 ENCOUNTER — Other Ambulatory Visit: Payer: Self-pay

## 2020-10-18 VITALS — BP 132/84 | HR 66 | Temp 98.2°F | Ht 66.0 in | Wt 282.0 lb

## 2020-10-18 DIAGNOSIS — R5383 Other fatigue: Secondary | ICD-10-CM

## 2020-10-18 DIAGNOSIS — I10 Essential (primary) hypertension: Secondary | ICD-10-CM | POA: Diagnosis not present

## 2020-10-18 DIAGNOSIS — Z9189 Other specified personal risk factors, not elsewhere classified: Secondary | ICD-10-CM

## 2020-10-18 DIAGNOSIS — R0602 Shortness of breath: Secondary | ICD-10-CM

## 2020-10-18 DIAGNOSIS — E559 Vitamin D deficiency, unspecified: Secondary | ICD-10-CM | POA: Diagnosis not present

## 2020-10-18 DIAGNOSIS — E538 Deficiency of other specified B group vitamins: Secondary | ICD-10-CM

## 2020-10-18 DIAGNOSIS — Z1331 Encounter for screening for depression: Secondary | ICD-10-CM | POA: Diagnosis not present

## 2020-10-18 DIAGNOSIS — E66813 Obesity, class 3: Secondary | ICD-10-CM

## 2020-10-18 DIAGNOSIS — Z6841 Body Mass Index (BMI) 40.0 and over, adult: Secondary | ICD-10-CM

## 2020-10-18 DIAGNOSIS — R7303 Prediabetes: Secondary | ICD-10-CM | POA: Diagnosis not present

## 2020-10-18 NOTE — Progress Notes (Signed)
Dear Dr. Lysle Rubens,   Thank you for referring Tracy Meyer to our clinic. The following note includes my evaluation and treatment recommendations.  Chief Complaint:   OBESITY Tracy Meyer (MR# [id number]) is a 55 y.o. female who presents for evaluation and treatment of obesity and related comorbidities. Current BMI is Body mass index is 45.52 kg/m. Tracy Meyer has been struggling with her weight for many years and has been unsuccessful in either losing weight, maintaining weight loss, or reaching her healthy weight goal.  Tracy Meyer is currently in the action stage of change and ready to dedicate time achieving and maintaining a healthier weight. Tracy Meyer is interested in becoming our patient and working on intensive lifestyle modifications including (but not limited to) diet and exercise for weight loss.  Tracy Meyer likes to [employer].  She does not consider herself to be a picky eater.  She states that she is trying to be vegetarian.  Tracy Meyer's habits were reviewed today and are as follows: Her family eats meals together, she thinks her family will eat healthier with her, she struggles with family and or coworkers weight loss sabotage, her desired weight loss is 159 pounds, she started gaining excessive weight after having back surgery, her heaviest weight ever was 304 pounds, she is trying to follow a vegetarian diet, she is frequently drinking liquids with calories, she frequently eats larger portions than normal, and she struggles with emotional eating.  Depression Screen Leann's Food and Mood (modified PHQ-9) score was 21.  Depression screen [facility] 2/9 10/18/2020  Decreased Interest 3  Down, Depressed, Hopeless 3  PHQ - 2 Score 6  Altered sleeping 3  Tired, decreased energy 3  Change in appetite 0  Feeling bad or failure about yourself  3  Trouble concentrating 0  Moving slowly or fidgety/restless 3  Suicidal thoughts 3  PHQ-9 Score 21  Difficult doing work/chores Not difficult at all    Subjective:   1. Other fatigue Tracy Meyer admits to daytime somnolence and reports waking up still tired. Patent has a history of symptoms of daytime fatigue, morning fatigue, morning headache, and snoring. Tracy Meyer generally gets 4 or 5 hours of sleep per night, and states that she has poor sleep quality. Snoring is present. Apneic episodes are not present. Epworth Sleepiness Score is 14.  Occurs with certain activities.  No red flags.  2. SOB (shortness of breath) on exertion Tracy Meyer notes increasing shortness of breath with exercising and seems to be worsening over time with weight gain. She notes getting out of breath sooner with activity than she used to. This has gotten worse recently. Tracy Meyer denies shortness of breath at rest or orthopnea.  Occurs with certain activities.  No red flags.  3. Prediabetes Tracy Meyer has a diagnosis of prediabetes based on her elevated HgA1c and was informed this puts her at greater risk of developing diabetes. She continues to work on diet and exercise to decrease her risk of diabetes. She denies nausea or hypoglycemia.  She is on no medications for this.    4. Essential hypertension Reasonably well controlled.  Review: taking medications as instructed, no medication side effects noted, no chest pain on exertion, no dyspnea on exertion, no swelling of ankles. Taking HCTZ.    BP Readings from Last 3 Encounters:  10/18/20 132/84  01/01/20 (!) 138/96  10/11/19 (!) 150/97   5. Vitamin D deficiency Tracy Meyer is taking a daily multivitamin with vitamin D.  6. B12 deficiency She is taking OTC vitamin B12.  7. Depression  screen [name] was screened for depression as part of her new patient workup today.  PHQ-9 is 21.  8. At risk for activity intolerance [name] is at risk for activity intolerance due to back pain, swelling in feet/legs, and obesity.  Assessment/Plan:   1. Other fatigue Tracy Meyer does feel that her weight is causing her energy to be lower than it  should be. Fatigue may be related to obesity, depression or many other causes. Labs will be ordered, and in the meanwhile, Tracy Meyer will focus on self care including making healthy food choices, increasing physical activity and focusing on stress reduction.  Gradually increase activities/exercise as weight decreases.   - EKG 12-Lead - T3 - T4, free - TSH  2. SOB (shortness of breath) on exertion Tracy Meyer does feel that she gets out of breath more easily that she used to when she exercises. Tracy Meyer's shortness of breath appears to be obesity related and exercise induced. She has agreed to work on weight loss and gradually increase exercise to treat her exercise induced shortness of breath. Will continue to monitor closely.  Gradually increase activities/exercise as weight decreases.  Will check thyroid panel today.  - T3 - T4, free - TSH  3. Prediabetes Tracy Meyer will continue to work on weight loss, exercise, and decreasing simple carbohydrates to help decrease the risk of diabetes.  Decrease carbohydrates and increase healthy fats and protein.  - Comprehensive metabolic panel - Hemoglobin A1c - Insulin, random  4. Essential hypertension Tracy Meyer is working on healthy weight loss and exercise to improve blood pressure control. We will watch for signs of hypotension as she continues her lifestyle modifications.  Continue medication.  - Comprehensive metabolic panel - Lipid Panel With LDL/HDL Ratio  5. Vitamin D deficiency Will check vitamin D level today, as per below.  - VITAMIN D 25 Hydroxy (Vit-D Deficiency, Fractures)  6. B12 deficiency Will check vitamin B12 level today.  Counseling The body needs vitamin B12: to make red blood cells; to make DNA; and to help the nerves work properly so they can carry messages from the brain to the body.  The main causes of vitamin B12 deficiency include dietary deficiency, digestive diseases, pernicious anemia, and having a surgery in which part of  the stomach or small intestine is removed.  Certain medicines can make it harder for the body to absorb vitamin B12. These medicines include: heartburn medications; some antibiotics; some medications used to treat diabetes, gout, and high cholesterol.  In some cases, there are no symptoms of this condition. If the condition leads to anemia or nerve damage, various symptoms can occur, such as weakness or fatigue, shortness of breath, and numbness or tingling in your hands and feet.   Treatment:  May include taking vitamin B12 supplements.  Avoid alcohol.  Eat lots of healthy foods that contain vitamin B12: Beef, pork, chicken, [place], and organ meats, such as liver.  Seafood: This includes clams, rainbow trout, salmon, tuna, and haddock. Eggs.  Cereal and dairy products that are fortified: This means that vitamin B12 has been added to the food.   - Vitamin B12  7. Depression screen Rennie had a positive depression screening. Depression is commonly associated with obesity and often results in emotional eating behaviors. We will monitor this closely and work on CBT to help improve the non-hunger eating patterns. Referral to Psychology may be required if no improvement is seen as she continues in our clinic.  8. At risk for activity intolerance Martesha was given approximately  15 minutes of exercise intolerance counseling today. She is 55 y.o. female and has risk factors exercise intolerance including obesity. We discussed intensive lifestyle modifications today with an emphasis on specific weight loss instructions and strategies. Kahlia will slowly increase activity as tolerated.  Repetitive spaced learning was employed today to elicit superior memory formation and behavioral change.   9. Class 3 severe obesity with serious comorbidity and body mass index (BMI) of 45.0 to 49.9 in adult, unspecified obesity type (HCC)  Teisha is currently in the action stage of change and her goal is to continue  with weight loss efforts. I recommend Harvest begin the structured treatment plan as follows:  She has agreed to the Category 2 Plan and keeping a food journal and adhering to recommended goals of 1200 calories and 80 grams of protein.  She will work on meal planning and stopping all sugary drinks.  Protein Equivalent sheet was provided.  Exercise goals: No exercise has been prescribed at this time.   Behavioral modification strategies: increasing lean protein intake, decreasing simple carbohydrates, increasing vegetables, increasing water intake, decreasing eating out, no skipping meals, meal planning and cooking strategies, keeping healthy foods in the home, and planning for success.  She was informed of the importance of frequent follow-up visits to maximize her success with intensive lifestyle modifications for her multiple health conditions. She was informed we would discuss her lab results at her next visit unless there is a critical issue that needs to be addressed sooner. Dionisia agreed to keep her next visit at the agreed upon time to discuss these results.  Objective:   Blood pressure 132/84, pulse 66, temperature 98.2 F (36.8 C), height 5\' 6"  (1.676 m), weight 282 lb (127.9 kg), last menstrual period 04/15/2014, SpO2 97 %. Body mass index is 45.52 kg/m.  EKG: Normal sinus rhythm, rate 63 bpm.  Indirect Calorimeter completed today shows a VO2 of 222 and a REE of 1526.  Her calculated basal metabolic rate is 1219 thus her basal metabolic rate is worse than expected.  General: Cooperative, alert, well developed, in no acute distress. HEENT: Conjunctivae and lids unremarkable. Cardiovascular: Regular rhythm.  Lungs: Normal work of breathing. Neurologic: No focal deficits.   Lab Results  Component Value Date   CREATININE 1.04 (H) 10/04/2019   BUN 7 10/04/2019   NA 137 10/04/2019   K 3.7 10/04/2019   CL 100 10/04/2019   CO2 27 10/04/2019   Lab Results  Component Value Date    ALT 19 02/19/2019   AST 20 02/19/2019   ALKPHOS 83 02/19/2019   BILITOT 1.3 (H) 02/19/2019   Lab Results  Component Value Date   TSH 2.11 04/25/2015   Lab Results  Component Value Date   WBC 6.7 10/04/2019   HGB 13.2 10/04/2019   HCT 40.7 10/04/2019   MCV 94.7 10/04/2019   PLT 202 10/04/2019   Attestation Statements:   Reviewed by clinician on day of visit: allergies, medications, problem list, medical history, surgical history, family history, social history, and previous encounter notes.  I, Water quality scientist, CMA, am acting as Location manager for [employer], DO  I have reviewed the above documentation for accuracy and completeness, and I agree with the above. Jearld Lesch, DO

## 2020-10-19 ENCOUNTER — Encounter (INDEPENDENT_AMBULATORY_CARE_PROVIDER_SITE_OTHER): Payer: Self-pay | Admitting: Bariatrics

## 2020-10-19 DIAGNOSIS — R7303 Prediabetes: Secondary | ICD-10-CM | POA: Insufficient documentation

## 2020-10-19 DIAGNOSIS — Z6841 Body Mass Index (BMI) 40.0 and over, adult: Secondary | ICD-10-CM | POA: Insufficient documentation

## 2020-10-19 DIAGNOSIS — E559 Vitamin D deficiency, unspecified: Secondary | ICD-10-CM | POA: Insufficient documentation

## 2020-10-19 LAB — TSH: TSH: 1.99 u[IU]/mL (ref 0.450–4.500)

## 2020-10-19 LAB — COMPREHENSIVE METABOLIC PANEL
ALT: 14 IU/L (ref 0–32)
AST: 22 IU/L (ref 0–40)
Albumin/Globulin Ratio: 1.9 (ref 1.2–2.2)
Albumin: 4 g/dL (ref 3.8–4.9)
Alkaline Phosphatase: 76 IU/L (ref 44–121)
BUN/Creatinine Ratio: 9 (ref 9–23)
BUN: 8 mg/dL (ref 6–24)
Bilirubin Total: 0.9 mg/dL (ref 0.0–1.2)
CO2: 26 mmol/L (ref 20–29)
Calcium: 8.8 mg/dL (ref 8.7–10.2)
Chloride: 102 mmol/L (ref 96–106)
Creatinine, Ser: 0.91 mg/dL (ref 0.57–1.00)
Globulin, Total: 2.1 g/dL (ref 1.5–4.5)
Glucose: 106 mg/dL — ABNORMAL HIGH (ref 70–99)
Potassium: 4 mmol/L (ref 3.5–5.2)
Sodium: 141 mmol/L (ref 134–144)
Total Protein: 6.1 g/dL (ref 6.0–8.5)
eGFR: 75 mL/min/{1.73_m2} (ref >59)

## 2020-10-19 LAB — LIPID PANEL WITH LDL/HDL RATIO
Cholesterol, Total: 187 mg/dL (ref 100–199)
HDL: 39 mg/dL — ABNORMAL LOW (ref >39)
LDL Chol Calc (NIH): 124 mg/dL — ABNORMAL HIGH (ref 0–99)
LDL/HDL Ratio: 3.2 ratio (ref 0.0–3.2)
Triglycerides: 133 mg/dL (ref 0–149)
VLDL Cholesterol Cal: 24 mg/dL (ref 5–40)

## 2020-10-19 LAB — VITAMIN B12: Vitamin B-12: 263 pg/mL (ref 232–1245)

## 2020-10-19 LAB — T3: T3, Total: 137 ng/dL (ref 71–180)

## 2020-10-19 LAB — VITAMIN D 25 HYDROXY (VIT D DEFICIENCY, FRACTURES): Vit D, 25-Hydroxy: 19.9 ng/mL — ABNORMAL LOW (ref 30.0–100.0)

## 2020-10-19 LAB — INSULIN, RANDOM: INSULIN: 9.6 u[IU]/mL (ref 2.6–24.9)

## 2020-10-19 LAB — HEMOGLOBIN A1C
Est. average glucose Bld gHb Est-mCnc: 123 mg/dL
Hgb A1c MFr Bld: 5.9 % — ABNORMAL HIGH (ref 4.8–5.6)

## 2020-10-19 LAB — T4, FREE: Free T4: 1.02 ng/dL (ref 0.82–1.77)

## 2020-11-01 ENCOUNTER — Other Ambulatory Visit: Payer: Self-pay

## 2020-11-01 ENCOUNTER — Ambulatory Visit (INDEPENDENT_AMBULATORY_CARE_PROVIDER_SITE_OTHER): Payer: BC Managed Care – PPO | Admitting: Bariatrics

## 2020-11-01 ENCOUNTER — Encounter (INDEPENDENT_AMBULATORY_CARE_PROVIDER_SITE_OTHER): Payer: Self-pay | Admitting: Bariatrics

## 2020-11-01 VITALS — BP 117/73 | HR 82 | Temp 98.6°F | Ht 66.0 in | Wt 290.0 lb

## 2020-11-01 DIAGNOSIS — Z6841 Body Mass Index (BMI) 40.0 and over, adult: Secondary | ICD-10-CM

## 2020-11-01 DIAGNOSIS — E538 Deficiency of other specified B group vitamins: Secondary | ICD-10-CM | POA: Diagnosis not present

## 2020-11-01 DIAGNOSIS — E559 Vitamin D deficiency, unspecified: Secondary | ICD-10-CM

## 2020-11-01 DIAGNOSIS — E66813 Obesity, class 3: Secondary | ICD-10-CM

## 2020-11-01 DIAGNOSIS — R7303 Prediabetes: Secondary | ICD-10-CM | POA: Diagnosis not present

## 2020-11-01 DIAGNOSIS — R600 Localized edema: Secondary | ICD-10-CM

## 2020-11-01 MED ORDER — FUROSEMIDE 20 MG PO TABS: 20.0000 mg | ORAL_TABLET | Freq: Every day | ORAL | 0 refills | Status: DC

## 2020-11-01 MED ORDER — VITAMIN D (ERGOCALCIFEROL) 1.25 MG (50000 UNIT) PO CAPS: 50000.0000 [IU] | ORAL_CAPSULE | ORAL | 0 refills | Status: DC

## 2020-11-02 NOTE — Progress Notes (Signed)
Chief Complaint:   OBESITY Tracy Meyer is here to discuss her progress with her obesity treatment plan along with follow-up of her obesity related diagnoses. Tracy Meyer is on the Category 2 Plan and keeping a food journal and adhering to recommended goals of 1200 calories and 80 grams of protein and states she is following her eating plan approximately 50% of the time. Tracy Meyer states she is walking 8 miles total.  Today's visit was #: 3 Starting weight: 282 lbs Starting date: 10/18/2020 Today's weight: 290 lbs Today's date: 11/01/2020 Total lbs lost to date: 0 Total lbs lost since last in-office visit: 0  Interim History: Tracy Meyer is up 8 lbs since her last visit (Tracy Meyer lower legs).  Subjective:   1. Vitamin D deficiency She is currently taking prescription vitamin D 50,000 IU each week. She denies nausea, vomiting or muscle weakness. Her last Vitamin D level was 19.9.  2. Prediabetes Tracy Meyer has a diagnosis of prediabetes based on her elevated HgA1c and was informed this puts her at greater risk of developing diabetes. She continues to work on diet and exercise to decrease her risk of diabetes. Her last A1C was 5.9. She denies nausea or hypoglycemia.  3. B12 deficiency Tracy Meyer's B 12 is currently 263 low-normal.  4. Edema, lower extremity Tracy Meyer is sitting more states Dr. Volney American  Assessment/Plan:   1. Vitamin D deficiency Low Vitamin D level contributes to fatigue and are associated with obesity, breast, and colon cancer. We will refill prescription Vitamin D 50,000 IU every week for 1 month with no refills and she will follow-up for routine testing of Vitamin D, at least 2-3 times per year to avoid over-replacement.  - Vitamin D, Ergocalciferol, (DRISDOL) 1.25 MG (50000 UNIT) CAPS capsule; Take 1 capsule (50,000 Units total) by mouth every 7 (seven) days.  Dispense: 4 capsule; Refill: 0  2. Prediabetes Tracy Meyer will continue to work on weight loss, exercise, and decreasing simple  carbohydrates to help decrease the risk of diabetes. She will increase healthy fats and protein. She will increase activities.   3. B12 deficiency The diagnosis was reviewed with the patient. Counseling provided today, see below. Tracy Meyer will begin B 12 sublingual OTC 1,000 mcg sublingual daily. We will continue to monitor. Orders and follow up as documented in patient record.  Counseling The body needs vitamin B12: to make red blood cells; to make DNA; and to help the nerves work properly so they can carry messages from the brain to the body.  The main causes of vitamin B12 deficiency include dietary deficiency, digestive diseases, pernicious anemia, and having a surgery in which part of the stomach or small intestine is removed.  Certain medicines can make it harder for the body to absorb vitamin B12. These medicines include: heartburn medications; some antibiotics; some medications used to treat diabetes, gout, and high cholesterol.  In some cases, there are no symptoms of this condition. If the condition leads to anemia or nerve damage, various symptoms can occur, such as weakness or fatigue, shortness of breath, and numbness or tingling in your hands and feet.   Treatment:  May include taking vitamin B12 supplements.  Avoid alcohol.  Eat lots of healthy foods that contain vitamin B12: Beef, pork, chicken, [place], and organ meats, such as liver.  Seafood: This includes clams, rainbow trout, salmon, tuna, and haddock. Eggs.  Cereal and dairy products that are fortified: This means that vitamin B12 has been added to the food.    4. Edema, lower  extremity Tracy Meyer will schedule an appointment with her primary care provider. We will refill Lasix 20 mg for 1 month with no refills. She will have no salt.  - furosemide (LASIX) 20 MG tablet; Take 1 tablet (20 mg total) by mouth daily.  Dispense: 30 tablet; Refill: 0  5. Obesity with current BMI of 46.9 Tracy Meyer is currently in the action stage of  change. As such, her goal is to continue with weight loss efforts. She has agreed to the Category 2 Plan and keeping a food journal and adhering to recommended goals of 1200 calories and 80 grams of protein.   Tracy Meyer will continue meal planning. We will review labs from 10/13/2020.  Exercise goals:  As is.  Behavioral modification strategies: increasing lean protein intake, decreasing simple carbohydrates, increasing vegetables, increasing water intake, decreasing eating out, no skipping meals, meal planning and cooking strategies, keeping healthy foods in the home, and planning for success.  Tracy Meyer has agreed to follow-up with our clinic in 2 weeks. She was informed of the importance of frequent follow-up visits to maximize her success with intensive lifestyle modifications for her multiple health conditions.   Objective:   Blood pressure 117/73, pulse 82, temperature 98.6 F (37 C), height 5\' 6"  (1.676 m), weight 290 lb (131.5 kg), last menstrual period 04/15/2014, SpO2 97 %. Body mass index is 46.81 kg/m. Tracy Meyer has no shortness of breath. Lungs clear to auscultation. Walking with dyspena.  General: Cooperative, alert, well developed, in no acute distress. HEENT: Conjunctivae and lids unremarkable. Cardiovascular: Regular rhythm.  Lungs: Normal work of breathing. Neurologic: No focal deficits.   Lab Results  Component Value Date   CREATININE 0.91 10/18/2020   BUN 8 10/18/2020   NA 141 10/18/2020   K 4.0 10/18/2020   CL 102 10/18/2020   CO2 26 10/18/2020   Lab Results  Component Value Date   ALT 14 10/18/2020   AST 22 10/18/2020   ALKPHOS 76 10/18/2020   BILITOT 0.9 10/18/2020   Lab Results  Component Value Date   HGBA1C 5.9 (H) 10/18/2020   Lab Results  Component Value Date   INSULIN 9.6 10/18/2020   Lab Results  Component Value Date   TSH 1.990 10/18/2020   Lab Results  Component Value Date   CHOL 187 10/18/2020   HDL 39 (L) 10/18/2020   LDLCALC 124 (H)  10/18/2020   TRIG 133 10/18/2020   Lab Results  Component Value Date   VD25OH 19.9 (L) 10/18/2020   Lab Results  Component Value Date   WBC 6.7 10/04/2019   HGB 13.2 10/04/2019   HCT 40.7 10/04/2019   MCV 94.7 10/04/2019   PLT 202 10/04/2019   No results found for: IRON, TIBC, FERRITIN  Attestation Statements:   Reviewed by clinician on day of visit: allergies, medications, problem list, medical history, surgical history, family history, social history, and previous encounter notes.   I, Lizbeth Bark, RMA, am acting as Location manager for [employer], DO.   I have reviewed the above documentation for accuracy and completeness, and I agree with the above. Jearld Lesch, DO

## 2020-11-03 ENCOUNTER — Encounter (INDEPENDENT_AMBULATORY_CARE_PROVIDER_SITE_OTHER): Payer: Self-pay | Admitting: Bariatrics

## 2020-11-16 ENCOUNTER — Other Ambulatory Visit: Payer: Self-pay

## 2020-11-16 ENCOUNTER — Encounter (INDEPENDENT_AMBULATORY_CARE_PROVIDER_SITE_OTHER): Payer: Self-pay | Admitting: Bariatrics

## 2020-11-16 ENCOUNTER — Ambulatory Visit (INDEPENDENT_AMBULATORY_CARE_PROVIDER_SITE_OTHER): Payer: BC Managed Care – PPO | Admitting: Bariatrics

## 2020-11-16 VITALS — BP 132/86 | HR 67 | Temp 98.0°F | Ht 66.0 in | Wt 287.0 lb

## 2020-11-16 DIAGNOSIS — R600 Localized edema: Secondary | ICD-10-CM | POA: Diagnosis not present

## 2020-11-16 DIAGNOSIS — E559 Vitamin D deficiency, unspecified: Secondary | ICD-10-CM

## 2020-11-16 DIAGNOSIS — Z6841 Body Mass Index (BMI) 40.0 and over, adult: Secondary | ICD-10-CM | POA: Diagnosis not present

## 2020-11-16 MED ORDER — FUROSEMIDE 20 MG PO TABS: 20.0000 mg | ORAL_TABLET | Freq: Every day | ORAL | 0 refills | Status: DC

## 2020-11-16 MED ORDER — VITAMIN D (ERGOCALCIFEROL) 1.25 MG (50000 UNIT) PO CAPS: 50000.0000 [IU] | ORAL_CAPSULE | ORAL | 0 refills | Status: DC

## 2020-11-17 NOTE — Progress Notes (Signed)
Chief Complaint:   OBESITY Tracy Meyer is here to discuss her progress with her obesity treatment plan along with follow-up of her obesity related diagnoses. Tracy Meyer is on the Category 2 Plan and states she is following her eating plan approximately 85% of the time. Tracy Meyer states she is not currently exercising.  Today's visit was #: 4 Starting weight: 282 lbs Starting date: 10/18/2020 Today's weight: 287 lbs Today's date: 11/16/2020 Total lbs lost to date: 0 Total lbs lost since last in-office visit: 3  Interim History: Tracy Meyer is down an additional 3 lbs. She is doing better with her protein.  Subjective:   1. Edema, lower extremity Tracy Meyer reports Lasix is helping with edema.  2. Vitamin D deficiency Pt is taking prescription Vit D.  Assessment/Plan:   1. Edema, lower extremity Continue Lasix 20 mg as directed. Wear support hose in the morning. No added salt.  Refill- furosemide (LASIX) 20 MG tablet; Take 1 tablet (20 mg total) by mouth daily.  Dispense: 30 tablet; Refill: 0  2. Vitamin D deficiency Low Vitamin D level contributes to fatigue and are associated with obesity, breast, and colon cancer. She agrees to continue to take prescription Vitamin D 50,000 IU every week and will follow-up for routine testing of Vitamin D, at least 2-3 times per year to avoid over-replacement.  Refill- Vitamin D, Ergocalciferol, (DRISDOL) 1.25 MG (50000 UNIT) CAPS capsule; Take 1 capsule (50,000 Units total) by mouth every 7 (seven) days.  Dispense: 4 capsule; Refill: 0  3. Obesity with current BMI of 46.5  Tracy Meyer is currently in the action stage of change. As such, her goal is to continue with weight loss efforts. She has agreed to the Category 2 Plan.   Meal planning and intentional eating.  Handout: Fruits  Exercise goals:  As is  Behavioral modification strategies: increasing lean protein intake, decreasing simple carbohydrates, increasing vegetables, increasing water intake,  decreasing eating out, no skipping meals, meal planning and cooking strategies, keeping healthy foods in the home, and planning for success.  Tracy Meyer has agreed to follow-up with our clinic in 2 weeks. She was informed of the importance of frequent follow-up visits to maximize her success with intensive lifestyle modifications for her multiple health conditions.   Objective:   Blood pressure 132/86, pulse 67, temperature 98 F (36.7 C), height 5\' 6"  (1.676 m), weight 287 lb (130.2 kg), last menstrual period 04/15/2014, SpO2 94 %. Body mass index is 46.32 kg/m.  General: Cooperative, alert, well developed, in no acute distress. HEENT: Conjunctivae and lids unremarkable. Cardiovascular: Regular rhythm.  Lungs: Normal work of breathing. Neurologic: No focal deficits.   Lab Results  Component Value Date   CREATININE 0.91 10/18/2020   BUN 8 10/18/2020   NA 141 10/18/2020   K 4.0 10/18/2020   CL 102 10/18/2020   CO2 26 10/18/2020   Lab Results  Component Value Date   ALT 14 10/18/2020   AST 22 10/18/2020   ALKPHOS 76 10/18/2020   BILITOT 0.9 10/18/2020   Lab Results  Component Value Date   HGBA1C 5.9 (H) 10/18/2020   Lab Results  Component Value Date   INSULIN 9.6 10/18/2020   Lab Results  Component Value Date   TSH 1.990 10/18/2020   Lab Results  Component Value Date   CHOL 187 10/18/2020   HDL 39 (L) 10/18/2020   LDLCALC 124 (H) 10/18/2020   TRIG 133 10/18/2020   Lab Results  Component Value Date   VD25OH 19.9 (L)  10/18/2020   Lab Results  Component Value Date   WBC 6.7 10/04/2019   HGB 13.2 10/04/2019   HCT 40.7 10/04/2019   MCV 94.7 10/04/2019   PLT 202 10/04/2019    Attestation Statements:   Reviewed by clinician on day of visit: allergies, medications, problem list, medical history, surgical history, family history, social history, and previous encounter notes.  Coral Ceo, CMA, am acting as Location manager for [employer], DO.  I have  reviewed the above documentation for accuracy and completeness, and I agree with the above. Jearld Lesch, DO

## 2020-11-18 ENCOUNTER — Encounter (INDEPENDENT_AMBULATORY_CARE_PROVIDER_SITE_OTHER): Payer: Self-pay | Admitting: Bariatrics

## 2020-11-24 ENCOUNTER — Other Ambulatory Visit (INDEPENDENT_AMBULATORY_CARE_PROVIDER_SITE_OTHER): Payer: Self-pay | Admitting: Bariatrics

## 2020-11-24 DIAGNOSIS — R600 Localized edema: Secondary | ICD-10-CM

## 2020-11-30 ENCOUNTER — Ambulatory Visit (INDEPENDENT_AMBULATORY_CARE_PROVIDER_SITE_OTHER): Payer: BC Managed Care – PPO | Admitting: Bariatrics

## 2020-12-01 DIAGNOSIS — I10 Essential (primary) hypertension: Secondary | ICD-10-CM | POA: Diagnosis not present

## 2020-12-01 DIAGNOSIS — Z23 Encounter for immunization: Secondary | ICD-10-CM | POA: Diagnosis not present

## 2020-12-01 DIAGNOSIS — M549 Dorsalgia, unspecified: Secondary | ICD-10-CM | POA: Diagnosis not present

## 2020-12-09 DIAGNOSIS — M545 Low back pain, unspecified: Secondary | ICD-10-CM | POA: Diagnosis not present

## 2020-12-12 ENCOUNTER — Other Ambulatory Visit (INDEPENDENT_AMBULATORY_CARE_PROVIDER_SITE_OTHER): Payer: Self-pay | Admitting: Bariatrics

## 2020-12-12 DIAGNOSIS — R600 Localized edema: Secondary | ICD-10-CM

## 2020-12-12 NOTE — Telephone Encounter (Signed)
LAST APPOINTMENT DATE: 11/16/2020 NEXT APPOINTMENT DATE: 12/21/2020   [id number] - [place], [place] - [address] [place] [address] Phone: [phone] Fax: [phone]  Patient is requesting a refill of the following medications: Requested Prescriptions   Pending Prescriptions Disp Refills   furosemide (LASIX) 20 MG tablet [Pharmacy Med Name: FUROSEMIDE 20 MG TABLET] 90 tablet 1    Sig: TAKE 1 TABLET BY MOUTH EVERY DAY    Date last filled: 11/16/2020 Previously prescribed by Dr Owens Shark  Lab Results  Component Value Date   HGBA1C 5.9 (H) 10/18/2020   Lab Results  Component Value Date   LDLCALC 124 (H) 10/18/2020   CREATININE 0.91 10/18/2020   Lab Results  Component Value Date   VD25OH 19.9 (L) 10/18/2020    BP Readings from Last 3 Encounters:  11/16/20 132/86  11/01/20 117/73  10/18/20 132/84

## 2020-12-19 DIAGNOSIS — M5136 Other intervertebral disc degeneration, lumbar region: Secondary | ICD-10-CM | POA: Diagnosis not present

## 2020-12-20 DIAGNOSIS — M5136 Other intervertebral disc degeneration, lumbar region: Secondary | ICD-10-CM | POA: Diagnosis not present

## 2020-12-20 DIAGNOSIS — F341 Dysthymic disorder: Secondary | ICD-10-CM | POA: Diagnosis not present

## 2020-12-20 DIAGNOSIS — M549 Dorsalgia, unspecified: Secondary | ICD-10-CM | POA: Diagnosis not present

## 2020-12-21 ENCOUNTER — Ambulatory Visit (INDEPENDENT_AMBULATORY_CARE_PROVIDER_SITE_OTHER): Payer: BC Managed Care – PPO | Admitting: Bariatrics

## 2020-12-21 ENCOUNTER — Encounter (INDEPENDENT_AMBULATORY_CARE_PROVIDER_SITE_OTHER): Payer: Self-pay | Admitting: Bariatrics

## 2020-12-21 ENCOUNTER — Other Ambulatory Visit: Payer: Self-pay

## 2020-12-21 VITALS — BP 121/81 | HR 60 | Temp 98.4°F | Ht 66.0 in | Wt 286.0 lb

## 2020-12-21 DIAGNOSIS — R600 Localized edema: Secondary | ICD-10-CM | POA: Diagnosis not present

## 2020-12-21 DIAGNOSIS — Z6841 Body Mass Index (BMI) 40.0 and over, adult: Secondary | ICD-10-CM | POA: Diagnosis not present

## 2020-12-21 DIAGNOSIS — R7303 Prediabetes: Secondary | ICD-10-CM

## 2020-12-21 NOTE — Progress Notes (Signed)
Chief Complaint:   OBESITY Tracy Meyer is here to discuss her progress with her obesity treatment plan along with follow-up of her obesity related diagnoses. Tracy Meyer is on the Category 1 Plan or the Category 2 Plan and states she is following her eating plan approximately 50% of the time. Tracy Meyer states she is doing physical therapy for 60 minutes 2 times per week.  Today's visit was #: 5 Starting weight: 282 lbs Starting date: 10/18/2020 Today's weight: 286 lbs Today's date: 12/21/2020 Total lbs lost to date: 0 Total lbs lost since last in-office visit: 1 lb  Interim History: Tracy Meyer is down 1 lb since her last visit. She states that she does have swelling in her feet chronic edema and she is on Lasix. She is doing well with her water intake.  Subjective:   1. Edema, lower extremity Tracy Meyer is currently on Lasix and HCTZ.  2. Prediabetes Tracy Meyer is currently not on medications.   Assessment/Plan:   1. Edema, lower extremity Tracy Meyer will continue her medications. She will continue physical therapy.   2. Prediabetes Tracy Meyer will continue to work on weight loss, exercise, and decreasing simple carbohydrates to help decrease the risk of diabetes. She will increase healthy fats and protein.   3. Obesity with current BMI of 46.5 Tracy Meyer is currently in the action stage of change. As such, her goal is to continue with weight loss efforts. She has agreed to the Category 1 Plan.   Tracy Meyer will continue meal planning and she will continue intentional eating. She will keep her water intake to 64 ounces daily and she will minimize her sodas. She will increase protein.  Exercise goals: No exercise has been prescribed at this time but, she is doing physical therapy due to back pain.  Behavioral modification strategies: increasing lean protein intake, decreasing simple carbohydrates, increasing vegetables, increasing water intake, decreasing eating out, no skipping meals, meal planning and cooking  strategies, keeping healthy foods in the home, and planning for success.  Tracy Meyer has agreed to follow-up with our clinic in 3 weeks with Jake Bathe, FNP or Mina Marble, NP and 6 weeks with myself. She was informed of the importance of frequent follow-up visits to maximize her success with intensive lifestyle modifications for her multiple health conditions.   Objective:   Blood pressure 121/81, pulse 60, temperature 98.4 F (36.9 C), height 5\' 6"  (1.676 m), weight 286 lb (129.7 kg), last menstrual period 04/15/2014, SpO2 99 %. Body mass index is 46.16 kg/m.  General: Cooperative, alert, well developed, in no acute distress. HEENT: Conjunctivae and lids unremarkable. Cardiovascular: Regular rhythm.  Lungs: Normal work of breathing. Neurologic: No focal deficits.   Lab Results  Component Value Date   CREATININE 0.91 10/18/2020   BUN 8 10/18/2020   NA 141 10/18/2020   K 4.0 10/18/2020   CL 102 10/18/2020   CO2 26 10/18/2020   Lab Results  Component Value Date   ALT 14 10/18/2020   AST 22 10/18/2020   ALKPHOS 76 10/18/2020   BILITOT 0.9 10/18/2020   Lab Results  Component Value Date   HGBA1C 5.9 (H) 10/18/2020   Lab Results  Component Value Date   INSULIN 9.6 10/18/2020   Lab Results  Component Value Date   TSH 1.990 10/18/2020   Lab Results  Component Value Date   CHOL 187 10/18/2020   HDL 39 (L) 10/18/2020   LDLCALC 124 (H) 10/18/2020   TRIG 133 10/18/2020   Lab Results  Component Value Date  VD25OH 19.9 (L) 10/18/2020   Lab Results  Component Value Date   WBC 6.7 10/04/2019   HGB 13.2 10/04/2019   HCT 40.7 10/04/2019   MCV 94.7 10/04/2019   PLT 202 10/04/2019   No results found for: IRON, TIBC, FERRITIN  Attestation Statements:   Reviewed by clinician on day of visit: allergies, medications, problem list, medical history, surgical history, family history, social history, and previous encounter notes.  I, Lizbeth Bark, RMA, am acting as  Location manager for [employer], DO.   I have reviewed the above documentation for accuracy and completeness, and I agree with the above. Jearld Lesch, DO

## 2020-12-23 DIAGNOSIS — M5136 Other intervertebral disc degeneration, lumbar region: Secondary | ICD-10-CM | POA: Diagnosis not present

## 2020-12-27 DIAGNOSIS — M5136 Other intervertebral disc degeneration, lumbar region: Secondary | ICD-10-CM | POA: Diagnosis not present

## 2020-12-29 DIAGNOSIS — M5136 Other intervertebral disc degeneration, lumbar region: Secondary | ICD-10-CM | POA: Diagnosis not present

## 2021-01-03 DIAGNOSIS — M5136 Other intervertebral disc degeneration, lumbar region: Secondary | ICD-10-CM | POA: Diagnosis not present

## 2021-01-10 ENCOUNTER — Encounter (INDEPENDENT_AMBULATORY_CARE_PROVIDER_SITE_OTHER): Payer: Self-pay | Admitting: Bariatrics

## 2021-01-10 DIAGNOSIS — M5136 Other intervertebral disc degeneration, lumbar region: Secondary | ICD-10-CM | POA: Diagnosis not present

## 2021-01-11 ENCOUNTER — Encounter (INDEPENDENT_AMBULATORY_CARE_PROVIDER_SITE_OTHER): Payer: Self-pay | Admitting: Family Medicine

## 2021-01-11 ENCOUNTER — Ambulatory Visit (INDEPENDENT_AMBULATORY_CARE_PROVIDER_SITE_OTHER): Payer: BC Managed Care – PPO | Admitting: Family Medicine

## 2021-01-11 ENCOUNTER — Other Ambulatory Visit: Payer: Self-pay

## 2021-01-11 VITALS — BP 117/66 | HR 72 | Temp 98.1°F | Ht 66.0 in | Wt 286.0 lb

## 2021-01-11 DIAGNOSIS — Z9189 Other specified personal risk factors, not elsewhere classified: Secondary | ICD-10-CM | POA: Diagnosis not present

## 2021-01-11 DIAGNOSIS — E559 Vitamin D deficiency, unspecified: Secondary | ICD-10-CM | POA: Diagnosis not present

## 2021-01-11 DIAGNOSIS — Z6841 Body Mass Index (BMI) 40.0 and over, adult: Secondary | ICD-10-CM | POA: Diagnosis not present

## 2021-01-11 MED ORDER — VITAMIN D (ERGOCALCIFEROL) 1.25 MG (50000 UNIT) PO CAPS: 50000.0000 [IU] | ORAL_CAPSULE | ORAL | 0 refills | Status: DC

## 2021-01-12 DIAGNOSIS — M5136 Other intervertebral disc degeneration, lumbar region: Secondary | ICD-10-CM | POA: Diagnosis not present

## 2021-01-12 NOTE — Progress Notes (Signed)
Chief Complaint:   OBESITY Tracy Meyer is here to discuss her progress with her obesity treatment plan along with follow-up of her obesity related diagnoses. Tracy Meyer is on the Category 1 Plan and states she is following her eating plan approximately 100% of the time. Tracy Meyer states she is doing physical therapy for 60 minutes 2 times per week.  Today's visit was #: 6 Starting weight: 282 lbs Starting date: 10/18/2020 Today's weight: 286 lbs Today's date: 01/11/2021 Total lbs lost to date: 0 Total lbs lost since last in-office visit: 0  Interim History: Tracy Meyer journals her food everyday, and she eats exactly 1200 calories every day. She has been keeping track for 3 weeks now. Her grams of protein is 40 grams per day on an average.  Subjective:   1. Vitamin D deficiency Tracy Meyer is currently taking prescription vitamin D 50,000 IU each week. She denies nausea, vomiting or muscle weakness.  2. At risk for malnutrition Tracy Meyer is at increased risk for malnutrition due to poor protein intake.  Assessment/Plan:  No orders of the defined types were placed in this encounter.   Medications Discontinued During This Encounter  Medication Reason   Vitamin D, Ergocalciferol, (DRISDOL) 1.25 MG (50000 UNIT) CAPS capsule Reorder     Meds ordered this encounter  Medications   Vitamin D, Ergocalciferol, (DRISDOL) 1.25 MG (50000 UNIT) CAPS capsule    Sig: Take 1 capsule (50,000 Units total) by mouth every 7 (seven) days.    Dispense:  4 capsule    Refill:  0     1. Vitamin D deficiency We will refill prescription Vitamin D for 1 month. [name] will follow-up for routine testing of Vitamin D, at least 2-3 times per year to avoid over-replacement.  - Vitamin D, Ergocalciferol, (DRISDOL) 1.25 MG (50000 UNIT) CAPS capsule; Take 1 capsule (50,000 Units total) by mouth every 7 (seven) days.  Dispense: 4 capsule; Refill: 0  2. At risk for malnutrition Tracy Meyer was given approximately 9 minutes of  counseling today regarding prevention of malnutrition and ways to meet macronutrient goals..   3. Obesity with current BMI of 46.2 Tracy Meyer is currently in the action stage of change. As such, her goal is to continue with weight loss efforts. She has agreed to the Category 1 Plan with protein equivalents and breakfast options.   Exercise goals: As is.  Behavioral modification strategies: holiday eating strategies  and avoiding temptations.  Tracy Meyer has agreed to follow-up with our clinic in 3 weeks with Dr. Owens Shark. She was informed of the importance of frequent follow-up visits to maximize her success with intensive lifestyle modifications for her multiple health conditions.   Objective:   Blood pressure 117/66, pulse 72, temperature 98.1 F (36.7 C), height 5\' 6"  (1.676 m), weight 286 lb (129.7 kg), last menstrual period 04/15/2014, SpO2 99 %. Body mass index is 46.16 kg/m.  General: Cooperative, alert, well developed, in no acute distress. HEENT: Conjunctivae and lids unremarkable. Cardiovascular: Regular rhythm.  Lungs: Normal work of breathing. Neurologic: No focal deficits.   Lab Results  Component Value Date   CREATININE 0.91 10/18/2020   BUN 8 10/18/2020   NA 141 10/18/2020   K 4.0 10/18/2020   CL 102 10/18/2020   CO2 26 10/18/2020   Lab Results  Component Value Date   ALT 14 10/18/2020   AST 22 10/18/2020   ALKPHOS 76 10/18/2020   BILITOT 0.9 10/18/2020   Lab Results  Component Value Date   HGBA1C 5.9 (H) 10/18/2020  Lab Results  Component Value Date   INSULIN 9.6 10/18/2020   Lab Results  Component Value Date   TSH 1.990 10/18/2020   Lab Results  Component Value Date   CHOL 187 10/18/2020   HDL 39 (L) 10/18/2020   LDLCALC 124 (H) 10/18/2020   TRIG 133 10/18/2020   Lab Results  Component Value Date   VD25OH 19.9 (L) 10/18/2020   Lab Results  Component Value Date   WBC 6.7 10/04/2019   HGB 13.2 10/04/2019   HCT 40.7 10/04/2019   MCV 94.7  10/04/2019   PLT 202 10/04/2019   No results found for: IRON, TIBC, FERRITIN  Attestation Statements:   Reviewed by clinician on day of visit: allergies, medications, problem list, medical history, surgical history, family history, social history, and previous encounter notes.   Wilhemena Durie, am acting as transcriptionist for [employer], DO.  I have reviewed the above documentation for accuracy and completeness, and I agree with the above. Marjory Sneddon, D.O.  The [place] was signed into law in 2016 which includes the topic of electronic health records.  This provides immediate access to information in MyChart.  This includes consultation notes, operative notes, office notes, lab results and pathology reports.  If you have any questions about what you read please let us know at your next visit so we can discuss your concerns and take corrective action if need be.  We are right here with you.

## 2021-01-26 DIAGNOSIS — M5136 Other intervertebral disc degeneration, lumbar region: Secondary | ICD-10-CM | POA: Diagnosis not present

## 2021-01-30 DIAGNOSIS — Z79891 Long term (current) use of opiate analgesic: Secondary | ICD-10-CM | POA: Diagnosis not present

## 2021-01-30 DIAGNOSIS — Z79899 Other long term (current) drug therapy: Secondary | ICD-10-CM | POA: Diagnosis not present

## 2021-01-30 DIAGNOSIS — M79604 Pain in right leg: Secondary | ICD-10-CM | POA: Diagnosis not present

## 2021-01-30 DIAGNOSIS — M5136 Other intervertebral disc degeneration, lumbar region: Secondary | ICD-10-CM | POA: Diagnosis not present

## 2021-01-30 DIAGNOSIS — G894 Chronic pain syndrome: Secondary | ICD-10-CM | POA: Diagnosis not present

## 2021-01-30 DIAGNOSIS — M4727 Other spondylosis with radiculopathy, lumbosacral region: Secondary | ICD-10-CM | POA: Diagnosis not present

## 2021-01-31 ENCOUNTER — Other Ambulatory Visit: Payer: Self-pay | Admitting: Pain Medicine

## 2021-01-31 DIAGNOSIS — G8929 Other chronic pain: Secondary | ICD-10-CM

## 2021-01-31 DIAGNOSIS — M545 Low back pain, unspecified: Secondary | ICD-10-CM

## 2021-02-01 ENCOUNTER — Ambulatory Visit (INDEPENDENT_AMBULATORY_CARE_PROVIDER_SITE_OTHER): Payer: BC Managed Care – PPO | Admitting: Bariatrics

## 2021-02-13 DIAGNOSIS — J029 Acute pharyngitis, unspecified: Secondary | ICD-10-CM | POA: Diagnosis not present

## 2021-02-13 DIAGNOSIS — H109 Unspecified conjunctivitis: Secondary | ICD-10-CM | POA: Diagnosis not present

## 2021-02-13 DIAGNOSIS — Z03818 Encounter for observation for suspected exposure to other biological agents ruled out: Secondary | ICD-10-CM | POA: Diagnosis not present

## 2021-02-15 ENCOUNTER — Telehealth (INDEPENDENT_AMBULATORY_CARE_PROVIDER_SITE_OTHER): Payer: BC Managed Care – PPO | Admitting: Bariatrics

## 2021-02-15 ENCOUNTER — Encounter (INDEPENDENT_AMBULATORY_CARE_PROVIDER_SITE_OTHER): Payer: Self-pay | Admitting: Bariatrics

## 2021-02-15 DIAGNOSIS — R7303 Prediabetes: Secondary | ICD-10-CM

## 2021-02-15 DIAGNOSIS — E559 Vitamin D deficiency, unspecified: Secondary | ICD-10-CM

## 2021-02-15 DIAGNOSIS — I10 Essential (primary) hypertension: Secondary | ICD-10-CM

## 2021-02-15 DIAGNOSIS — Z6841 Body Mass Index (BMI) 40.0 and over, adult: Secondary | ICD-10-CM

## 2021-02-15 MED ORDER — VITAMIN D (ERGOCALCIFEROL) 1.25 MG (50000 UNIT) PO CAPS: 50000.0000 [IU] | ORAL_CAPSULE | ORAL | 0 refills | Status: DC

## 2021-02-15 NOTE — Progress Notes (Addendum)
TeleHealth Visit:  Due to the COVID-19 pandemic, this visit was completed with telemedicine (audio/video) technology to reduce patient and provider exposure as well as to preserve personal protective equipment.   Tracy Meyer has verbally consented to this TeleHealth visit. The patient is located at home, the provider is located at the [facility] office. The participants in this visit include the listed provider and patient. The visit was conducted today via telephone.  Tracy Meyer was unable to use realtime audiovisual technology today and the telehealth visit was conducted via telephone.  Chief Complaint: OBESITY Tracy Meyer is here to discuss her progress with her obesity treatment plan along with follow-up of her obesity related diagnoses. Tracy Meyer is on the Category 1 Plan and states she is following her eating plan most of the time.   Today's visit was #: 7 Starting weight: 282 lbs Starting date: 10/18/2020  Interim History: Tracy Meyer is down 2 lbs per patient. She states that she is sick today and requested a "MyChart" visit.  Subjective:   1. Pre-diabetes Tracy Meyer is currently not taking medications.  2. Essential hypertension Tracy Meyer's blood pressure is stable. Her last blood was 117/66.  3. Vitamin D deficiency Tracy Meyer is taking Vitamin D currently.   Assessment/Plan:   1. Pre-diabetes Tracy Meyer will continue keeping carbohydrates low (sweets and starches) and she will continue to work on weight loss, exercise, and to help decrease the risk of diabetes.   2. Essential hypertension Tracy Meyer will continue taking her medications. She is working on healthy weight loss and exercise to improve blood pressure control. We will watch for signs of hypotension as she continues her lifestyle modifications.  3. Vitamin D deficiency Low Vitamin D level contributes to fatigue and are associated with obesity, breast, and colon cancer. We will refill prescription Vitamin D 50,000 IU every week  for 1 month with no refills and Tracy Meyer will follow-up for routine testing of Vitamin D, at least 2-3 times per year to avoid over-replacement.  - Vitamin D, Ergocalciferol, (DRISDOL) 1.25 MG (50000 UNIT) CAPS capsule; Take 1 capsule (50,000 Units total) by mouth every 7 (seven) days.  Dispense: 4 capsule; Refill: 0  4. Obesity with current BMI of 46.2 Tensley is currently in the action stage of change. As such, her goal is to continue with weight loss efforts. She has agreed to the Category 1 Plan.   Elishia will continue meal planning. She will adhere closely to the plan.  Exercise goals:  Tracy Meyer is seeing pain management and going for an MRI.   Behavioral modification strategies: increasing lean protein intake, decreasing simple carbohydrates, increasing vegetables, increasing water intake, decreasing eating out, no skipping meals, meal planning and cooking strategies, keeping healthy foods in the home, and planning for success.  Tracy Meyer has agreed to follow-up with our clinic in 2-3 weeks (fasting). She was informed of the importance of frequent follow-up visits to maximize her success with intensive lifestyle modifications for her multiple health conditions.  Objective:   VITALS: Per patient if applicable, see vitals. GENERAL: Alert and in no acute distress. CARDIOPULMONARY: No increased WOB. Speaking in clear sentences.  PSYCH: Pleasant and cooperative. Speech normal rate and rhythm. Affect is appropriate. Insight and judgement are appropriate. Attention is focused, linear, and appropriate.  NEURO: Oriented as arrived to appointment on time with no prompting.   Lab Results  Component Value Date   CREATININE 0.91 10/18/2020   BUN 8 10/18/2020   NA 141 10/18/2020   K 4.0 10/18/2020  CL 102 10/18/2020   CO2 26 10/18/2020   Lab Results  Component Value Date   ALT 14 10/18/2020   AST 22 10/18/2020   ALKPHOS 76 10/18/2020   BILITOT 0.9 10/18/2020   Lab Results  Component Value  Date   HGBA1C 5.9 (H) 10/18/2020   Lab Results  Component Value Date   INSULIN 9.6 10/18/2020   Lab Results  Component Value Date   TSH 1.990 10/18/2020   Lab Results  Component Value Date   CHOL 187 10/18/2020   HDL 39 (L) 10/18/2020   LDLCALC 124 (H) 10/18/2020   TRIG 133 10/18/2020   Lab Results  Component Value Date   VD25OH 19.9 (L) 10/18/2020   Lab Results  Component Value Date   WBC 6.7 10/04/2019   HGB 13.2 10/04/2019   HCT 40.7 10/04/2019   MCV 94.7 10/04/2019   PLT 202 10/04/2019   No results found for: IRON, TIBC, FERRITIN  Attestation Statements:   Reviewed by clinician on day of visit: allergies, medications, problem list, medical history, surgical history, family history, social history, and previous encounter notes.  I, Lizbeth Bark, RMA, am acting as Location manager for [employer], DO.  The pre-visit care, the visit, including post-visit care was 30 minutes.   I have reviewed the above documentation for accuracy and completeness, and I agree with the above. Jearld Lesch, DO

## 2021-02-16 ENCOUNTER — Encounter (INDEPENDENT_AMBULATORY_CARE_PROVIDER_SITE_OTHER): Payer: Self-pay | Admitting: Bariatrics

## 2021-02-28 ENCOUNTER — Ambulatory Visit
Admission: RE | Admit: 2021-02-28 | Discharge: 2021-02-28 | Disposition: A | Discharge status: Home / Self Care | Payer: BC Managed Care – PPO | Source: Ambulatory Visit | Attending: Pain Medicine | Admitting: Pain Medicine

## 2021-02-28 DIAGNOSIS — M48061 Spinal stenosis, lumbar region without neurogenic claudication: Secondary | ICD-10-CM | POA: Diagnosis not present

## 2021-02-28 DIAGNOSIS — M4327 Fusion of spine, lumbosacral region: Secondary | ICD-10-CM | POA: Diagnosis not present

## 2021-02-28 DIAGNOSIS — M545 Low back pain, unspecified: Secondary | ICD-10-CM | POA: Diagnosis not present

## 2021-02-28 DIAGNOSIS — G8929 Other chronic pain: Secondary | ICD-10-CM

## 2021-03-14 DIAGNOSIS — M5416 Radiculopathy, lumbar region: Secondary | ICD-10-CM | POA: Diagnosis not present

## 2021-03-16 ENCOUNTER — Encounter (INDEPENDENT_AMBULATORY_CARE_PROVIDER_SITE_OTHER): Payer: Self-pay | Admitting: Family Medicine

## 2021-03-16 ENCOUNTER — Other Ambulatory Visit: Payer: Self-pay

## 2021-03-16 ENCOUNTER — Ambulatory Visit (INDEPENDENT_AMBULATORY_CARE_PROVIDER_SITE_OTHER): Payer: BC Managed Care – PPO | Admitting: Family Medicine

## 2021-03-16 ENCOUNTER — Ambulatory Visit (INDEPENDENT_AMBULATORY_CARE_PROVIDER_SITE_OTHER): Payer: BC Managed Care – PPO | Admitting: Bariatrics

## 2021-03-16 VITALS — BP 140/86 | HR 72 | Temp 98.4°F | Ht 66.0 in | Wt 294.0 lb

## 2021-03-16 DIAGNOSIS — R7303 Prediabetes: Secondary | ICD-10-CM

## 2021-03-16 DIAGNOSIS — I10 Essential (primary) hypertension: Secondary | ICD-10-CM

## 2021-03-16 DIAGNOSIS — E538 Deficiency of other specified B group vitamins: Secondary | ICD-10-CM

## 2021-03-16 DIAGNOSIS — E559 Vitamin D deficiency, unspecified: Secondary | ICD-10-CM

## 2021-03-16 DIAGNOSIS — E669 Obesity, unspecified: Secondary | ICD-10-CM

## 2021-03-16 DIAGNOSIS — Z9189 Other specified personal risk factors, not elsewhere classified: Secondary | ICD-10-CM

## 2021-03-16 DIAGNOSIS — Z6841 Body Mass Index (BMI) 40.0 and over, adult: Secondary | ICD-10-CM

## 2021-03-18 ENCOUNTER — Other Ambulatory Visit (INDEPENDENT_AMBULATORY_CARE_PROVIDER_SITE_OTHER): Payer: Self-pay | Admitting: Bariatrics

## 2021-03-18 DIAGNOSIS — R600 Localized edema: Secondary | ICD-10-CM

## 2021-03-19 MED ORDER — VITAMIN D (ERGOCALCIFEROL) 1.25 MG (50000 UNIT) PO CAPS
50000.0000 [IU] | ORAL_CAPSULE | ORAL | 0 refills | Status: AC
Start: 2021-03-19 — End: ?

## 2021-03-19 NOTE — Patient Instructions (Signed)
ascvd

## 2021-03-20 NOTE — Telephone Encounter (Signed)
Dr.Opalski ?

## 2021-03-20 NOTE — Progress Notes (Signed)
Chief Complaint:   OBESITY Tracy Meyer is here to discuss her progress with her obesity treatment plan along with follow-up of her obesity related diagnoses. Tracy Meyer is on the Category 1 Plan and states she is following her eating plan approximately 60% of the time. Tracy Meyer states she is not currently exercising.  Today's visit was #: 8 Starting weight: 282 lbs Starting date: 10/18/2020 Today's weight: 294 lbs Today's date: 03/16/2021 Total lbs lost to date: 0 Total lbs lost since last in-office visit: +8  Interim History:  Tracy Meyer established with Dr Owens Shark and that's whom she usually sees.    Her last in-office visit was 01/11/2021 and for the first time with me.   (Last seen by Dr Owens Shark 1/25 via video. )   Pt has gained 12.5 pounds in fat mass since then.  -She has been journaling her intake on her own in a notebook but forgot to bring it in for our review today.  -She endorses she has been unable to exercise due to a back injury and concerns with ongoing lower extremity edema.  She see's a back specialist and states they told her to walk to improve her symptoms.    Subjective:   1. Prediabetes Pt has not really followed the meal plan, thus she is not sure if she has hunger or cravings while on meal plan.  She does eat carb-rich items.  Labs 10/18/2020- A1c 5.9 and insulin level 9.6.   She declines labs today and is not fasting   2. Essential hypertension Pt endorses HCTZ is a daily medication. She is not checking BP at home but has no symptoms or concerns. She does have chronic lower extremity edema. Medication: HCTZ, Lasix prn   3. Vitamin D deficiency - Pt endorses she takes OTC Vit D and a multivitamin only.  -  Her last Vit D level on 10/18/2020 was low at 19.9.   - In the past, Dr. Owens Shark wrote for 50K Ergocalciferol starting at her 2nd OV.  Her last refill was 02/15/2021. -She declines labs today   4. B12 deficiency Pt is taking OTC B12. Her last check was low at 263 on 10/18/20.  She doesn't know dose of OTC B12. Pt was told by Dr. Owens Shark in the past to take 1,000 mcg. -She declines labs today  5. At risk for diabetes mellitus Tracy Meyer is at risk for diabetes mellitus due to pre-diabetes and insulin resistance and current eating habits.    Assessment/Plan:   Medications Discontinued During This Encounter  Medication Reason   Vitamin D, Ergocalciferol, (DRISDOL) 1.25 MG (50000 UNIT) CAPS capsule Reorder     Meds ordered this encounter  Medications   Vitamin D, Ergocalciferol, (DRISDOL) 1.25 MG (50000 UNIT) CAPS capsule    Sig: Take 1 capsule (50,000 Units total) by mouth every 7 (seven) days.    Dispense:  4 capsule    Refill:  0     1. Prediabetes -Labs: not at goal -She declines labs today and is not fasting for insulin level Last labs 5 months ago; needs repeat labs in the near future. We discussed various meds can help but I recommend pt get on meal plan and we can reassess her sx in the future.   2. Essential hypertension -BP above goal today.  - take all meds as written and be consistent with intake Decrease salt intake to no more than 1000 mg QD.  Start walking 5-10 minutes QD and follow prudent nutritional plan.  Repeat labs in the near future. She declines labs today    3. Vitamin D deficiency - Not at goal / poorly controlled  - notified/ reminded pt she is on a script for wkly vit D supplementation - I again reiterated the importance of vitamin D (as well as calcium) to their health and wellbeing.  - I reviewed possible symptoms of low Vitamin D:  low energy, depressed mood, muscle aches, joint aches, osteoporosis etc. - low Vitamin D levels may be linked to an increased risk of cardiovascular events and even increased risk of cancers- such as colon and breast.  - ideal vitamin D levels reviewed with patient : 29- 56 - I recommend pt take weekly prescription vit D regularly - see script below   - Informed patient this may be a lifelong  thing, and she was encouraged to continue to take the medicine until told otherwise.    - weight loss will likely improve availability of vitamin D, thus encouraged Tracy Meyer to continue with meal plan and their weight loss efforts to further improve this condition.  Thus, we will need to monitor levels regularly (every 3-4 mo on average) to keep levels within normal limits and prevent over supplementation. - pt's questions and concerns regarding this condition addressed.  - Repeat labs at next OV.  Refill- Vitamin D, Ergocalciferol, (DRISDOL) 1.25 MG (50000 UNIT) CAPS capsule; Take 1 capsule (50,000 Units total) by mouth every 7 (seven) days.  Dispense: 4 capsule; Refill: 0   4. B12 deficiency Continue OTC supplement and repeat labs at next OV. B12 def counseling done   5. At risk for diabetes mellitus - Tracy Meyer was given diabetes prevention education and counseling today of more than 23 minutes.  - Counseled patient on pathophysiology of disease and meaning/ implication of lab results.  - Reviewed how certain foods can either stimulate or inhibit insulin release, and subsequently affect hunger pathways  - Importance of following a healthy meal plan with limiting amounts of simple carbohydrates discussed with patient - Effects of regular aerobic exercise on blood sugar regulation reviewed and encouraged an eventual goal of 30 min 5d/week or more as a minimum.  - Briefly discussed treatment options, which always include dietary and lifestyle modification as first line.   - Handouts provided at patient's desire and/or told to go online to the American Diabetes Association website for further information.  6. Obesity with current BMI of 47.5 Tracy Meyer is currently in the action stage of change. As such, her goal is to continue with weight loss efforts. She has agreed to Change to keeping a food journal and adhering to recommended goals of 1000-1100 calories and 80+ grams protein.   - Journal all  intake with weighing and measuring foods to improve self-awareness of what these foods are doing to her body.   - Come to next OV fasting for possible repeat blood work    Exercise goals:  Start walking 5-10 minutes per day.  Behavioral modification strategies: decreasing simple carbohydrates, decreasing sodium intake, and keeping a strict food journal.  Tracy Meyer has agreed to follow-up with our clinic in 3 weeks with Dr. Owens Shark. She was informed of the importance of frequent follow-up visits to maximize her success with intensive lifestyle modifications for her multiple health conditions.     Objective:   Blood pressure 140/86, pulse 72, temperature 98.4 F (36.9 C), height 5\' 6"  (1.676 m), weight 294 lb (133.4 kg), last menstrual period 04/15/2014, SpO2 96 %. Body mass index  is 47.45 kg/m. General: Cooperative, alert, well developed, in no acute distress. HEENT: Conjunctivae and lids unremarkable. Cardiovascular: Regular rhythm.  Lungs: Normal work of breathing. Neurologic: No focal deficits.   Lab Results  Component Value Date   CREATININE 0.91 10/18/2020   BUN 8 10/18/2020   NA 141 10/18/2020   K 4.0 10/18/2020   CL 102 10/18/2020   CO2 26 10/18/2020   Lab Results  Component Value Date   ALT 14 10/18/2020   AST 22 10/18/2020   ALKPHOS 76 10/18/2020   BILITOT 0.9 10/18/2020   Lab Results  Component Value Date   HGBA1C 5.9 (H) 10/18/2020   Lab Results  Component Value Date   INSULIN 9.6 10/18/2020   Lab Results  Component Value Date   TSH 1.990 10/18/2020   Lab Results  Component Value Date   CHOL 187 10/18/2020   HDL 39 (L) 10/18/2020   LDLCALC 124 (H) 10/18/2020   TRIG 133 10/18/2020   Lab Results  Component Value Date   VD25OH 19.9 (L) 10/18/2020   Lab Results  Component Value Date   WBC 6.7 10/04/2019   HGB 13.2 10/04/2019   HCT 40.7 10/04/2019   MCV 94.7 10/04/2019   PLT 202 10/04/2019   Attestation Statements:   Reviewed by clinician on  day of visit: allergies, medications, problem list, medical history, surgical history, family history, social history, and previous encounter notes.  Coral Ceo, CMA, am acting as transcriptionist for [employer], DO.  I have reviewed the above documentation for accuracy and completeness, and I agree with the above. Marjory Sneddon, D.O.  The [place] was signed into law in 2016 which includes the topic of electronic health records.  This provides immediate access to information in MyChart.  This includes consultation notes, operative notes, office notes, lab results and pathology reports.  If you have any questions about what you read please let us know at your next visit so we can discuss your concerns and take corrective action if need be.  We are right here with you.

## 2021-03-21 ENCOUNTER — Encounter (INDEPENDENT_AMBULATORY_CARE_PROVIDER_SITE_OTHER): Payer: Self-pay

## 2021-03-21 DIAGNOSIS — M5136 Other intervertebral disc degeneration, lumbar region: Secondary | ICD-10-CM | POA: Diagnosis not present

## 2021-03-21 DIAGNOSIS — M4727 Other spondylosis with radiculopathy, lumbosacral region: Secondary | ICD-10-CM | POA: Diagnosis not present

## 2021-03-21 DIAGNOSIS — R200 Anesthesia of skin: Secondary | ICD-10-CM | POA: Diagnosis not present

## 2021-03-21 DIAGNOSIS — M79604 Pain in right leg: Secondary | ICD-10-CM | POA: Diagnosis not present

## 2021-03-21 NOTE — Telephone Encounter (Signed)
MyChart message sent to pt to find out if they have enough medication to get them through until next appt.   

## 2021-04-06 ENCOUNTER — Ambulatory Visit (INDEPENDENT_AMBULATORY_CARE_PROVIDER_SITE_OTHER): Payer: BC Managed Care – PPO | Admitting: Family Medicine

## 2021-04-20 ENCOUNTER — Ambulatory Visit (INDEPENDENT_AMBULATORY_CARE_PROVIDER_SITE_OTHER): Payer: BC Managed Care – PPO | Admitting: Family Medicine

## 2021-04-24 DIAGNOSIS — R600 Localized edema: Secondary | ICD-10-CM | POA: Diagnosis not present

## 2021-05-10 DIAGNOSIS — M7989 Other specified soft tissue disorders: Secondary | ICD-10-CM | POA: Diagnosis not present

## 2021-05-10 DIAGNOSIS — F341 Dysthymic disorder: Secondary | ICD-10-CM | POA: Diagnosis not present

## 2021-05-10 DIAGNOSIS — R600 Localized edema: Secondary | ICD-10-CM | POA: Diagnosis not present

## 2021-05-10 DIAGNOSIS — M519 Unspecified thoracic, thoracolumbar and lumbosacral intervertebral disc disorder: Secondary | ICD-10-CM | POA: Diagnosis not present

## 2021-05-11 ENCOUNTER — Other Ambulatory Visit: Payer: Self-pay | Admitting: Internal Medicine

## 2021-05-11 DIAGNOSIS — I872 Venous insufficiency (chronic) (peripheral): Secondary | ICD-10-CM

## 2021-05-12 ENCOUNTER — Other Ambulatory Visit (INDEPENDENT_AMBULATORY_CARE_PROVIDER_SITE_OTHER): Payer: Self-pay | Admitting: Family Medicine

## 2021-05-12 DIAGNOSIS — E559 Vitamin D deficiency, unspecified: Secondary | ICD-10-CM

## 2021-05-25 ENCOUNTER — Ambulatory Visit
Admission: RE | Admit: 2021-05-25 | Discharge: 2021-05-25 | Disposition: A | Discharge status: Home / Self Care | Payer: BC Managed Care – PPO | Source: Ambulatory Visit | Attending: Internal Medicine | Admitting: Internal Medicine

## 2021-05-25 DIAGNOSIS — M7989 Other specified soft tissue disorders: Secondary | ICD-10-CM | POA: Diagnosis not present

## 2021-05-25 DIAGNOSIS — I872 Venous insufficiency (chronic) (peripheral): Secondary | ICD-10-CM

## 2021-05-31 DIAGNOSIS — R600 Localized edema: Secondary | ICD-10-CM | POA: Diagnosis not present

## 2021-05-31 DIAGNOSIS — I519 Heart disease, unspecified: Secondary | ICD-10-CM | POA: Diagnosis not present

## 2021-06-22 DIAGNOSIS — Z23 Encounter for immunization: Secondary | ICD-10-CM | POA: Diagnosis not present

## 2021-06-22 DIAGNOSIS — E559 Vitamin D deficiency, unspecified: Secondary | ICD-10-CM | POA: Diagnosis not present

## 2021-06-22 DIAGNOSIS — R7303 Prediabetes: Secondary | ICD-10-CM | POA: Diagnosis not present

## 2021-06-22 DIAGNOSIS — Z13220 Encounter for screening for lipoid disorders: Secondary | ICD-10-CM | POA: Diagnosis not present

## 2021-06-22 DIAGNOSIS — Z0000 Encounter for general adult medical examination without abnormal findings: Secondary | ICD-10-CM | POA: Diagnosis not present

## 2021-06-22 DIAGNOSIS — I10 Essential (primary) hypertension: Secondary | ICD-10-CM | POA: Diagnosis not present

## 2021-06-26 DIAGNOSIS — Z6841 Body Mass Index (BMI) 40.0 and over, adult: Secondary | ICD-10-CM | POA: Diagnosis not present

## 2021-06-26 DIAGNOSIS — I10 Essential (primary) hypertension: Secondary | ICD-10-CM | POA: Diagnosis not present

## 2021-06-26 DIAGNOSIS — R7303 Prediabetes: Secondary | ICD-10-CM | POA: Diagnosis not present

## 2021-08-30 ENCOUNTER — Encounter (INDEPENDENT_AMBULATORY_CARE_PROVIDER_SITE_OTHER): Payer: Self-pay

## 2021-12-27 DIAGNOSIS — Z23 Encounter for immunization: Secondary | ICD-10-CM | POA: Diagnosis not present

## 2021-12-27 DIAGNOSIS — I10 Essential (primary) hypertension: Secondary | ICD-10-CM | POA: Diagnosis not present

## 2021-12-27 DIAGNOSIS — R7303 Prediabetes: Secondary | ICD-10-CM | POA: Diagnosis not present

## 2021-12-27 DIAGNOSIS — Z6841 Body Mass Index (BMI) 40.0 and over, adult: Secondary | ICD-10-CM | POA: Diagnosis not present

## 2022-04-09 IMAGING — MR MR LUMBAR SPINE W/O CM
4 of 5 series · 18 of 48 positions shown · non-contrast
Comparison: 11/08/2017

CLINICAL DATA: Low back pain radiating into both legs.

Chronic low back pain, unspecified back pain laterality, unspecified
whether sciatica present. M54.50,
EXAM:
MRI LUMBAR SPINE WITHOUT CONTRAST
TECHNIQUE: Multiplanar, multisequence MR imaging of the lumbar spine was
performed. No intravenous contrast was administered.

[Series 6: T2 · sagittal · 4.0mm · 0.73mm/px · 6 of 13 slices shown (1 of 2)]
[im 1/13]
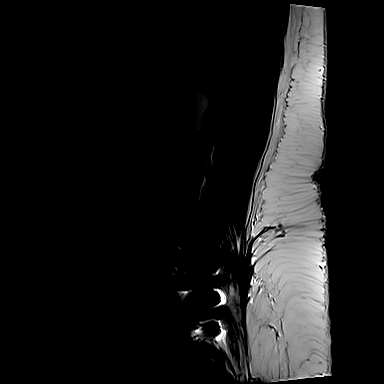
[im 3/13]
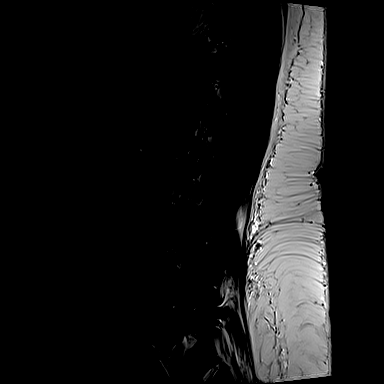
[im 5/13]
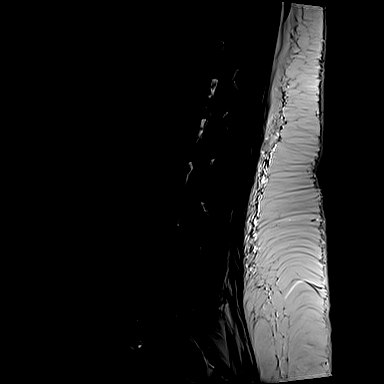
[im 8/13]
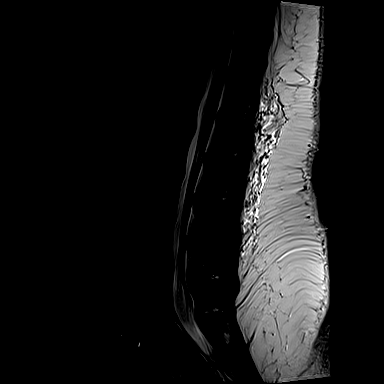
[im 10/13]
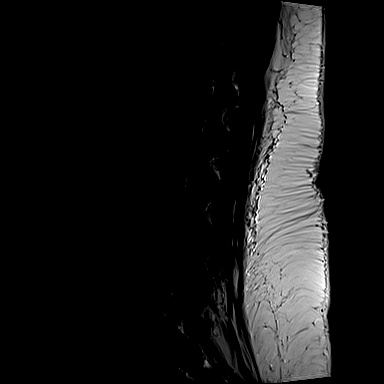
[im 13/13]
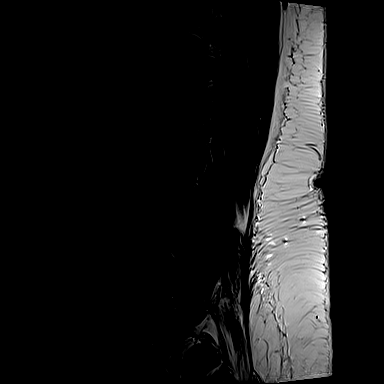

[Series 7: T1 · sagittal · 4.0mm · 0.73mm/px · 3 of 13 slices shown (1 of 2)]
[im 1/13]
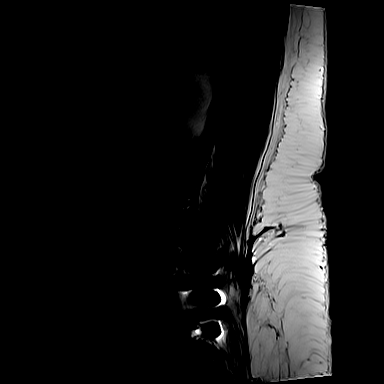
[im 7/13]
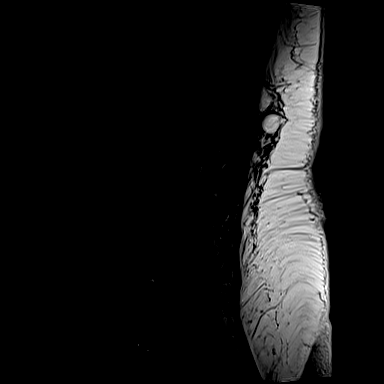
[im 13/13]
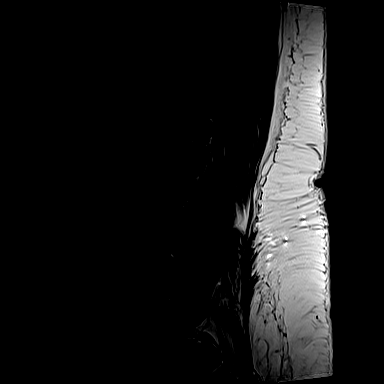

[Series 11: T2 · axial · 4.0mm · 0.28mm/px · z∈[+6,+184]mm · 6 of 39 slices shown (2 of 2)]
[im 3/39]
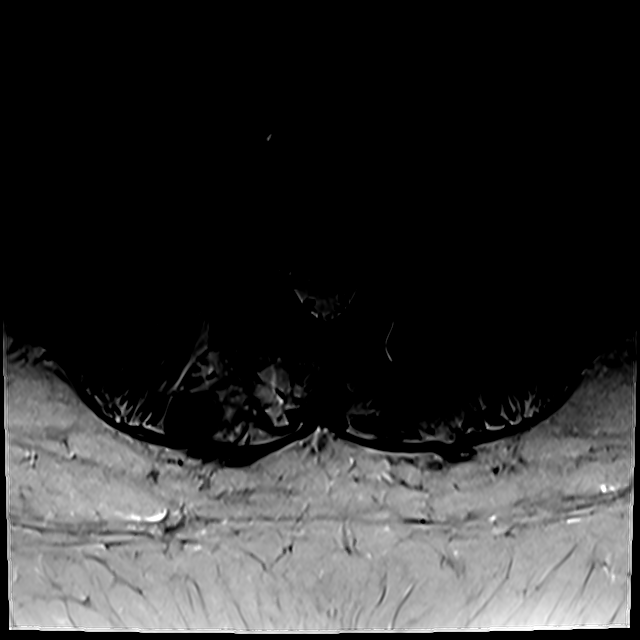
[im 6/39]
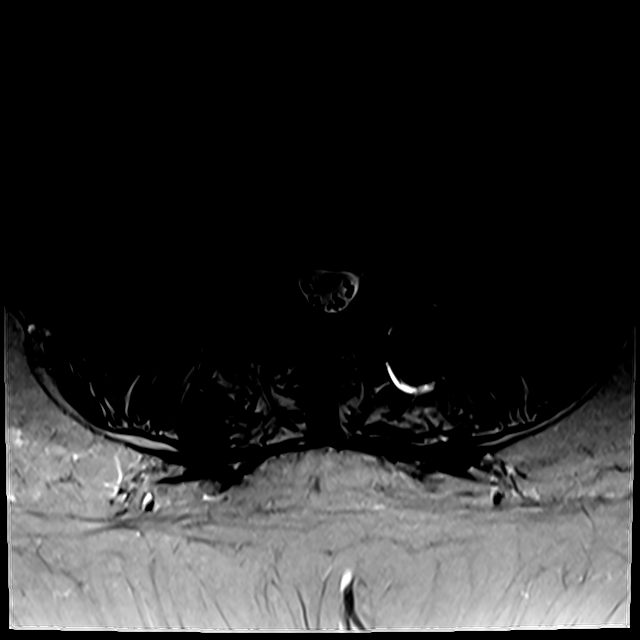
[im 8/39]
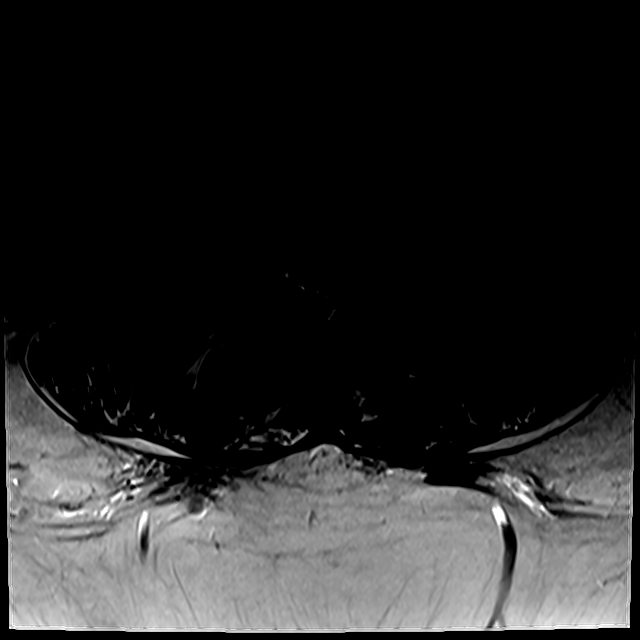
[im 13/39]
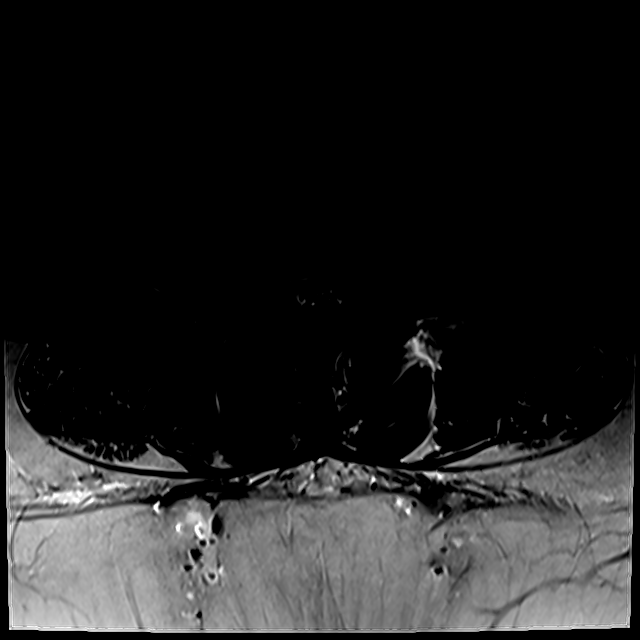
[im 21/39]
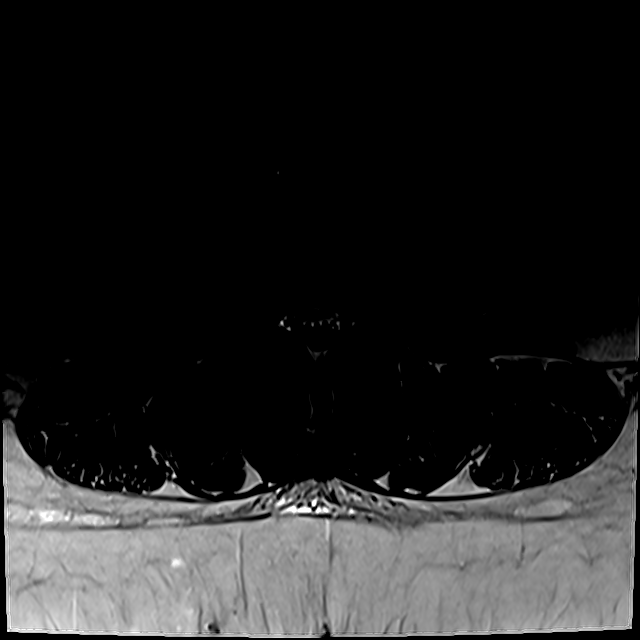
[im 33/39]
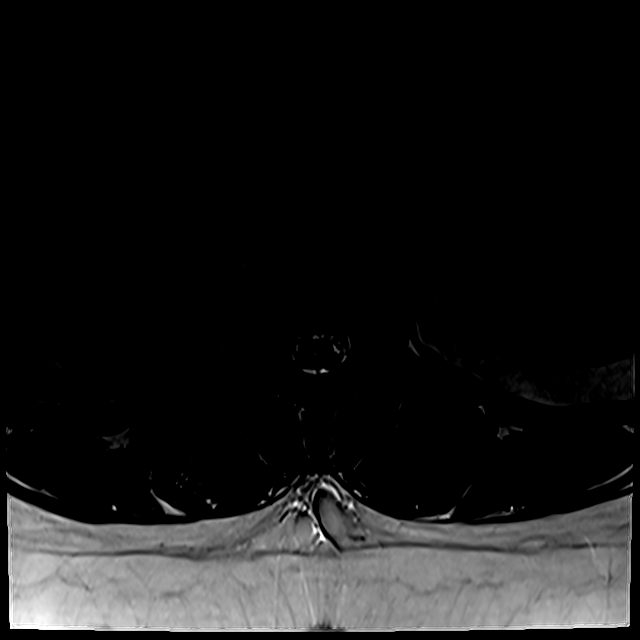

[Series 14: T1 · axial · 4.0mm · 0.28mm/px · z∈[+21,+184]mm · 3 of 39 slices shown (2 of 2)]
[im 6/39]
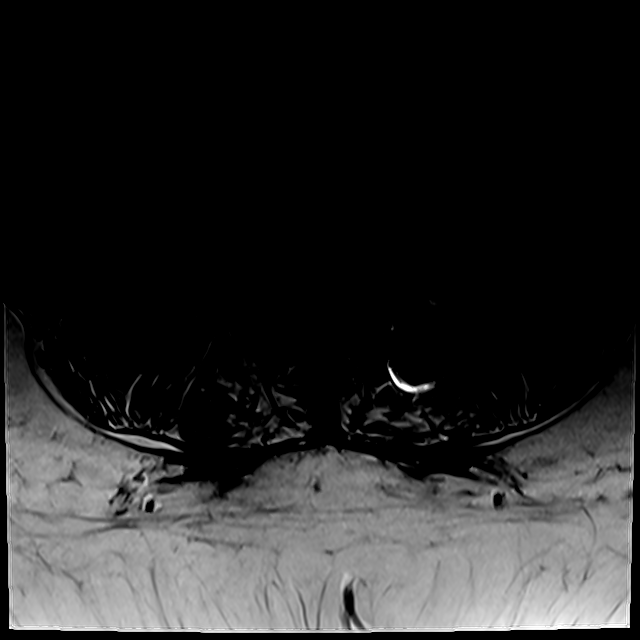
[im 21/39]
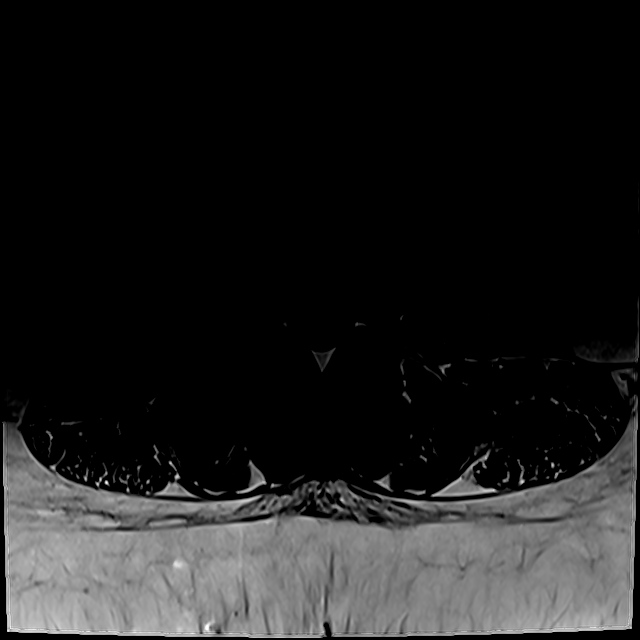
[im 33/39]
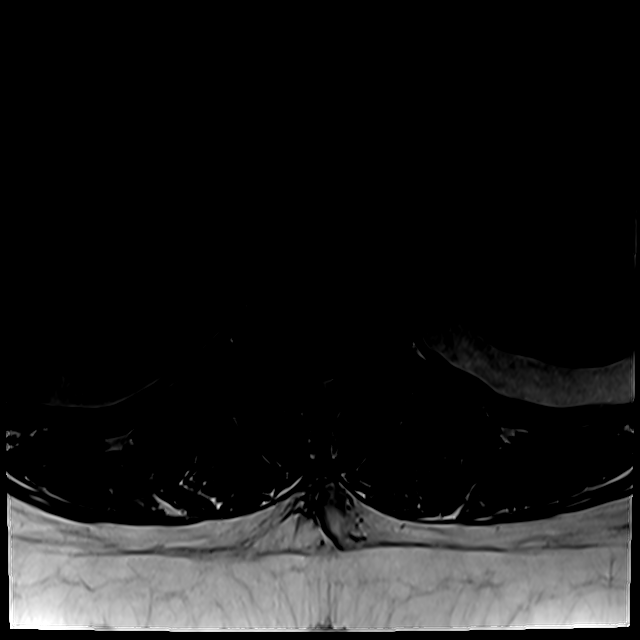

[18 of 48 positions shown; findings below may reference images not displayed]

FINDINGS: Segmentation:  Standard.

Alignment:  Physiologic.

Vertebrae: Anterior and posterior instrumented fusion at L5-S1 with
interbody spacer. Modic type 2 endplate changes at T11-12. No
fracture or acute abnormality.

Conus medullaris and cauda equina: Conus extends to the L2 level.
Conus and cauda equina appear normal.

Paraspinal and other soft tissues: Right renal cyst now measures
cm, previously 3.9 cm.

Disc levels:

Disc levels from T12-L4 are normal.

L4-5: Small left asymmetric disc bulge, unchanged with mild left
foraminal stenosis.

L5-S1: Postfusion changes with widely patent spinal canal. Neural
foraminal patency appears improved, particularly on the right. But,
there is still moderate bilateral stenosis.
IMPRESSION: 1. L5-S1 anterior and posterior instrumented fusion with widely
patent spinal canal. Neural foraminal patency appears improved,
particularly on the right.
2. Unchanged mild left L4-5 foraminal stenosis.

## 2022-06-25 DIAGNOSIS — R0981 Nasal congestion: Secondary | ICD-10-CM | POA: Diagnosis not present

## 2022-06-26 DIAGNOSIS — E559 Vitamin D deficiency, unspecified: Secondary | ICD-10-CM | POA: Diagnosis not present

## 2022-06-26 DIAGNOSIS — I10 Essential (primary) hypertension: Secondary | ICD-10-CM | POA: Diagnosis not present

## 2022-06-26 DIAGNOSIS — Z6841 Body Mass Index (BMI) 40.0 and over, adult: Secondary | ICD-10-CM | POA: Diagnosis not present

## 2022-06-26 DIAGNOSIS — R7303 Prediabetes: Secondary | ICD-10-CM | POA: Diagnosis not present

## 2022-06-26 DIAGNOSIS — Z13220 Encounter for screening for lipoid disorders: Secondary | ICD-10-CM | POA: Diagnosis not present

## 2022-06-26 DIAGNOSIS — K219 Gastro-esophageal reflux disease without esophagitis: Secondary | ICD-10-CM | POA: Diagnosis not present

## 2022-06-26 DIAGNOSIS — Z0000 Encounter for general adult medical examination without abnormal findings: Secondary | ICD-10-CM | POA: Diagnosis not present

## 2022-06-28 ENCOUNTER — Other Ambulatory Visit: Payer: Self-pay | Admitting: Internal Medicine

## 2022-06-28 DIAGNOSIS — Z1231 Encounter for screening mammogram for malignant neoplasm of breast: Secondary | ICD-10-CM

## 2022-07-02 ENCOUNTER — Emergency Department (HOSPITAL_COMMUNITY)
Admission: EM | Admit: 2022-07-02 | Discharge: 2022-07-02 | Disposition: A | Discharge status: Home / Self Care | Payer: BC Managed Care – PPO | Attending: Emergency Medicine | Admitting: Emergency Medicine

## 2022-07-02 ENCOUNTER — Other Ambulatory Visit: Payer: Self-pay

## 2022-07-02 ENCOUNTER — Encounter (HOSPITAL_COMMUNITY): Payer: Self-pay

## 2022-07-02 DIAGNOSIS — R490 Dysphonia: Secondary | ICD-10-CM | POA: Diagnosis not present

## 2022-07-02 DIAGNOSIS — H8302 Labyrinthitis, left ear: Secondary | ICD-10-CM | POA: Diagnosis not present

## 2022-07-02 DIAGNOSIS — J040 Acute laryngitis: Secondary | ICD-10-CM | POA: Insufficient documentation

## 2022-07-02 NOTE — ED Provider Notes (Signed)
[place] EMERGENCY DEPARTMENT AT [facility] Provider Note   CSN: [id number] Arrival date & time: 07/02/22  0636     History  Chief Complaint  Patient presents with   Ear Fullness    Tracy Meyer is a 57 y.o. female history of chronic cholecystitis, prediabetic presented with 1 month of left ear fullness and hoarseness of voice that began a few days ago.  Patient states that she recently had sinusitis and was treated with Zithromax however still endorses symptoms.  Patient states that she has decreased hearing on the left side but denies any dizziness, tinnitus.  Patient also notes that over the past 2 days she has had hoarseness in her voice.  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  Patient states that she is able to eat and drink without issue and has tried Claritin for few days but still endorses symptoms.  Patient denied chest pain, shortness of breath, headache, neck pain, AMS, fevers, feeling unstable, jaw claudication  Home Medications Prior to Admission medications   Medication Sig Start Date End Date Taking? Authorizing Provider  furosemide (LASIX) 20 MG tablet TAKE 1 TABLET BY MOUTH EVERY DAY 12/12/20   Corinna Capra A, DO  hydrochlorothiazide (MICROZIDE) 12.5 MG capsule Take 12.5 mg by mouth daily.    [provider]  pregabalin (LYRICA) 50 MG capsule Take 50 mg by mouth 2 (two) times daily.    [provider]  Vitamin D, Ergocalciferol, (DRISDOL) 1.25 MG (50000 UNIT) CAPS capsule Take 1 capsule (50,000 Units total) by mouth every 7 (seven) days. 03/19/21   Thomasene Lot, DO      Allergies    Shellfish allergy    Review of Systems   Review of Systems See HPI Physical Exam Updated Vital Signs BP (!) 159/109 (BP Location: Right Arm)   Pulse 87   Temp 98.3 F (36.8 C) (Oral)   Resp 18   Ht 5\' 6"  (1.676 m)   Wt 136.1 kg   LMP 04/15/2014   SpO2 96%   BMI 48.42 kg/m  Physical  Exam Constitutional:      General: She is not in acute distress. HENT:     Head: Normocephalic and atraumatic.     Right Ear: Tympanic membrane, ear canal and external ear normal. There is no impacted cerumen.     Left Ear: Ear canal and external ear normal. There is no impacted cerumen.     Ears:     Comments: Fluid noted behind left tympanic membrane No vesicles noted No mastoid tenderness, area of fluctuance, skin color changes    Nose: Nose normal.     Mouth/Throat:     Mouth: Mucous membranes are moist.     Pharynx: No posterior oropharyngeal erythema.     Comments: No oropharynx swelling Eyes:     Extraocular Movements: Extraocular movements intact.     Conjunctiva/sclera: Conjunctivae normal.     Pupils: Pupils are equal, round, and reactive to light.  Neck:     Comments: No neck edema Cardiovascular:     Rate and Rhythm: Normal rate and regular rhythm.     Pulses: Normal pulses.     Heart sounds: Normal heart sounds.  Pulmonary:     Effort: Pulmonary effort is normal. No respiratory distress.     Breath sounds: Normal breath sounds.  Musculoskeletal:        General: Normal range of motion.  Cervical back: Normal range of motion and neck supple. No rigidity or tenderness.  Skin:    General: Skin is warm and dry.  Neurological:     General: No focal deficit present.     Mental Status: She is alert and oriented to person, place, and time.     Sensory: Sensation is intact.     Motor: Motor function is intact.     Coordination: Coordination is intact.     Gait: Gait is intact.     Comments: Cranial nerves III through XII intact Vision grossly intact  Psychiatric:        Mood and Affect: Mood normal.     ED Results / Procedures / Treatments   Labs (all labs ordered are listed, but only abnormal results are displayed) Labs Reviewed - No data to display  EKG None  Radiology No results found.  Procedures Procedures    Medications Ordered in  ED Medications - No data to display  ED Course/ Medical Decision Making/ A&P                             Medical Decision Making  Tracy Meyer 57 y.o. presented today for left ear fullness and hoarseness. Working DDx that I considered at this time includes, but not limited to, labyrinthitis, laryngitis, vestibular neuroma, perforated tympanic membrane, supportive AOM, mastoiditis, airway compromise, BP, CVA.  R/o DDx: vestibular neuroma, perforated tympanic membrane, supportive AOM, mastoiditis, airway compromise, BP, CVA: These are considered less likely due to history of present illness and physical exam findings  Review of prior external notes: 06/26/2022 unknown  Unique Tests and My Interpretation: None  Discussion with Independent Historian: None  Discussion of Management of Tests: None  Risk: Medium: prescription drug management  Risk Stratification Score: None  Plan: Patient presented for ear fullness and hoarseness. On exam patient was in no acute distress and stable vitals.  Patient stated that she is hard of hearing however was able to hear out of both ears with erythema covered without issue during the encounter.  Patient did have hoarseness in her voice.  Patient's neuroexam was unremarkable.  Patient states that she does use her voice often and is appropriate for a singing group and so suspect patient most likely has laryngitis from use of her voice and recent URI.  Patient's left panic membrane did appear to have fluid behind it however did not appear to be supportive.  No other abnormalities were noted in patient's ENT exam including mastoid tenderness/skin color changes, area of fluctuance.  At this time I suspect patient has acute labyrinthitis due to recent sinusitis.  Due to patient being in a prediabetic state we discussed the use of steroids and ultimately as a group decided against it as they may raise her sugar.  Encouraged patient to use Claritin/Zyrtec/Allegra in  the morning and Benadryl at night for symptoms and to rest her voice and to remain hydrated.  Patient requested ENT follow-up and patient will be provided this.  Patient be given a work note and encouraged to return if symptoms change or worsen.  Patient was given return precautions. Patient stable for discharge at this time.  Patient verbalized understanding of plan.         Final Clinical Impression(s) / ED Diagnoses Final diagnoses:  Labyrinthitis of left ear  Laryngitis    Rx / DC Orders ED Discharge Orders     None  Netta Corrigan, PA-C 07/02/22 0756    Tegeler, Canary Brim, MD 07/02/22 1039

## 2022-07-02 NOTE — ED Triage Notes (Addendum)
Pt. Arrives stating that her ears are so clogged that she cannot hear. Pt. States that this has been going on for a month and she was just recently seen by her doctor Monday and Tuesday. She has not had any relief. She also states that she has had swelling and tightness to the left side of her face.

## 2022-07-02 NOTE — Discharge Instructions (Signed)
Please follow-up with the ENT specialist I have attached your for you.  You may also follow-up with your primary care provider as well.  You may take Claritin, Zyrtec, Allegra as prescribed daily to help with the ear fullness and use Benadryl at night.  I have also given you a work note for 2 weeks as well.  Please rest your voice over the next 2 weeks and communicate minimally via voice.  You may use your phone to type out sentences but please do not saying, speak in extensive conversations, or use her voice excessively.  Please remain hydrated and if symptoms change please return to ER.

## 2022-07-31 ENCOUNTER — Ambulatory Visit: Payer: BC Managed Care – PPO

## 2022-08-01 ENCOUNTER — Ambulatory Visit
Admission: RE | Admit: 2022-08-01 | Discharge: 2022-08-01 | Disposition: A | Discharge status: Home / Self Care | Payer: BC Managed Care – PPO | Source: Ambulatory Visit | Attending: Internal Medicine | Admitting: Internal Medicine

## 2022-08-01 DIAGNOSIS — Z1231 Encounter for screening mammogram for malignant neoplasm of breast: Secondary | ICD-10-CM | POA: Diagnosis not present

## 2023-02-26 NOTE — Progress Notes (Signed)
 This encounter was created in error - please disregard.

## 2023-05-14 ENCOUNTER — Ambulatory Visit (INDEPENDENT_AMBULATORY_CARE_PROVIDER_SITE_OTHER): Admitting: Podiatry

## 2023-05-14 ENCOUNTER — Encounter: Payer: Self-pay | Admitting: Podiatry

## 2023-05-14 DIAGNOSIS — M7752 Other enthesopathy of left foot: Secondary | ICD-10-CM

## 2023-05-14 DIAGNOSIS — M7751 Other enthesopathy of right foot: Secondary | ICD-10-CM

## 2023-05-14 DIAGNOSIS — Q666 Other congenital valgus deformities of feet: Secondary | ICD-10-CM

## 2023-05-14 MED ORDER — MELOXICAM 15 MG PO TABS: 15.0000 mg | ORAL_TABLET | Freq: Every day | ORAL | 3 refills | Status: AC

## 2023-05-14 MED ORDER — METHYLPREDNISOLONE 4 MG PO TBPK: ORAL_TABLET | ORAL | 0 refills | Status: DC

## 2023-05-14 MED ORDER — TRIAMCINOLONE ACETONIDE 40 MG/ML IJ SUSP
20.0000 mg | Freq: Once | INTRAMUSCULAR | Status: AC
  Administered 2023-05-14: 20 mg

## 2023-05-15 NOTE — Progress Notes (Signed)
 Subjective:  Patient ID: Tracy Meyer, female    DOB: [birth year],  MRN: [id number] HPI Chief Complaint  Patient presents with   Ankle Pain    Anterior/lateral ankle bilateral - swelling and pain x couple months, wears compression stockings, 8/10 pain, tried meloxicam , gabapentin, goodies, tylenol , taking lasix  for the swelling   New Patient (Initial Visit)    Est pt 2017    58 y.o. female presents with the above complaint.   ROS: Denies fever chills nausea vomiting muscle aches pains calf pain back pain chest pain shortness of breath.  Past Medical History:  Diagnosis Date   B12 deficiency    Back pain    Depression    Edema, lower extremity    High blood pressure    Pneumonia    PONV (postoperative nausea and vomiting)    Prediabetes    Vitamin D  deficiency    Wears contact lenses    Wears glasses    Past Surgical History:  Procedure Laterality Date   ABDOMINAL EXPOSURE N/A 06/25/2018   Procedure: ABDOMINAL EXPOSURE;  Surgeon: Mayo Speck, MD;  Location: [facility] OR;  Service: Vascular;  Laterality: N/A;   ANTERIOR LUMBAR FUSION N/A 06/25/2018   Procedure: ANTERIOR LUMBAR INTERBODY FUSION LUMBAR 5 - SACRUM 1 WITH INSTRUMENTATION AND ALLOGRAFT;  Surgeon: Virl Grimes, MD;  Location: [facility] OR;  Service: Orthopedics;  Laterality: N/A;   BIOPSY  01/01/2020   Procedure: BIOPSY;  Surgeon: Felecia Hopper, MD;  Location: [facility];  Service: Gastroenterology;;   BREAST BIOPSY  58 years old   cyst removal   CHOLECYSTECTOMY     COLONOSCOPY WITH PROPOFOL  N/A 01/01/2020   Procedure: COLONOSCOPY WITH PROPOFOL ;  Surgeon: Felecia Hopper, MD;  Location: [facility];  Service: Gastroenterology;  Laterality: N/A;   ESOPHAGOGASTRODUODENOSCOPY (EGD) WITH PROPOFOL  N/A 01/01/2020   Procedure: ESOPHAGOGASTRODUODENOSCOPY (EGD) WITH PROPOFOL ;  Surgeon: Felecia Hopper, MD;  Location: [facility];  Service: Gastroenterology;  Laterality: N/A;   HARDWARE REMOVAL N/A 02/25/2019   Procedure:  HARDWARE REMOVAL;  Surgeon: Virl Grimes, MD;  Location: [facility] OR;  Service: Orthopedics;  Laterality: N/A;   LUMBAR LAMINECTOMY/DECOMPRESSION MICRODISCECTOMY N/A 02/25/2019   Procedure: LUMBAR 5 - SACRUM 1 DECOMPRESSION WITH REMOVAL OF HARDWARE;  Surgeon: Virl Grimes, MD;  Location: [facility] OR;  Service: Orthopedics;  Laterality: N/A;   POLYPECTOMY  01/01/2020   Procedure: POLYPECTOMY;  Surgeon: Felecia Hopper, MD;  Location: [facility];  Service: Gastroenterology;;    Current Outpatient Medications:    diclofenac (VOLTAREN) 75 MG EC tablet, Take 75 mg by mouth 2 (two) times daily as needed., Disp: , Rfl:    meloxicam  (MOBIC ) 15 MG tablet, Take 1 tablet (15 mg total) by mouth daily., Disp: 30 tablet, Rfl: 3   methylPREDNISolone  (MEDROL  DOSEPAK) 4 MG TBPK tablet, 6 day dose pack - take as directed, Disp: 21 tablet, Rfl: 0   furosemide  (LASIX ) 20 MG tablet, TAKE 1 TABLET BY MOUTH EVERY DAY, Disp: 90 tablet, Rfl: 0   hydrochlorothiazide (MICROZIDE) 12.5 MG capsule, Take 12.5 mg by mouth daily., Disp: , Rfl:    pregabalin (LYRICA) 50 MG capsule, Take 50 mg by mouth 2 (two) times daily., Disp: , Rfl:    Vitamin D , Ergocalciferol , (DRISDOL ) 1.25 MG (50000 UNIT) CAPS capsule, Take 1 capsule (50,000 Units total) by mouth every 7 (seven) days., Disp: 4 capsule, Rfl: 0  Allergies  Allergen Reactions   Shellfish Allergy Anaphylaxis   Review of Systems Objective:  There were no vitals filed for this visit.  General: Well developed, nourished, in no acute distress, alert and oriented x3   Dermatological: Skin is warm, dry and supple bilateral. Nails x 10 are well maintained; remaining integument appears unremarkable at this time. There are no open sores, no preulcerative lesions, no rash or signs of infection present.  Vascular: Dorsalis Pedis artery and Posterior Tibial artery pedal pulses are 2/4 bilateral with immedate capillary fill time. Pedal hair growth present. No varicosities and no lower  extremity edema present bilateral.   Neruologic: Grossly intact via light touch bilateral. Vibratory intact via tuning fork bilateral. Protective threshold with Semmes Wienstein monofilament intact to all pedal sites bilateral. Patellar and Achilles deep tendon reflexes 2+ bilateral. No Babinski or clonus noted bilateral.   Musculoskeletal: No gross boney pedal deformities bilateral. No pain, crepitus, or limitation noted with foot and ankle range of motion bilateral. Muscular strength 5/5 in all groups tested bilateral.  Pain on palpation of the sinus tarsi of the right foot.  Pain on end range of motion of subtalar joint right foot.  Pain on palpation of the fourth fifth tarsometatarsal joint right over left.  Pes planovalgus flexible in nature.  Gait: Unassisted, Nonantalgic.    Radiographs:  None taken  Assessment & Plan:   Assessment: Subtalar joint capsulitis pes planovalgus.  Plan: Started on methylprednisolone  to be followed by meloxicam .  I injected subtalar joint right foot.     Tracy Meyer T. [place], [place]

## 2023-06-13 ENCOUNTER — Ambulatory Visit (INDEPENDENT_AMBULATORY_CARE_PROVIDER_SITE_OTHER): Admitting: Podiatry

## 2023-06-13 DIAGNOSIS — M7751 Other enthesopathy of right foot: Secondary | ICD-10-CM

## 2023-06-13 DIAGNOSIS — M7752 Other enthesopathy of left foot: Secondary | ICD-10-CM

## 2023-06-13 DIAGNOSIS — Q666 Other congenital valgus deformities of feet: Secondary | ICD-10-CM

## 2023-06-13 MED ORDER — TRIAMCINOLONE ACETONIDE 40 MG/ML IJ SUSP
40.0000 mg | Freq: Once | INTRAMUSCULAR | Status: AC
  Administered 2023-06-13: 40 mg

## 2023-06-17 NOTE — Progress Notes (Signed)
 She presents today for follow-up of her ankle pain.  She states that she is doing some better but is still tender.  Objective: Vital signs are stable alert oriented x 3.  She still has pain on end range of motion of the subtalar joints bilaterally but is much more minimal than previously noted.  Assessment: Subtalar joint capsulitis.  Plan: Injected the subtalar joint today after sterile Betadine  skin prep injected 10 mg each with local anesthetic.  Tolerated procedure well without complications.  Follow-up with me on an as-needed basis.

## 2023-08-19 ENCOUNTER — Other Ambulatory Visit: Payer: Self-pay | Admitting: Internal Medicine

## 2023-08-19 DIAGNOSIS — Z1231 Encounter for screening mammogram for malignant neoplasm of breast: Secondary | ICD-10-CM

## 2023-09-10 ENCOUNTER — Ambulatory Visit
Admission: RE | Admit: 2023-09-10 | Discharge: 2023-09-10 | Disposition: A | Discharge status: Home / Self Care | Source: Ambulatory Visit | Attending: Internal Medicine | Admitting: Internal Medicine

## 2023-09-10 DIAGNOSIS — Z1231 Encounter for screening mammogram for malignant neoplasm of breast: Secondary | ICD-10-CM

## 2024-02-25 ENCOUNTER — Inpatient Hospital Stay (HOSPITAL_COMMUNITY): Payer: Self-pay

## 2024-02-25 ENCOUNTER — Inpatient Hospital Stay (HOSPITAL_COMMUNITY)
Admission: EM | Admit: 2024-02-25 | Payer: Self-pay | Source: Home / Self Care | Attending: Hospitalist | Admitting: Hospitalist

## 2024-02-25 ENCOUNTER — Encounter (HOSPITAL_COMMUNITY): Payer: Self-pay

## 2024-02-25 ENCOUNTER — Other Ambulatory Visit: Payer: Self-pay

## 2024-02-25 ENCOUNTER — Emergency Department (HOSPITAL_COMMUNITY): Payer: Self-pay

## 2024-02-25 DIAGNOSIS — R0609 Other forms of dyspnea: Secondary | ICD-10-CM

## 2024-02-25 DIAGNOSIS — J069 Acute upper respiratory infection, unspecified: Principal | ICD-10-CM

## 2024-02-25 LAB — RESPIRATORY PANEL BY PCR

## 2024-02-25 LAB — CBC
HCT: 41.7 % (ref 36.0–46.0)
Hemoglobin: 13.6 g/dL (ref 12.0–15.0)
MCH: 29.6 pg (ref 26.0–34.0)
MCHC: 32.6 g/dL (ref 30.0–36.0)
MCV: 90.8 fL (ref 80.0–100.0)
Platelets: 336 10*3/uL (ref 150–400)
RBC: 4.59 MIL/uL (ref 3.87–5.11)
RDW: 13.3 % (ref 11.5–15.5)
WBC: 11.6 10*3/uL — ABNORMAL HIGH (ref 4.0–10.5)
nRBC: 0 % (ref 0.0–0.2)

## 2024-02-25 LAB — CBC WITH DIFFERENTIAL/PLATELET
Abs Immature Granulocytes: 0.06 10*3/uL (ref 0.00–0.07)
Basophils Absolute: 0 10*3/uL (ref 0.0–0.1)
Basophils Relative: 0 %
Eosinophils Absolute: 0.3 10*3/uL (ref 0.0–0.5)
Eosinophils Relative: 3 %
HCT: 42.6 % (ref 36.0–46.0)
Hemoglobin: 13.7 g/dL (ref 12.0–15.0)
Immature Granulocytes: 1 %
Lymphocytes Relative: 49 %
Lymphs Abs: 4.5 10*3/uL — ABNORMAL HIGH (ref 0.7–4.0)
MCH: 29.8 pg (ref 26.0–34.0)
MCHC: 32.2 g/dL (ref 30.0–36.0)
MCV: 92.8 fL (ref 80.0–100.0)
Monocytes Absolute: 0.7 10*3/uL (ref 0.1–1.0)
Monocytes Relative: 8 %
Neutro Abs: 3.6 10*3/uL (ref 1.7–7.7)
Neutrophils Relative %: 39 %
Platelets: 325 10*3/uL (ref 150–400)
RBC: 4.59 MIL/uL (ref 3.87–5.11)
RDW: 13.4 % (ref 11.5–15.5)
WBC: 9.1 10*3/uL (ref 4.0–10.5)
nRBC: 0 % (ref 0.0–0.2)

## 2024-02-25 LAB — COMPREHENSIVE METABOLIC PANEL WITH GFR
ALT: 21 U/L (ref 0–44)
AST: 30 U/L (ref 15–41)
Albumin: 4 g/dL (ref 3.5–5.0)
Alkaline Phosphatase: 102 U/L (ref 38–126)
Anion gap: 13 (ref 5–15)
BUN: 9 mg/dL (ref 6–20)
CO2: 27 mmol/L (ref 22–32)
Calcium: 9.2 mg/dL (ref 8.9–10.3)
Chloride: 102 mmol/L (ref 98–111)
Creatinine, Ser: 0.92 mg/dL (ref 0.44–1.00)
GFR, Estimated: >60 mL/min
Glucose, Bld: 123 mg/dL — ABNORMAL HIGH (ref 70–99)
Potassium: 4.2 mmol/L (ref 3.5–5.1)
Sodium: 142 mmol/L (ref 135–145)
Total Bilirubin: 0.8 mg/dL (ref 0.0–1.2)
Total Protein: 7.9 g/dL (ref 6.5–8.1)

## 2024-02-25 LAB — RESP PANEL BY RT-PCR (RSV, FLU A&B, COVID)  RVPGX2
Influenza A by PCR: NEGATIVE
Influenza B by PCR: NEGATIVE
Resp Syncytial Virus by PCR: NEGATIVE
SARS Coronavirus 2 by RT PCR: NEGATIVE

## 2024-02-25 LAB — ECHOCARDIOGRAM COMPLETE BUBBLE STUDY
AV Mean grad: 11 mmHg
AV Peak grad: 20.3 mmHg
Ao pk vel: 2.25 m/s
S' Lateral: 2.1 cm

## 2024-02-25 LAB — CREATININE, SERUM
Creatinine, Ser: 0.89 mg/dL (ref 0.44–1.00)
GFR, Estimated: >60 mL/min

## 2024-02-25 LAB — HEMOGLOBIN A1C
Hgb A1c MFr Bld: 6.1 % — ABNORMAL HIGH (ref 4.8–5.6)
Mean Plasma Glucose: 128.37 mg/dL

## 2024-02-25 LAB — PRO BRAIN NATRIURETIC PEPTIDE: Pro Brain Natriuretic Peptide: 122 pg/mL

## 2024-02-25 LAB — TROPONIN T, HIGH SENSITIVITY: Troponin T High Sensitivity: 11 ng/L (ref 0–19)

## 2024-02-25 MED ORDER — ACETAMINOPHEN 325 MG PO TABS
650.0000 mg | ORAL_TABLET | Freq: Four times a day (QID) | ORAL | Status: DC | PRN
  Administered 2024-02-25 – 2024-02-27 (×4): 650 mg via ORAL
  Filled 2024-02-25 (×4): qty 2

## 2024-02-25 MED ORDER — FUROSEMIDE 10 MG/ML IJ SOLN
40.0000 mg | Freq: Once | INTRAMUSCULAR | Status: AC
  Administered 2024-02-25: 40 mg via INTRAVENOUS
  Filled 2024-02-25: qty 4

## 2024-02-25 MED ORDER — LOSARTAN POTASSIUM 50 MG PO TABS
25.0000 mg | ORAL_TABLET | Freq: Every day | ORAL | Status: AC
  Administered 2024-02-25 – 2024-02-28 (×4): 25 mg via ORAL
  Filled 2024-02-25 (×4): qty 1

## 2024-02-25 MED ORDER — IPRATROPIUM-ALBUTEROL 0.5-2.5 (3) MG/3ML IN SOLN: 3.0000 mL | Freq: Four times a day (QID) | RESPIRATORY_TRACT | Status: DC | PRN

## 2024-02-25 MED ORDER — ENOXAPARIN SODIUM 60 MG/0.6ML IJ SOSY
60.0000 mg | PREFILLED_SYRINGE | INTRAMUSCULAR | Status: AC
  Administered 2024-02-26 – 2024-02-28 (×3): 60 mg via SUBCUTANEOUS
  Filled 2024-02-25 (×3): qty 0.6

## 2024-02-25 MED ORDER — ALBUTEROL SULFATE (2.5 MG/3ML) 0.083% IN NEBU
5.0000 mg | INHALATION_SOLUTION | Freq: Once | RESPIRATORY_TRACT | Status: AC
  Administered 2024-02-25: 5 mg via RESPIRATORY_TRACT
  Filled 2024-02-25: qty 6

## 2024-02-25 NOTE — ED Notes (Signed)
 Patient unable to ambulate greater than 10 ft from room and return without stopping twice. SpO2 decreased from 95% at rest on room air to 94% with standing. Working of breathing significantly increased with exertion. Patient is recovering quickly in bed but continues to present with increased work of breathing.

## 2024-02-25 NOTE — ED Notes (Signed)
 Patient transported to X-ray

## 2024-02-25 NOTE — Progress Notes (Signed)
" °  Echocardiogram 2D Echocardiogram has been performed.  Tracy Meyer 02/25/2024, 5:29 PM "

## 2024-02-25 NOTE — ED Notes (Signed)
 Patient alert and oriented able to speak in full, clear and coherent sentence with labored respiration.  Patient denies dizziness at rest but with exertion comes quickly. Patient reports pain related to coughing.

## 2024-02-25 NOTE — ED Notes (Signed)
Floor notified.

## 2024-02-25 NOTE — ED Notes (Signed)
 ECHO at bedside.

## 2024-02-26 LAB — HIV ANTIBODY (ROUTINE TESTING W REFLEX): HIV Screen 4th Generation wRfx: NONREACTIVE

## 2024-02-26 MED ORDER — PANTOPRAZOLE SODIUM 40 MG PO TBEC
40.0000 mg | DELAYED_RELEASE_TABLET | Freq: Two times a day (BID) | ORAL | Status: AC
  Administered 2024-02-26 – 2024-02-28 (×5): 40 mg via ORAL
  Filled 2024-02-26 (×5): qty 1

## 2024-02-26 MED ORDER — BUDESONIDE 0.25 MG/2ML IN SUSP
0.2500 mg | Freq: Two times a day (BID) | RESPIRATORY_TRACT | Status: AC
  Administered 2024-02-26 – 2024-02-28 (×5): 0.25 mg via RESPIRATORY_TRACT
  Filled 2024-02-26 (×5): qty 2

## 2024-02-26 MED ORDER — PROCHLORPERAZINE EDISYLATE 10 MG/2ML IJ SOLN
5.0000 mg | Freq: Four times a day (QID) | INTRAMUSCULAR | Status: AC | PRN
  Filled 2024-02-26: qty 2

## 2024-02-26 MED ORDER — DIPHENHYDRAMINE HCL 50 MG/ML IJ SOLN
25.0000 mg | Freq: Once | INTRAMUSCULAR | Status: AC
  Administered 2024-02-26: 25 mg via INTRAVENOUS
  Filled 2024-02-26: qty 1

## 2024-02-26 MED ORDER — IPRATROPIUM-ALBUTEROL 0.5-2.5 (3) MG/3ML IN SOLN
3.0000 mL | Freq: Four times a day (QID) | RESPIRATORY_TRACT | Status: DC
  Administered 2024-02-26 – 2024-02-27 (×4): 3 mL via RESPIRATORY_TRACT
  Filled 2024-02-26 (×4): qty 3

## 2024-02-26 MED ORDER — GUAIFENESIN ER 600 MG PO TB12
600.0000 mg | ORAL_TABLET | Freq: Two times a day (BID) | ORAL | Status: AC
  Administered 2024-02-26 – 2024-02-28 (×6): 600 mg via ORAL
  Filled 2024-02-26 (×6): qty 1

## 2024-02-26 MED ORDER — HYDROCOD POLI-CHLORPHE POLI ER 10-8 MG/5ML PO SUER
5.0000 mL | Freq: Two times a day (BID) | ORAL | Status: AC | PRN
  Administered 2024-02-27 – 2024-02-28 (×2): 5 mL via ORAL
  Filled 2024-02-26 (×2): qty 5

## 2024-02-26 MED ORDER — PROCHLORPERAZINE EDISYLATE 10 MG/2ML IJ SOLN
10.0000 mg | Freq: Once | INTRAMUSCULAR | Status: AC
  Administered 2024-02-26: 10 mg via INTRAVENOUS
  Filled 2024-02-26: qty 2

## 2024-02-26 MED ORDER — HYDROCODONE-ACETAMINOPHEN 5-325 MG PO TABS
1.0000 | ORAL_TABLET | Freq: Four times a day (QID) | ORAL | Status: AC | PRN
  Administered 2024-02-26 – 2024-02-28 (×6): 1 via ORAL
  Filled 2024-02-26 (×6): qty 1

## 2024-02-26 MED ORDER — ARFORMOTEROL TARTRATE 15 MCG/2ML IN NEBU
15.0000 ug | INHALATION_SOLUTION | Freq: Two times a day (BID) | RESPIRATORY_TRACT | Status: AC
  Administered 2024-02-26 – 2024-02-28 (×5): 15 ug via RESPIRATORY_TRACT
  Filled 2024-02-26 (×5): qty 2

## 2024-02-26 NOTE — Plan of Care (Signed)
  Problem: Education: Goal: Knowledge of General Education information will improve Description: Including pain rating scale, medication(s)/side effects and non-pharmacologic comfort measures Outcome: Progressing   Problem: Health Behavior/Discharge Planning: Goal: Ability to manage health-related needs will improve Outcome: Progressing   Problem: Nutrition: Goal: Adequate nutrition will be maintained Outcome: Progressing   Problem: Coping: Goal: Level of anxiety will decrease Outcome: Progressing   Problem: Elimination: Goal: Will not experience complications related to bowel motility Outcome: Progressing Goal: Will not experience complications related to urinary retention Outcome: Progressing   Problem: Pain Managment: Goal: General experience of comfort will improve and/or be controlled Outcome: Progressing   Problem: Safety: Goal: Ability to remain free from injury will improve Outcome: Progressing   Problem: Skin Integrity: Goal: Risk for impaired skin integrity will decrease Outcome: Progressing

## 2024-02-26 NOTE — Progress Notes (Signed)
 " PROGRESS NOTE    Tracy Meyer  FMW:[id number] DOB: [birth year] DOA: 02/25/2024 PCP: Ransom Other, MD   Brief Narrative: 59 year past medical history significant hypertension, bilateral edema, vitamin D  deficiency, depression presented with elevated blood pressure dyspnea on exertion consistent with flash pulmonary edema due to medication noncompliance.   Patient was evaluated at her PCP office and was noted to have hypoxia on exertion referred to the ED.  Assessment & Plan:   Principal Problem:   DOE (dyspnea on exertion)   1-Dyspnea on exertion Echo: No diastolic dysfunction ejection fraction 70 to 75%, FU with cardiology out patient.  Pulmonary edema ruled out.  Likely in setting of RSV, bronchitis.   RSV positive, acute Bronchitis.  Start Pulmicort , brovana .  Schedule Duo-neb.  Guaifenesin , Tussionex.   HTN; Started on Cozaar .     Estimated body mass index is 48.39 kg/m as calculated from the following:   Height as of this encounter: 5' 6 (1.676 m).   Weight as of this encounter: 136 kg.   DVT prophylaxis: Lovenox  Code Status: Full code Family Communication: Disposition Plan:  Status is: Inpatient Remains inpatient appropriate because: management of acute bronchitis.     Consultants:  None  Procedures:  none  Antimicrobials:    Subjective: She is coughing a lot, report wheezing, dyspnea.    Objective: Vitals:   02/25/24 2036 02/26/24 0003 02/26/24 0405 02/26/24 0824  BP: (!) 172/96 (!) 162/71 (!) 143/74 122/71  Pulse:  92 86 81  Resp:  20  18  Temp: 98.5 F (36.9 C)  98.2 F (36.8 C) 98.1 F (36.7 C)  TempSrc:      SpO2: 92% 97% 96% 96%  Weight:      Height:       No intake or output data in the 24 hours ending 02/26/24 0851 Filed Weights   02/25/24 1239  Weight: 136 kg    Examination:  General exam: Appears calm and comfortable  Respiratory system: Decreased breath sound, sporadic wheezing.  Respiratory effort  normal. Cardiovascular system: S1 & S2 heard, RRR.  Gastrointestinal system: Abdomen is nondistended, soft and nontender. No organomegaly or masses felt. Normal bowel sounds heard. Central nervous system: Alert and oriented. No focal neurological deficits. Extremities: Symmetric 5 x 5 power.   Data Reviewed: I have personally reviewed following labs and imaging studies  CBC: Recent Labs  Lab 02/25/24 1254 02/25/24 1736  WBC 9.1 11.6*  NEUTROABS 3.6  --   HGB 13.7 13.6  HCT 42.6 41.7  MCV 92.8 90.8  PLT 325 336   Basic Metabolic Panel: Recent Labs  Lab 02/25/24 1254 02/25/24 1736  NA 142  --   K 4.2  --   CL 102  --   CO2 27  --   GLUCOSE 123*  --   BUN 9  --   CREATININE 0.92 0.89  CALCIUM 9.2  --    GFR: Estimated Creatinine Clearance: 100.3 mL/min (by C-G formula based on SCr of 0.89 mg/dL). Liver Function Tests: Recent Labs  Lab 02/25/24 1254  AST 30  ALT 21  ALKPHOS 102  BILITOT 0.8  PROT 7.9  ALBUMIN  4.0   No results for input(s): LIPASE, AMYLASE in the last 168 hours. No results for input(s): AMMONIA in the last 168 hours. Coagulation Profile: No results for input(s): INR, PROTIME in the last 168 hours. Cardiac Enzymes: No results for input(s): CKTOTAL, CKMB, CKMBINDEX, TROPONINI in the last 168 hours. BNP (last 3 results) Recent Labs  02/25/24 1254  PROBNP 122.0   HbA1C: Recent Labs    02/25/24 1738  HGBA1C 6.1*   CBG: No results for input(s): GLUCAP in the last 168 hours. Lipid Profile: No results for input(s): CHOL, HDL, LDLCALC, TRIG, CHOLHDL, LDLDIRECT in the last 72 hours. Thyroid  Function Tests: No results for input(s): TSH, T4TOTAL, FREET4, T3FREE, THYROIDAB in the last 72 hours. Anemia Panel: No results for input(s): VITAMINB12, FOLATE, FERRITIN, TIBC, IRON, RETICCTPCT in the last 72 hours. Sepsis Labs: No results for input(s): PROCALCITON, LATICACIDVEN in the last 168  hours.  Recent Results (from the past 240 hours)  Resp panel by RT-PCR (RSV, Flu A&B, Covid) Anterior Nasal Swab     Status: None   Collection Time: 02/25/24 12:43 PM   Specimen: Anterior Nasal Swab  Result Value Ref Range Status   SARS Coronavirus 2 by RT PCR NEGATIVE NEGATIVE Final   Influenza A by PCR NEGATIVE NEGATIVE Final   Influenza B by PCR NEGATIVE NEGATIVE Final    Comment: (NOTE) The Xpert Xpress SARS-CoV-2/FLU/RSV plus assay is intended as an aid in the diagnosis of influenza from Nasopharyngeal swab specimens and should not be used as a sole basis for treatment. Nasal washings and aspirates are unacceptable for Xpert Xpress SARS-CoV-2/FLU/RSV testing.  Fact Sheet for Patients: bloggercourse.com  Fact Sheet for Healthcare Providers: seriousbroker.it  This test is not yet approved or cleared by the [place]  FDA and has been authorized for detection and/or diagnosis of SARS-CoV-2 by FDA under an Emergency Use Authorization (EUA). This EUA will remain in effect (meaning this test can be used) for the duration of the COVID-19 declaration under Section 564(b)(1) of the Act, 21 U.S.C. section 360bbb-3(b)(1), unless the authorization is terminated or revoked.     Resp Syncytial Virus by PCR NEGATIVE NEGATIVE Final    Comment: (NOTE) Fact Sheet for Patients: bloggercourse.com  Fact Sheet for Healthcare Providers: seriousbroker.it  This test is not yet approved or cleared by the [place]  FDA and has been authorized for detection and/or diagnosis of SARS-CoV-2 by FDA under an Emergency Use Authorization (EUA). This EUA will remain in effect (meaning this test can be used) for the duration of the COVID-19 declaration under Section 564(b)(1) of the Act, 21 U.S.C. section 360bbb-3(b)(1), unless the authorization is terminated or revoked.  Performed at [facility], [address], [place], [place] [address]   Respiratory (~20 pathogens) panel by PCR     Status: Abnormal   Collection Time: 02/25/24 12:43 PM   Specimen: Nasopharyngeal Swab; Respiratory  Result Value Ref Range Status   Adenovirus NOT DETECTED NOT DETECTED Final   Coronavirus 229E NOT DETECTED NOT DETECTED Final    Comment: (NOTE) The Coronavirus on the Respiratory Panel, DOES NOT test for the novel  Coronavirus (2019 nCoV)    Coronavirus HKU1 NOT DETECTED NOT DETECTED Final   Coronavirus NL63 NOT DETECTED NOT DETECTED Final   Coronavirus OC43 NOT DETECTED NOT DETECTED Final   Metapneumovirus NOT DETECTED NOT DETECTED Final   Rhinovirus / Enterovirus NOT DETECTED NOT DETECTED Final   Influenza A NOT DETECTED NOT DETECTED Final   Influenza B NOT DETECTED NOT DETECTED Final   Parainfluenza Virus 1 NOT DETECTED NOT DETECTED Final   Parainfluenza Virus 2 NOT DETECTED NOT DETECTED Final   Parainfluenza Virus 3 NOT DETECTED NOT DETECTED Final   Parainfluenza Virus 4 NOT DETECTED NOT DETECTED Final   Respiratory Syncytial Virus DETECTED (A) NOT DETECTED Final   Bordetella pertussis NOT  DETECTED NOT DETECTED Final   Bordetella Parapertussis NOT DETECTED NOT DETECTED Final   Chlamydophila pneumoniae NOT DETECTED NOT DETECTED Final   Mycoplasma pneumoniae NOT DETECTED NOT DETECTED Final    Comment: Performed at [facility], [address], [place], [place] [address]         Radiology Studies: ECHOCARDIOGRAM COMPLETE BUBBLE STUDY Result Date: 02/25/2024    ECHOCARDIOGRAM REPORT   Patient Name:   Satara Virella Date of Exam: 02/25/2024 Medical Rec #:  [id number]       Height:       66.0 in Accession #:    [id number]      Weight:       299.8 lb Date of Birth:  August 26, 1965        BSA:          2.375 m Patient Age:    58 years        BP:           166/98 mmHg Patient Gender: F               HR:           100 bpm. Exam Location:  Inpatient Procedure: 2D Echo (Both Spectral and  Color Flow Doppler were utilized during            procedure). Indications:    dyspnea on exertion  History:        Patient has no prior history of Echocardiogram examinations.                 Signs/Symptoms:Edema.  Sonographer:    Tinnie Barefoot RDCS Referring Phys: GEORGINA BASKET  Sonographer Comments: Patient is obese. Image acquisition challenging due to patient body habitus. IMPRESSIONS  1. Left ventricular ejection fraction, by estimation, is 70 to 75%. The left ventricle has hyperdynamic function. The left ventricle has no regional wall motion abnormalities. Left ventricular diastolic parameters were normal.  2. Right ventricular systolic function is normal. The right ventricular size is normal.  3. The mitral valve is normal in structure. No evidence of mitral valve regurgitation. No evidence of mitral stenosis.  4. The aortic valve is normal in structure. Aortic valve regurgitation is not visualized. No aortic stenosis is present.  5. The inferior vena cava is normal in size with greater than 50% respiratory variability, suggesting right atrial pressure of 3 mmHg. 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 Left ventricular ejection fraction, by estimation, is 70 to 75%. The left ventricle has hyperdynamic function. The left ventricle has no regional wall motion abnormalities. The  left ventricular internal cavity size was normal in size. There is no left ventricular hypertrophy. Left ventricular diastolic parameters were normal. Right Ventricle: The right ventricular size is normal. No increase in right ventricular wall thickness. Right ventricular systolic function is normal. Left Atrium: Left atrial size was normal in size. Right Atrium: Right atrial size was normal in size. Pericardium: There is no evidence of pericardial effusion. Mitral Valve: The mitral valve is normal in structure. No evidence of mitral valve regurgitation. No evidence of mitral valve  stenosis. Tricuspid Valve: The tricuspid valve is normal in structure. Tricuspid valve regurgitation is not demonstrated. No evidence of tricuspid stenosis. Aortic Valve: The aortic valve is normal in structure. Aortic valve regurgitation is not visualized. No aortic stenosis is present. Aortic valve mean gradient measures 11.0 mmHg. Aortic valve peak gradient measures 20.2 mmHg. Pulmonic Valve: The pulmonic valve  was normal in structure. Pulmonic valve regurgitation is not visualized. No evidence of pulmonic stenosis. Aorta: The aortic root is normal in size and structure. Venous: The inferior vena cava is normal in size with greater than 50% respiratory variability, suggesting right atrial pressure of 3 mmHg. IAS/Shunts: No atrial level shunt detected by color flow Doppler. Agitated saline contrast was given intravenously to evaluate for intracardiac shunting.  LEFT VENTRICLE PLAX 2D LVIDd:         3.80 cm   Diastology LVIDs:         2.10 cm   LV e' lateral:   6.74 cm/s LV PW:         1.20 cm   LV E/e' lateral: 13.2 LV IVS:        1.20 cm LVOT diam:     1.90 cm LVOT Area:     2.84 cm LV IVRT:       95 msec  RIGHT VENTRICLE             IVC RV S prime:     21.10 cm/s  IVC diam: 1.50 cm TAPSE (M-mode): 2.8 cm LEFT ATRIUM             Index        RIGHT ATRIUM           Index LA Vol (A2C):   43.6 ml 18.36 ml/m  RA Area:     14.30 cm LA Vol (A4C):   39.6 ml 16.67 ml/m  RA Volume:   31.50 ml  13.26 ml/m LA Biplane Vol: 43.3 ml 18.23 ml/m  AORTIC VALVE AV Vmax:      225.00 cm/s AV Vmean:     153.000 cm/s AV VTI:       0.334 m AV Peak Grad: 20.2 mmHg AV Mean Grad: 11.0 mmHg  AORTA Ao Root diam: 3.10 cm Ao Asc diam:  3.50 cm MV E velocity: 89.30 cm/s MV A velocity: 133.00 cm/s  SHUNTS MV E/A ratio:  0.67         Systemic Diam: 1.90 cm Morene Brownie Electronically signed by Morene Brownie Signature Date/Time: 02/25/2024/6:05:12 PM    Final    DG Chest 2 View Result Date: 02/25/2024 CLINICAL DATA:  Flu like symptoms.  EXAM: CHEST - 2 VIEW COMPARISON:  October 05, 2019 FINDINGS: The heart size and mediastinal contours are within normal limits. Both lungs are clear. The visualized skeletal structures are unremarkable. IMPRESSION: No active cardiopulmonary disease. Electronically Signed   By: Suzen Dials M.D.   On: 02/25/2024 13:22        Scheduled Meds:  enoxaparin  (LOVENOX ) injection  60 mg Subcutaneous Q24H   losartan   25 mg Oral Daily   Continuous Infusions:   LOS: 1 day    Time spent: 35 minutes    Katherin Ramey A Nazar Kuan, MD [facility]   If 7PM-7AM, please contact night-coverage [email or web address]  02/26/2024, 8:51 AM   "

## 2024-02-27 MED ORDER — IPRATROPIUM-ALBUTEROL 0.5-2.5 (3) MG/3ML IN SOLN
3.0000 mL | Freq: Two times a day (BID) | RESPIRATORY_TRACT | Status: AC
  Administered 2024-02-27 – 2024-02-28 (×3): 3 mL via RESPIRATORY_TRACT
  Filled 2024-02-27 (×3): qty 3

## 2024-02-27 MED ORDER — ACETAMINOPHEN 325 MG PO TABS: 650.0000 mg | ORAL_TABLET | Freq: Four times a day (QID) | ORAL | Status: AC | PRN

## 2024-02-27 MED ORDER — IBUPROFEN 200 MG PO TABS: 400.0000 mg | ORAL_TABLET | Freq: Four times a day (QID) | ORAL | Status: AC | PRN

## 2024-02-27 MED ORDER — AMOXICILLIN-POT CLAVULANATE 875-125 MG PO TABS
1.0000 | ORAL_TABLET | Freq: Two times a day (BID) | ORAL | Status: AC
  Administered 2024-02-27 – 2024-02-28 (×4): 1 via ORAL
  Filled 2024-02-27 (×4): qty 1

## 2024-02-27 NOTE — Plan of Care (Signed)
   Problem: Activity: Goal: Risk for activity intolerance will decrease Outcome: Progressing   Problem: Nutrition: Goal: Adequate nutrition will be maintained Outcome: Progressing   Problem: Coping: Goal: Level of anxiety will decrease Outcome: Progressing   Problem: Elimination: Goal: Will not experience complications related to bowel motility Outcome: Progressing Goal: Will not experience complications related to urinary retention Outcome: Progressing

## 2024-02-27 NOTE — Plan of Care (Signed)
  Problem: Education: Goal: Knowledge of General Education information will improve Description: Including pain rating scale, medication(s)/side effects and non-pharmacologic comfort measures Outcome: Progressing   Problem: Health Behavior/Discharge Planning: Goal: Ability to manage health-related needs will improve Outcome: Progressing   Problem: Clinical Measurements: Goal: Ability to maintain clinical measurements within normal limits will improve Outcome: Progressing Goal: Will remain free from infection Outcome: Progressing Goal: Diagnostic test results will improve Outcome: Progressing Goal: Respiratory complications will improve Outcome: Progressing Goal: Cardiovascular complication will be avoided Outcome: Progressing   Problem: Activity: Goal: Risk for activity intolerance will decrease Outcome: Progressing   Problem: Coping: Goal: Level of anxiety will decrease Outcome: Progressing   Problem: Pain Managment: Goal: General experience of comfort will improve and/or be controlled Outcome: Progressing   Problem: Safety: Goal: Ability to remain free from injury will improve Outcome: Progressing   Problem: Skin Integrity: Goal: Risk for impaired skin integrity will decrease Outcome: Progressing

## 2024-02-27 NOTE — Progress Notes (Addendum)
 " PROGRESS NOTE    Tracy Meyer  FMW:[id number] DOB: 05/16/1965 DOA: 02/25/2024 PCP: Ransom Other, MD   Brief Narrative: 58 year past medical history significant hypertension, bilateral edema, vitamin D  deficiency, depression presented with elevated blood pressure dyspnea on exertion consistent with flash pulmonary edema due to medication noncompliance.   Patient was evaluated at her PCP office and was noted to have hypoxia on exertion referred to the ED.  Assessment & Plan:   Principal Problem:   DOE (dyspnea on exertion)   1-Dyspnea on exertion Echo: No diastolic dysfunction ejection fraction 70 to 75%, FU with cardiology out patient.  Pulmonary edema ruled out.  Likely in setting of RSV, bronchitis.  Report some improvement of breathing. Still coughing a lot.  Plan to continue with support care.  RSV positive, acute Bronchitis.  Start Pulmicort , brovana .  Schedule Duo-neb.  Guaifenesin , Tussionex.   HTN; Started on Cozaar .  Better controlled.   Right side lymphadenopathy; start Augmentin .   Obesity class III; needs life style modifications  Estimated body mass index is 48.39 kg/m as calculated from the following:   Height as of this encounter: 5' 6 (1.676 m).   Weight as of this encounter: 136 kg.   DVT prophylaxis: Lovenox  Code Status: Full code Family Communication: Disposition Plan:  Status is: Inpatient Remains inpatient appropriate because: management of acute bronchitis.     Consultants:  None  Procedures:  none  Antimicrobials:    Subjective: Breathing a little better. Still having swelling and pain left side of face, neck. Report persistent cough  Objective: Vitals:   02/26/24 2005 02/26/24 2105 02/27/24 0256 02/27/24 0400  BP: (!) 141/73     Pulse: 64     Resp: 18     Temp: 97.8 F (36.6 C)   98.5 F (36.9 C)  TempSrc:    Tympanic  SpO2: 97% 99% 98% 95%  Weight:      Height:        Intake/Output Summary (Last 24 hours) at  02/27/2024 0723 Last data filed at 02/26/2024 1833 Gross per 24 hour  Intake 300 ml  Output --  Net 300 ml   Filed Weights   02/25/24 1239  Weight: 136 kg    Examination:  General exam:  NAD Respiratory system: Sporadic wheezing.  Cardiovascular system: BS present, soft, nt Central nervous system: Anon focal.  Extremities: Symmetric 5 x 5 power.   Data Reviewed: I have personally reviewed following labs and imaging studies  CBC: Recent Labs  Lab 02/25/24 1254 02/25/24 1736  WBC 9.1 11.6*  NEUTROABS 3.6  --   HGB 13.7 13.6  HCT 42.6 41.7  MCV 92.8 90.8  PLT 325 336   Basic Metabolic Panel: Recent Labs  Lab 02/25/24 1254 02/25/24 1736  NA 142  --   K 4.2  --   CL 102  --   CO2 27  --   GLUCOSE 123*  --   BUN 9  --   CREATININE 0.92 0.89  CALCIUM 9.2  --    GFR: Estimated Creatinine Clearance: 100.3 mL/min (by C-G formula based on SCr of 0.89 mg/dL). Liver Function Tests: Recent Labs  Lab 02/25/24 1254  AST 30  ALT 21  ALKPHOS 102  BILITOT 0.8  PROT 7.9  ALBUMIN  4.0   No results for input(s): LIPASE, AMYLASE in the last 168 hours. No results for input(s): AMMONIA in the last 168 hours. Coagulation Profile: No results for input(s): INR, PROTIME in the last 168 hours.  Cardiac Enzymes: No results for input(s): CKTOTAL, CKMB, CKMBINDEX, TROPONINI in the last 168 hours. BNP (last 3 results) Recent Labs    02/25/24 1254  PROBNP 122.0   HbA1C: Recent Labs    02/25/24 1738  HGBA1C 6.1*   CBG: No results for input(s): GLUCAP in the last 168 hours. Lipid Profile: No results for input(s): CHOL, HDL, LDLCALC, TRIG, CHOLHDL, LDLDIRECT in the last 72 hours. Thyroid  Function Tests: No results for input(s): TSH, T4TOTAL, FREET4, T3FREE, THYROIDAB in the last 72 hours. Anemia Panel: No results for input(s): VITAMINB12, FOLATE, FERRITIN, TIBC, IRON, RETICCTPCT in the last 72 hours. Sepsis Labs: No  results for input(s): PROCALCITON, LATICACIDVEN in the last 168 hours.  Recent Results (from the past 240 hours)  Resp panel by RT-PCR (RSV, Flu A&B, Covid) Anterior Nasal Swab     Status: None   Collection Time: 02/25/24 12:43 PM   Specimen: Anterior Nasal Swab  Result Value Ref Range Status   SARS Coronavirus 2 by RT PCR NEGATIVE NEGATIVE Final   Influenza A by PCR NEGATIVE NEGATIVE Final   Influenza B by PCR NEGATIVE NEGATIVE Final    Comment: (NOTE) The Xpert Xpress SARS-CoV-2/FLU/RSV plus assay is intended as an aid in the diagnosis of influenza from Nasopharyngeal swab specimens and should not be used as a sole basis for treatment. Nasal washings and aspirates are unacceptable for Xpert Xpress SARS-CoV-2/FLU/RSV testing.  Fact Sheet for Patients: bloggercourse.com  Fact Sheet for Healthcare Providers: seriousbroker.it  This test is not yet approved or cleared by the [place]  FDA and has been authorized for detection and/or diagnosis of SARS-CoV-2 by FDA under an Emergency Use Authorization (EUA). This EUA will remain in effect (meaning this test can be used) for the duration of the COVID-19 declaration under Section 564(b)(1) of the Act, 21 U.S.C. section 360bbb-3(b)(1), unless the authorization is terminated or revoked.     Resp Syncytial Virus by PCR NEGATIVE NEGATIVE Final    Comment: (NOTE) Fact Sheet for Patients: bloggercourse.com  Fact Sheet for Healthcare Providers: seriousbroker.it  This test is not yet approved or cleared by the [place]  FDA and has been authorized for detection and/or diagnosis of SARS-CoV-2 by FDA under an Emergency Use Authorization (EUA). This EUA will remain in effect (meaning this test can be used) for the duration of the COVID-19 declaration under Section 564(b)(1) of the Act, 21 U.S.C. section 360bbb-3(b)(1), unless the  authorization is terminated or revoked.  Performed at [facility], [address], [place], [place] [address]   Respiratory (~20 pathogens) panel by PCR     Status: Abnormal   Collection Time: 02/25/24 12:43 PM   Specimen: Nasopharyngeal Swab; Respiratory  Result Value Ref Range Status   Adenovirus NOT DETECTED NOT DETECTED Final   Coronavirus 229E NOT DETECTED NOT DETECTED Final    Comment: (NOTE) The Coronavirus on the Respiratory Panel, DOES NOT test for the novel  Coronavirus (2019 nCoV)    Coronavirus HKU1 NOT DETECTED NOT DETECTED Final   Coronavirus NL63 NOT DETECTED NOT DETECTED Final   Coronavirus OC43 NOT DETECTED NOT DETECTED Final   Metapneumovirus NOT DETECTED NOT DETECTED Final   Rhinovirus / Enterovirus NOT DETECTED NOT DETECTED Final   Influenza A NOT DETECTED NOT DETECTED Final   Influenza B NOT DETECTED NOT DETECTED Final   Parainfluenza Virus 1 NOT DETECTED NOT DETECTED Final   Parainfluenza Virus 2 NOT DETECTED NOT DETECTED Final   Parainfluenza Virus 3 NOT DETECTED NOT DETECTED Final  Parainfluenza Virus 4 NOT DETECTED NOT DETECTED Final   Respiratory Syncytial Virus DETECTED (A) NOT DETECTED Final   Bordetella pertussis NOT DETECTED NOT DETECTED Final   Bordetella Parapertussis NOT DETECTED NOT DETECTED Final   Chlamydophila pneumoniae NOT DETECTED NOT DETECTED Final   Mycoplasma pneumoniae NOT DETECTED NOT DETECTED Final    Comment: Performed at [facility], [address], [place], [place] [address]         Radiology Studies: ECHOCARDIOGRAM COMPLETE BUBBLE STUDY Result Date: 02/25/2024    ECHOCARDIOGRAM REPORT   Patient Name:   Tracy Meyer Date of Exam: 02/25/2024 Medical Rec #:  [id number]       Height:       66.0 in Accession #:    [id number]      Weight:       299.8 lb Date of Birth:  Feb 09, 1965        BSA:          2.375 m Patient Age:    58 years        BP:           166/98 mmHg Patient Gender: F               HR:           100 bpm.  Exam Location:  Inpatient Procedure: 2D Echo (Both Spectral and Color Flow Doppler were utilized during            procedure). Indications:    dyspnea on exertion  History:        Patient has no prior history of Echocardiogram examinations.                 Signs/Symptoms:Edema.  Sonographer:    Tinnie Barefoot RDCS Referring Phys: GEORGINA BASKET  Sonographer Comments: Patient is obese. Image acquisition challenging due to patient body habitus. IMPRESSIONS  1. Left ventricular ejection fraction, by estimation, is 70 to 75%. The left ventricle has hyperdynamic function. The left ventricle has no regional wall motion abnormalities. Left ventricular diastolic parameters were normal.  2. Right ventricular systolic function is normal. The right ventricular size is normal.  3. The mitral valve is normal in structure. No evidence of mitral valve regurgitation. No evidence of mitral stenosis.  4. The aortic valve is normal in structure. Aortic valve regurgitation is not visualized. No aortic stenosis is present.  5. The inferior vena cava is normal in size with greater than 50% respiratory variability, suggesting right atrial pressure of 3 mmHg. 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 Left ventricular ejection fraction, by estimation, is 70 to 75%. The left ventricle has hyperdynamic function. The left ventricle has no regional wall motion abnormalities. The  left ventricular internal cavity size was normal in size. There is no left ventricular hypertrophy. Left ventricular diastolic parameters were normal. Right Ventricle: The right ventricular size is normal. No increase in right ventricular wall thickness. Right ventricular systolic function is normal. Left Atrium: Left atrial size was normal in size. Right Atrium: Right atrial size was normal in size. Pericardium: There is no evidence of pericardial effusion. Mitral Valve: The mitral valve is normal in structure. No  evidence of mitral valve regurgitation. No evidence of mitral valve stenosis. Tricuspid Valve: The tricuspid valve is normal in structure. Tricuspid valve regurgitation is not demonstrated. No evidence of tricuspid stenosis. Aortic Valve: The aortic valve is normal in structure. Aortic valve regurgitation is not visualized. No  aortic stenosis is present. Aortic valve mean gradient measures 11.0 mmHg. Aortic valve peak gradient measures 20.2 mmHg. Pulmonic Valve: The pulmonic valve was normal in structure. Pulmonic valve regurgitation is not visualized. No evidence of pulmonic stenosis. Aorta: The aortic root is normal in size and structure. Venous: The inferior vena cava is normal in size with greater than 50% respiratory variability, suggesting right atrial pressure of 3 mmHg. IAS/Shunts: No atrial level shunt detected by color flow Doppler. Agitated saline contrast was given intravenously to evaluate for intracardiac shunting.  LEFT VENTRICLE PLAX 2D LVIDd:         3.80 cm   Diastology LVIDs:         2.10 cm   LV e' lateral:   6.74 cm/s LV PW:         1.20 cm   LV E/e' lateral: 13.2 LV IVS:        1.20 cm LVOT diam:     1.90 cm LVOT Area:     2.84 cm LV IVRT:       95 msec  RIGHT VENTRICLE             IVC RV S prime:     21.10 cm/s  IVC diam: 1.50 cm TAPSE (M-mode): 2.8 cm LEFT ATRIUM             Index        RIGHT ATRIUM           Index LA Vol (A2C):   43.6 ml 18.36 ml/m  RA Area:     14.30 cm LA Vol (A4C):   39.6 ml 16.67 ml/m  RA Volume:   31.50 ml  13.26 ml/m LA Biplane Vol: 43.3 ml 18.23 ml/m  AORTIC VALVE AV Vmax:      225.00 cm/s AV Vmean:     153.000 cm/s AV VTI:       0.334 m AV Peak Grad: 20.2 mmHg AV Mean Grad: 11.0 mmHg  AORTA Ao Root diam: 3.10 cm Ao Asc diam:  3.50 cm MV E velocity: 89.30 cm/s MV A velocity: 133.00 cm/s  SHUNTS MV E/A ratio:  0.67         Systemic Diam: 1.90 cm Morene Brownie Electronically signed by Morene Brownie Signature Date/Time: 02/25/2024/6:05:12 PM    Final    DG  Chest 2 View Result Date: 02/25/2024 CLINICAL DATA:  Flu like symptoms. EXAM: CHEST - 2 VIEW COMPARISON:  October 05, 2019 FINDINGS: The heart size and mediastinal contours are within normal limits. Both lungs are clear. The visualized skeletal structures are unremarkable. IMPRESSION: No active cardiopulmonary disease. Electronically Signed   By: Suzen Dials M.D.   On: 02/25/2024 13:22        Scheduled Meds:  arformoterol   15 mcg Nebulization BID   budesonide  (PULMICORT ) nebulizer solution  0.25 mg Nebulization BID   enoxaparin  (LOVENOX ) injection  60 mg Subcutaneous Q24H   guaiFENesin   600 mg Oral BID   ipratropium-albuterol   3 mL Nebulization Q6H   losartan   25 mg Oral Daily   pantoprazole   40 mg Oral BID   Continuous Infusions:   LOS: 2 days    Time spent: 35 minutes    Mayson Mcneish A Tyreona Panjwani, MD [facility]   If 7PM-7AM, please contact night-coverage [email or web address]  02/27/2024, 7:23 AM   "

## 2024-02-27 NOTE — Progress Notes (Signed)
 The patient states that she is now experiencing ear pain on both sides. She asked me to look and see if her lymph nodes were swollen. They did not appear to visibly swollen but her jaw up to her sideburns near her ear on the right side was visibly swollen and puffy. Night MD was informed.

## 2024-02-28 ENCOUNTER — Inpatient Hospital Stay (HOSPITAL_COMMUNITY): Payer: Self-pay

## 2024-02-28 LAB — BASIC METABOLIC PANEL WITH GFR
Anion gap: 11 (ref 5–15)
BUN: 12 mg/dL (ref 6–20)
CO2: 26 mmol/L (ref 22–32)
Calcium: 8.8 mg/dL — ABNORMAL LOW (ref 8.9–10.3)
Chloride: 101 mmol/L (ref 98–111)
Creatinine, Ser: 0.88 mg/dL (ref 0.44–1.00)
GFR, Estimated: >60 mL/min
Glucose, Bld: 161 mg/dL — ABNORMAL HIGH (ref 70–99)
Potassium: 3.8 mmol/L (ref 3.5–5.1)
Sodium: 138 mmol/L (ref 135–145)

## 2024-02-28 LAB — MAGNESIUM: Magnesium: 2.1 mg/dL (ref 1.7–2.4)

## 2024-02-28 LAB — GLUCOSE, CAPILLARY
Glucose-Capillary: 113 mg/dL — ABNORMAL HIGH (ref 70–99)
Glucose-Capillary: 136 mg/dL — ABNORMAL HIGH (ref 70–99)

## 2024-02-28 MED ORDER — KETOROLAC TROMETHAMINE 15 MG/ML IJ SOLN
15.0000 mg | Freq: Once | INTRAMUSCULAR | Status: AC
  Administered 2024-02-28: 15 mg via INTRAVENOUS
  Filled 2024-02-28: qty 1

## 2024-02-28 MED ORDER — IOHEXOL 350 MG/ML SOLN
75.0000 mL | Freq: Once | INTRAVENOUS | Status: AC | PRN
  Administered 2024-02-28: 75 mL via INTRAVENOUS

## 2024-02-28 MED ORDER — KETOROLAC TROMETHAMINE 15 MG/ML IJ SOLN
15.0000 mg | Freq: Once | INTRAMUSCULAR | Status: AC | PRN
  Administered 2024-02-28: 15 mg via INTRAVENOUS
  Filled 2024-02-28: qty 1

## 2024-02-28 MED ORDER — METRONIDAZOLE 500 MG PO TABS: 500.0000 mg | ORAL_TABLET | Freq: Two times a day (BID) | ORAL | Status: DC

## 2024-02-28 MED ORDER — IPRATROPIUM-ALBUTEROL 0.5-2.5 (3) MG/3ML IN SOLN: 3.0000 mL | RESPIRATORY_TRACT | Status: AC | PRN

## 2024-02-28 NOTE — Progress Notes (Signed)
 " PROGRESS NOTE    Tracy Meyer  FMW:[id number] DOB: Mar 09, 1965 DOA: 02/25/2024 PCP: Ransom Other, MD   Brief Narrative: 58 year past medical history significant hypertension, bilateral edema, vitamin D  deficiency, depression presented with elevated blood pressure dyspnea on exertion consistent with flash pulmonary edema due to medication noncompliance.   Patient was evaluated at her PCP office and was noted to have hypoxia on exertion referred to the ED.  Assessment & Plan:   Principal Problem:   DOE (dyspnea on exertion)   1-Dyspnea on exertion Echo: No diastolic dysfunction ejection fraction 70 to 75%, FU with cardiology out patient.  Pulmonary edema ruled out.  Likely in setting of RSV, bronchitis.  Report some improvement of breathing. Still coughing a lot.  Plan to continue with support care.  RSV positive, acute Bronchitis.  Start Pulmicort , brovana .  Schedule Duo-neb.  Guaifenesin , Tussionex.  Support care.   Right side neck swelling;  Suspect parotiditis.  Started Augmentin .  Will give one time dose toradol .  Will check CT neck   HTN; Started on Cozaar .  Better controlled.   Right side lymphadenopathy; start Augmentin .   Obesity class III; needs life style modifications  Estimated body mass index is 48.39 kg/m as calculated from the following:   Height as of this encounter: 5' 6 (1.676 m).   Weight as of this encounter: 136 kg.   DVT prophylaxis: Lovenox  Code Status: Full code Family Communication: Disposition Plan:  Status is: Inpatient Remains inpatient appropriate because: management of acute bronchitis.     Consultants:  None  Procedures:  none  Antimicrobials:    Subjective: She report worsening swelling left side neck and ear.  Objective: Vitals:   02/28/24 0459 02/28/24 0738 02/28/24 0827 02/28/24 1114  BP: (!) 149/80 (!) 158/95  (!) 144/71  Pulse: 87 89 89 81  Resp:   20   Temp: 98.7 F (37.1 C) 98.5 F (36.9 C)  98.9  F (37.2 C)  TempSrc:      SpO2: 95% 92% 92% 96%  Weight:      Height:       No intake or output data in the 24 hours ending 02/28/24 1407  Filed Weights   02/25/24 1239  Weight: 136 kg    Examination:  General exam: NAD Respiratory system: Sporadic wheezing Cardiovascular system: BS present, soft, nt Central nervous system:  alert.  Extremities: Symmetric 5 x 5 power.   Data Reviewed: I have personally reviewed following labs and imaging studies  CBC: Recent Labs  Lab 02/25/24 1254 02/25/24 1736  WBC 9.1 11.6*  NEUTROABS 3.6  --   HGB 13.7 13.6  HCT 42.6 41.7  MCV 92.8 90.8  PLT 325 336   Basic Metabolic Panel: Recent Labs  Lab 02/25/24 1254 02/25/24 1736 02/28/24 1048  NA 142  --  138  K 4.2  --  3.8  CL 102  --  101  CO2 27  --  26  GLUCOSE 123*  --  161*  BUN 9  --  12  CREATININE 0.92 0.89 0.88  CALCIUM 9.2  --  8.8*  MG  --   --  2.1   GFR: Estimated Creatinine Clearance: 101.4 mL/min (by C-G formula based on SCr of 0.88 mg/dL). Liver Function Tests: Recent Labs  Lab 02/25/24 1254  AST 30  ALT 21  ALKPHOS 102  BILITOT 0.8  PROT 7.9  ALBUMIN  4.0   No results for input(s): LIPASE, AMYLASE in the last 168 hours. No  results for input(s): AMMONIA in the last 168 hours. Coagulation Profile: No results for input(s): INR, PROTIME in the last 168 hours. Cardiac Enzymes: No results for input(s): CKTOTAL, CKMB, CKMBINDEX, TROPONINI in the last 168 hours. BNP (last 3 results) Recent Labs    02/25/24 1254  PROBNP 122.0   HbA1C: Recent Labs    02/25/24 1738  HGBA1C 6.1*   CBG: Recent Labs  Lab 02/28/24 0737 02/28/24 1113  GLUCAP 113* 136*   Lipid Profile: No results for input(s): CHOL, HDL, LDLCALC, TRIG, CHOLHDL, LDLDIRECT in the last 72 hours. Thyroid  Function Tests: No results for input(s): TSH, T4TOTAL, FREET4, T3FREE, THYROIDAB in the last 72 hours. Anemia Panel: No results for  input(s): VITAMINB12, FOLATE, FERRITIN, TIBC, IRON, RETICCTPCT in the last 72 hours. Sepsis Labs: No results for input(s): PROCALCITON, LATICACIDVEN in the last 168 hours.  Recent Results (from the past 240 hours)  Resp panel by RT-PCR (RSV, Flu A&B, Covid) Anterior Nasal Swab     Status: None   Collection Time: 02/25/24 12:43 PM   Specimen: Anterior Nasal Swab  Result Value Ref Range Status   SARS Coronavirus 2 by RT PCR NEGATIVE NEGATIVE Final   Influenza A by PCR NEGATIVE NEGATIVE Final   Influenza B by PCR NEGATIVE NEGATIVE Final    Comment: (NOTE) The Xpert Xpress SARS-CoV-2/FLU/RSV plus assay is intended as an aid in the diagnosis of influenza from Nasopharyngeal swab specimens and should not be used as a sole basis for treatment. Nasal washings and aspirates are unacceptable for Xpert Xpress SARS-CoV-2/FLU/RSV testing.  Fact Sheet for Patients: bloggercourse.com  Fact Sheet for Healthcare Providers: seriousbroker.it  This test is not yet approved or cleared by the [place]  FDA and has been authorized for detection and/or diagnosis of SARS-CoV-2 by FDA under an Emergency Use Authorization (EUA). This EUA will remain in effect (meaning this test can be used) for the duration of the COVID-19 declaration under Section 564(b)(1) of the Act, 21 U.S.C. section 360bbb-3(b)(1), unless the authorization is terminated or revoked.     Resp Syncytial Virus by PCR NEGATIVE NEGATIVE Final    Comment: (NOTE) Fact Sheet for Patients: bloggercourse.com  Fact Sheet for Healthcare Providers: seriousbroker.it  This test is not yet approved or cleared by the [place]  FDA and has been authorized for detection and/or diagnosis of SARS-CoV-2 by FDA under an Emergency Use Authorization (EUA). This EUA will remain in effect (meaning this test can be used) for the  duration of the COVID-19 declaration under Section 564(b)(1) of the Act, 21 U.S.C. section 360bbb-3(b)(1), unless the authorization is terminated or revoked.  Performed at [facility], [address] [place], [place] [address]   Respiratory (~20 pathogens) panel by PCR     Status: Abnormal   Collection Time: 02/25/24 12:43 PM   Specimen: Nasopharyngeal Swab; Respiratory  Result Value Ref Range Status   Adenovirus NOT DETECTED NOT DETECTED Final   Coronavirus 229E NOT DETECTED NOT DETECTED Final    Comment: (NOTE) The Coronavirus on the Respiratory Panel, DOES NOT test for the novel  Coronavirus (2019 nCoV)    Coronavirus HKU1 NOT DETECTED NOT DETECTED Final   Coronavirus NL63 NOT DETECTED NOT DETECTED Final   Coronavirus OC43 NOT DETECTED NOT DETECTED Final   Metapneumovirus NOT DETECTED NOT DETECTED Final   Rhinovirus / Enterovirus NOT DETECTED NOT DETECTED Final   Influenza A NOT DETECTED NOT DETECTED Final   Influenza B NOT DETECTED NOT DETECTED Final   Parainfluenza Virus 1  NOT DETECTED NOT DETECTED Final   Parainfluenza Virus 2 NOT DETECTED NOT DETECTED Final   Parainfluenza Virus 3 NOT DETECTED NOT DETECTED Final   Parainfluenza Virus 4 NOT DETECTED NOT DETECTED Final   Respiratory Syncytial Virus DETECTED (A) NOT DETECTED Final   Bordetella pertussis NOT DETECTED NOT DETECTED Final   Bordetella Parapertussis NOT DETECTED NOT DETECTED Final   Chlamydophila pneumoniae NOT DETECTED NOT DETECTED Final   Mycoplasma pneumoniae NOT DETECTED NOT DETECTED Final    Comment: Performed at [facility], [address] [place], [place] [address]         Radiology Studies: No results found.       Scheduled Meds:  amoxicillin -clavulanate  1 tablet Oral Q12H   arformoterol   15 mcg Nebulization BID   budesonide  (PULMICORT ) nebulizer solution  0.25 mg Nebulization BID   enoxaparin  (LOVENOX ) injection  60 mg Subcutaneous Q24H   guaiFENesin   600 mg Oral BID    ipratropium-albuterol   3 mL Nebulization BID   ketorolac   15 mg Intravenous Once   losartan   25 mg Oral Daily   pantoprazole   40 mg Oral BID   Continuous Infusions:   LOS: 3 days    Time spent: 35 minutes    Harith Mccadden A Rachid Parham, MD [facility]   If 7PM-7AM, please contact night-coverage [email or web address]  02/28/2024, 2:07 PM   "

## 2024-02-28 NOTE — Progress Notes (Incomplete)
 [facility] night cross cover note:   I was notified by the patient's RN  ***  pt. is having swelling with pain on rt. neck, she had toradol  IV one dose earlier and she said it was very helpful, she was wondering if she could get order for one at bedtime ***  hi Michael. not a problem . I just ordered an additional one time prn dose of iv toradol  for her. ***   Eva Pore, DO Hospitalist

## 2024-02-28 NOTE — Care Management (Signed)
 Transition of Care [facility]) - Inpatient Brief Assessment   Patient Details  Name: Tracy Meyer MRN: [id number] Date of Birth: 06/17/1965  Transition of Care [facility]) CM/SW Contact:    Corean JAYSON Canary, RN Phone Number: 02/28/2024, 5:05 PM   Clinical Narrative:  59 year old with history of HPTN,  presented with elevated BP and dyspnea on exertion.was hypoxic at Md office and sent to ED.+ RSV  Lymphadenopathy, on ABX  Will follow, watch for oxygen need  Transition of Care Asessment: Insurance and Status: Selfpay Patient has primary care physician: Yes Home environment has been reviewed: [facility] Prior level of function:: Independent Prior/Current Home Services: No current home services Social Drivers of Health Review: SDOH reviewed no interventions necessary Readmission risk has been reviewed: Yes Transition of care needs: no transition of care needs at this time

## 2024-02-28 NOTE — Plan of Care (Signed)
